# Patient Record
Sex: Male | Born: 1970 | Race: White | Hispanic: No | Marital: Married | Smoking: Former smoker
Health system: Southern US, Community
[De-identification: ages and names within clinical notes are randomized; demographics above are authoritative.]

## PROBLEM LIST (undated history)

## (undated) DIAGNOSIS — I10 Essential (primary) hypertension: Secondary | ICD-10-CM

## (undated) DIAGNOSIS — F419 Anxiety disorder, unspecified: Secondary | ICD-10-CM

## (undated) DIAGNOSIS — M1990 Unspecified osteoarthritis, unspecified site: Secondary | ICD-10-CM

## (undated) DIAGNOSIS — E785 Hyperlipidemia, unspecified: Secondary | ICD-10-CM

## (undated) DIAGNOSIS — G4730 Sleep apnea, unspecified: Secondary | ICD-10-CM

## (undated) DIAGNOSIS — F909 Attention-deficit hyperactivity disorder, unspecified type: Secondary | ICD-10-CM

## (undated) DIAGNOSIS — F32A Depression, unspecified: Secondary | ICD-10-CM

## (undated) HISTORY — DX: Essential (primary) hypertension: I10

## (undated) HISTORY — PX: APPENDECTOMY: SHX54

## (undated) HISTORY — DX: Unspecified osteoarthritis, unspecified site: M19.90

## (undated) HISTORY — DX: Sleep apnea, unspecified: G47.30

## (undated) HISTORY — DX: Hyperlipidemia, unspecified: E78.5

## (undated) HISTORY — DX: Depression, unspecified: F32.A

## (undated) HISTORY — DX: Anxiety disorder, unspecified: F41.9

## (undated) HISTORY — DX: Attention-deficit hyperactivity disorder, unspecified type: F90.9

## (undated) HISTORY — PX: OTHER SURGICAL HISTORY: SHX169

---

## 2008-10-15 HISTORY — PX: MENISCUS REPAIR: SHX5179

## 2012-02-26 ENCOUNTER — Ambulatory Visit (INDEPENDENT_AMBULATORY_CARE_PROVIDER_SITE_OTHER): Payer: BC Managed Care – PPO | Admitting: Family Medicine

## 2012-02-26 ENCOUNTER — Encounter: Payer: Self-pay | Admitting: Family Medicine

## 2012-02-26 VITALS — BP 130/86 | HR 97 | Ht 72.0 in | Wt 274.0 lb

## 2012-02-26 DIAGNOSIS — F411 Generalized anxiety disorder: Secondary | ICD-10-CM

## 2012-02-26 DIAGNOSIS — F419 Anxiety disorder, unspecified: Secondary | ICD-10-CM

## 2012-02-26 DIAGNOSIS — IMO0001 Reserved for inherently not codable concepts without codable children: Secondary | ICD-10-CM

## 2012-02-26 DIAGNOSIS — R030 Elevated blood-pressure reading, without diagnosis of hypertension: Secondary | ICD-10-CM

## 2012-02-26 DIAGNOSIS — I10 Essential (primary) hypertension: Secondary | ICD-10-CM | POA: Insufficient documentation

## 2012-02-26 MED ORDER — SERTRALINE HCL 50 MG PO TABS: 50.0000 mg | ORAL_TABLET | Freq: Every day | ORAL | Status: DC

## 2012-02-26 NOTE — Patient Instructions (Addendum)
Sertraline - Start 1/2 tab daily for one week.  Then increase to whole tab. DASH Diet The DASH diet stands for "Dietary Approaches to Stop Hypertension." It is a healthy eating plan that has been shown to reduce high blood pressure (hypertension) in as little as 14 days, while also possibly providing other significant health benefits. These other health benefits include reducing the risk of breast cancer after menopause and reducing the risk of type 2 diabetes, heart disease, colon cancer, and stroke. Health benefits also include weight loss and slowing kidney failure in patients with chronic kidney disease.   DIET GUIDELINES  Limit salt (sodium). Your diet should contain less than 1500 mg of sodium daily.   Limit refined or processed carbohydrates. Your diet should include mostly whole grains. Desserts and added sugars should be used sparingly.   Include small amounts of heart-healthy fats. These types of fats include nuts, oils, and tub margarine. Limit saturated and trans fats. These fats have been shown to be harmful in the body.  CHOOSING FOODS   The following food groups are based on a 2000 calorie diet. See your Registered Dietitian for individual calorie needs. Grains and Grain Products (6 to 8 servings daily)  Eat More Often: Whole-wheat bread, brown rice, whole-grain or wheat pasta, quinoa, popcorn without added fat or salt (air popped).   Eat Less Often: White bread, white pasta, white rice, cornbread.  Vegetables (4 to 5 servings daily)  Eat More Often: Fresh, frozen, and canned vegetables. Vegetables may be raw, steamed, roasted, or grilled with a minimal amount of fat.   Eat Less Often/Avoid: Creamed or fried vegetables. Vegetables in a cheese sauce.  Fruit (4 to 5 servings daily)  Eat More Often: All fresh, canned (in natural juice), or frozen fruits. Dried fruits without added sugar. One hundred percent fruit juice ( cup [237 mL] daily).   Eat Less Often: Dried fruits with  added sugar. Canned fruit in light or heavy syrup.  [employer], Fish, and Poultry (2 servings or less daily. One serving is 3 to 4 oz [85-114 g]).  Eat More Often: Ninety percent or leaner ground beef, tenderloin, sirloin. Round cuts of beef, chicken breast, [place] breast. All fish. Grill, bake, or broil your meat. Nothing should be fried.   Eat Less Often/Avoid: Fatty cuts of meat, [place], or chicken leg, thigh, or wing. Fried cuts of meat or fish.  Dairy (2 to 3 servings)  Eat More Often: Low-fat or fat-free milk, low-fat plain or light yogurt, reduced-fat or part-skim cheese.   Eat Less Often/Avoid: Milk (whole, 2%, skim, or chocolate). Whole milk yogurt. Full-fat cheeses.  Nuts, Seeds, and Legumes (4 to 5 servings per week)  Eat More Often: All without added salt.   Eat Less Often/Avoid: Salted nuts and seeds, canned beans with added salt.  Fats and Sweets (limited)  Eat More Often: Vegetable oils, tub margarines without trans fats, sugar-free gelatin. Mayonnaise and salad dressings.   Eat Less Often/Avoid: Coconut oils, palm oils, butter, stick margarine, cream, half and half, cookies, candy, pie.  FOR MORE INFORMATION The Dash Diet Eating Plan: www.dashdiet.org Document Released: 09/20/2011 Document Reviewed: 09/10/2011 [facility] Patient Information 2012 [place], [place].

## 2012-02-26 NOTE — Progress Notes (Signed)
Subjective:    Patient ID: Stephen Orozco, male    DOB: Mar 11, 1971, 41 y.o.   MRN: [id number]  HPI Had concerns over BP. Had high BP for a period of time and was on meds and it came down and finally got better.  Had a CPE at work.  BP was 116/82.  Then went to PT and felt fine and rechecked BP 187/110 on the way out.  Waited 5 min and DBP was 100.  Then 30 min later dropped to 98.  Had one episode of CP and went to ED and it was anxiety about in 2009, o/w not recent CP or SOB.  Not working out regularly. occ goes on hikes. He admits his diet is poor. He's not getting any regular exercise. Though, he feels he does eat a low-salt diet.  Anxiety - Uses Buspar prn for this.  Used to be on fluoxetine, but stopped it after he felt he was gaining weight on it.  Off now and has been more irritable.  Used to have panic attacks in college. His family has encouraged him to come see the doctor.  Review of Systems  Constitutional: Negative for fever, diaphoresis and unexpected weight change.       Weakness  HENT: Negative for hearing loss, rhinorrhea, sneezing and tinnitus.   Eyes: Negative for visual disturbance.  Respiratory: Negative for cough and wheezing.   Cardiovascular: Negative for chest pain and palpitations.  Gastrointestinal: Negative for nausea, vomiting, diarrhea and blood in stool.  Genitourinary: Negative for dysuria and discharge.       + nausea, vomiting , diarrhea  Musculoskeletal: Positive for myalgias and arthralgias.  Skin: Negative for rash.  Neurological: Negative for headaches.  Hematological: Negative for adenopathy.  Psychiatric/Behavioral: Positive for dysphoric mood. Negative for sleep disturbance. The patient is not nervous/anxious.    BP 130/86  Pulse 97  Ht 6' (1.829 m)  Wt 274 lb (124.286 kg)  BMI 37.16 kg/m2    Allergies not on file  Past Medical History  Diagnosis Date  . Hypertension   . Hyperlipidemia     Past Surgical History  Procedure Date  .  Appendectomy   . Medial malleolar fixation     History   Social History  . Marital Status: Married    Spouse Name: N/A    Number of Children: N/A  . Years of Education: N/A   Occupational History  . Not on file.   Social History Main Topics  . Smoking status: Current Some Day Smoker -- 0.0 packs/day for 10 years    Types: Cigarettes  . Smokeless tobacco: Not on file  . Alcohol Use: 1.0 - 1.5 oz/week    2-3 drink(s) per week  . Drug Use: Not on file  . Sexually Active: Not on file   Other Topics Concern  . Not on file   Social History Narrative  . No narrative on file    Family History  Problem Relation Age of Onset  . Alcohol abuse Father   . Cancer    . Heart attack    . Depression    . Diabetes Father   . Hyperlipidemia Father   . Hypertension Father     Outpatient Encounter Prescriptions as of 02/26/2012  Medication Sig Dispense Refill  . busPIRone (BUSPAR) 15 MG tablet Take 15 mg by mouth as needed.      . Multiple Vitamin (MULTIVITAMIN) tablet Take 1 tablet by mouth daily.      . sertraline (  ZOLOFT) 50 MG tablet Take 1 tablet (50 mg total) by mouth daily.  30 tablet  1          Objective:   Physical Exam  Constitutional: He is oriented to person, place, and time. He appears well-developed and well-nourished.  HENT:  Head: Normocephalic and atraumatic.  Cardiovascular: Normal rate, regular rhythm and normal heart sounds.        No carotid bruits. Radial pulses 2+ bilat.   Pulmonary/Chest: Effort normal and breath sounds normal.  Musculoskeletal: He exhibits no edema.  Neurological: He is alert and oriented to person, place, and time.  Skin: Skin is warm and dry.  Psychiatric: He has a normal mood and affect. His behavior is normal.          Assessment & Plan:  Elevated BP - Work on diet and exercise. Will schedule for a stress test.  May just be deconditioning vs. underlying heart disease. Discussed could start a BP med or just  Monitor it  and work on diet and exericse.  F/U in 1 months. Patient agrees to care plan. BP is at goal today. Work on low salt diet. H.O on DASH diet given.   Anxiety - Discussed options. Would like to try sertraline. F/U in 4 weeks. GAD-7 score 9. PHQ-9 score of 15. Discussed potential SE. Call if any concerns.

## 2012-02-29 LAB — COMPLETE METABOLIC PANEL WITH GFR
AST: 32 U/L (ref 0–37)
Albumin: 4.8 g/dL (ref 3.5–5.2)
Alkaline Phosphatase: 56 U/L (ref 39–117)
BUN: 15 mg/dL (ref 6–23)
Potassium: 4.7 mEq/L (ref 3.5–5.3)
Sodium: 140 mEq/L (ref 135–145)

## 2012-02-29 LAB — LIPID PANEL
Cholesterol: 273 mg/dL — ABNORMAL HIGH (ref 0–200)
LDL Cholesterol: 186 mg/dL — ABNORMAL HIGH (ref 0–99)
Total CHOL/HDL Ratio: 7.6 Ratio
VLDL: 51 mg/dL — ABNORMAL HIGH (ref 0–40)

## 2012-03-02 ENCOUNTER — Other Ambulatory Visit: Payer: Self-pay | Admitting: Family Medicine

## 2012-03-02 MED ORDER — PRAVASTATIN SODIUM 40 MG PO TABS: 40.0000 mg | ORAL_TABLET | Freq: Every day | ORAL | Status: DC

## 2012-04-22 ENCOUNTER — Encounter: Payer: Self-pay | Admitting: Family Medicine

## 2012-04-22 ENCOUNTER — Ambulatory Visit (INDEPENDENT_AMBULATORY_CARE_PROVIDER_SITE_OTHER): Payer: BC Managed Care – PPO | Admitting: Family Medicine

## 2012-04-22 VITALS — BP 137/94 | HR 79 | Ht 72.0 in | Wt 269.0 lb

## 2012-04-22 DIAGNOSIS — Z23 Encounter for immunization: Secondary | ICD-10-CM

## 2012-04-22 DIAGNOSIS — I10 Essential (primary) hypertension: Secondary | ICD-10-CM

## 2012-04-22 DIAGNOSIS — F419 Anxiety disorder, unspecified: Secondary | ICD-10-CM

## 2012-04-22 DIAGNOSIS — E669 Obesity, unspecified: Secondary | ICD-10-CM

## 2012-04-22 DIAGNOSIS — F411 Generalized anxiety disorder: Secondary | ICD-10-CM

## 2012-04-22 DIAGNOSIS — IMO0001 Reserved for inherently not codable concepts without codable children: Secondary | ICD-10-CM

## 2012-04-22 DIAGNOSIS — J019 Acute sinusitis, unspecified: Secondary | ICD-10-CM

## 2012-04-22 DIAGNOSIS — E785 Hyperlipidemia, unspecified: Secondary | ICD-10-CM

## 2012-04-22 MED ORDER — LISINOPRIL 10 MG PO TABS: 10.0000 mg | ORAL_TABLET | Freq: Every day | ORAL | Status: DC

## 2012-04-22 MED ORDER — AMOXICILLIN 875 MG PO TABS: 875.0000 mg | ORAL_TABLET | Freq: Two times a day (BID) | ORAL | Status: AC

## 2012-04-22 MED ORDER — SERTRALINE HCL 100 MG PO TABS: 100.0000 mg | ORAL_TABLET | Freq: Every day | ORAL | Status: DC

## 2012-04-22 NOTE — Progress Notes (Signed)
  Subjective:    Patient ID: Stephen Orozco, male    DOB: [birth year], 41 y.o.   MRN: [id number]  HPI HTN- Has been working out regularly.  Has lost 5 lbs. Says was really working on the diet.  Says admits didn't  Stick to it over this last holiday weekend.  No CP or SOB.   2-3 days of HA adn sinus pressure on the left side of head. No cough.  No fever.  Sneezing.  Constant HA for 2 days.  Took Tylenol multi-sxs.  Says helped a little.  Not sleeping well. Hx of seasonal allergies. Not on any allergy meds. No worsening sxs.   Anxiety - Says not sure healping much .  No S.E.  he says his family members have told him such as well. Otherwise he is tolerating it well. He still complains of feeling irritable and easily.   Review of Systems     Objective:   Physical Exam  Constitutional: He is oriented to person, place, and time. He appears well-developed and well-nourished.  HENT:  Head: Normocephalic and atraumatic.  Right Ear: External ear normal.  Left Ear: External ear normal.  Nose: Nose normal.  Mouth/Throat: Oropharynx is clear and moist.       TMs and canals are clear. Tender over the left forehead adn left maxillary.   Eyes: Conjunctivae and EOM are normal. Pupils are equal, round, and reactive to light.  Neck: Neck supple. No thyromegaly present.  Cardiovascular: Normal rate and normal heart sounds.   Pulmonary/Chest: Effort normal and breath sounds normal.  Lymphadenopathy:    He has no cervical adenopathy.  Neurological: He is alert and oriented to person, place, and time.  Skin: Skin is warm and dry.  Psychiatric: He has a normal mood and affect.          Assessment & Plan:  HTN - new diagnosis. He says he is already reviewed the DASH diet and says his prematurity following that. That he would like additional information on dietary plan. Start lisinopril 10 mg. 1 potential side effects. Followup in 4-6 weeks to make sure well controlled and doing well. We will check a  BMP at that point in time.  Acute sinusitis - Will tx with amox.  Call if not better in one week. Cont symptomtatic care. Make sure to start anti histamine for allergies as well.   Anxiety -GAD-7 score of 9.  Discussed options.  Will increase sertraline to 100mg  daily. F/U in 6 weeks. If nto at goal consider changing meds.   Obesity - he has lost 4 pounds and is on fantastic. Continue work on regular exercise and diet. 11 him to continue to lose weight by the time I  see him back.  Hyperlipidemia-tolerating.  No well side effects or myalgias. Given lab slip today for the next couple weeks to recheck FLP.   Tdap given today.

## 2012-04-22 NOTE — Patient Instructions (Addendum)
Sinusitis Sinuses are air pockets within the bones of your face. The growth of bacteria within a sinus leads to infection. The infection prevents the sinuses from draining. This infection is called sinusitis. SYMPTOMS   There will be different areas of pain depending on which sinuses have become infected.  The maxillary sinuses often produce pain beneath the eyes.   Frontal sinusitis may cause pain in the middle of the forehead and above the eyes.  Other problems (symptoms) include:  Toothaches.   Colored, pus-like (purulent) drainage from the nose.   Swelling, warmth, and tenderness over the sinus areas may be signs of infection.  TREATMENT   Sinusitis is most often determined by an exam.X-rays may be taken. If x-rays have been taken, make sure you obtain your results or find out how you are to obtain them. Your caregiver may give you medications (antibiotics). These are medications that will help kill the bacteria causing the infection. You may also be given a medication (decongestant) that helps to reduce sinus swelling.   HOME CARE INSTRUCTIONS    Only take over-the-counter or prescription medicines for pain, discomfort, or fever as directed by your caregiver.   Drink extra fluids. Fluids help thin the mucus so your sinuses can drain more easily.   Applying either moist heat or ice packs to the sinus areas may help relieve discomfort.   Use saline nasal sprays to help moisten your sinuses. The sprays can be found at your local drugstore.  SEEK IMMEDIATE MEDICAL CARE IF:  You have a fever.   You have increasing pain, severe headaches, or toothache.   You have nausea, vomiting, or drowsiness.   You develop unusual swelling around the face or trouble seeing.  MAKE SURE YOU:    Understand these instructions.   Will watch your condition.   Will get help right away if you are not doing well or get worse.  Document Released: 10/01/2005 Document Revised: 09/20/2011 Document  Reviewed: 04/30/2007 [employer] Patient Information 2012 [employer].1800 Calorie Diabetic Diet The 1800 calorie diabetic diet is designed for eating up to 1800 calories each day. Following this diet and making healthy meal choices can help improve overall health. It controls blood glucose (sugar) levels, and it can also help lower blood pressure and cholesterol. SERVING SIZES Measuring foods and serving sizes helps to make sure you are getting the right amount of food. The list below tells how big or small some common serving sizes are:  1 oz.........4 stacked dice.   3 oz........Marland KitchenDeck of cards.   1 tsp.......Marland KitchenTip of little finger.   1 tbs......Marland KitchenMarland KitchenThumb.   2 tbs.......Marland KitchenGolf ball.    cup......Marland KitchenHalf of a fist.   1 cup.......Marland KitchenA fist.  GUIDELINES FOR CHOOSING FOODS The goal of this diet is to eat a variety of foods and limit calories to 1800 each day. This can be done by choosing foods that are low in calories and fat. The diet also suggests eating small amounts of food frequently. Doing this helps control your blood glucose levels so they do not get too high or too low. Each meal or snack may include a protein food source to help you feel more satisfied. Try to eat about the same amount of food around the same time each day. This includes weekend days, travel days, and days off work. Space your meals about 4 to 5 hours apart, and add a snack between them, if you wish.   For example, a daily food plan could include breakfast, a morning  snack, lunch, dinner, and an evening snack. Healthy meals and snacks have different types of foods, including whole grains, vegetables, fruits, lean meats, poultry, fish, and dairy products. As you plan your meals, select a variety of foods. Choose from the bread and starch, vegetable, fruit, dairy, and meat/protein groups. Examples of foods from each group are listed below with their suggested serving sizes. Use measuring cups and spoons to become familiar with  what a healthy portion looks like. Bread and Starch Each serving equals 15 grams of carbohydrates.  1 slice bread.    bagel.    cup cold cereal (unsweetened).    cup hot cereal or mashed potatoes.   1 small potato (size of a computer mouse).   ? cup cooked pasta or rice.    English muffin.   1 cup broth-based soup.   3 cups of popcorn.   4 to 6 whole-wheat crackers.    cup cooked beans, peas, or corn.  Vegetables Each serving equals 5 grams of carbohydrates.   cup cooked vegetables.   1 cup raw vegetables.    cup tomato or vegetable juice.  Fruit Each serving equals 15 grams of carbohydrates.  1 small apple or orange.   1  cup watermelon or strawberries.    cup applesauce (no sugar added).   2 tbs raisins.    banana.    cup canned fruit, packed in water or in its own juice.    cup unsweetened fruit juice.  Dairy Each serving equals 12 to 15 grams of carbohydrates.  1 cup fat-free milk.   6 oz artificially sweetened yogurt or plain yogurt.   1 cup low-fat buttermilk.   1 cup soy milk.   1 cup almond milk.  Meat/Protein  1 large egg.   2 to 3 oz meat, poultry, or fish.    cup low-fat cottage cheese.   1 tbs peanut butter.   1 oz low-fat cheese.    cup tuna, packed in water.    cup tofu.  Fat  1 tsp oil.   1 tsp trans-fat-free margarine.   1 tsp butter.   1 tsp mayonnaise.   2 tbs avocado.   1 tbs salad dressing.   1 tbs cream cheese.   2 tbs sour cream.  SAMPLE 1800 CALORIE DIET PLAN Breakfast   cup unsweetened cereal (1 carb serving).   1 cup fat-free milk (1 carb serving).   1 slice whole-wheat toast (1 carb serving).    small banana (1 carb serving).   1 scrambled egg.   1 tsp trans-fat-free margarine.  Lunch  Tuna sandwich.   2 slices whole-wheat bread (2 carb servings).    cup canned tuna in water, drained.   1 tbs reduced fat mayonnaise.   1 stalk celery, chopped.   2 slices  tomato.   1 lettuce leaf.   1 cup carrot sticks.   24 to 30 seedless grapes (2 carb servings).   6 oz light yogurt (1 carb serving).  Afternoon Snack  3 graham cracker squares (1 carb serving).   1 cup fat-free milk (1 carb serving).   1 tbs peanut butter.  Dinner  3 oz salmon, broiled with 1 tsp oil.   1 cup mashed potatoes (2 carb servings) with 1 tsp trans-fat-free margarine.   1 cup fresh or frozen green beans.   1 cup steamed asparagus.   1 cup fat-free milk (1 carb serving).  Evening Snack  3 cups of air-popped popcorn (1  carb serving).   2 tbs Parmesan cheese.  Meal Plan You can use this worksheet to help you make a daily meal plan based on the 1800 calorie diabetic diet suggestions. If you are using this plan to help you control your blood glucose, you may interchange carbohydrate-containing foods (dairy, starches, and fruits). Select a variety of fresh foods of varying colors and flavors. The total amount of carbohydrate in your meals or snacks is more important than making sure you include all of the food groups every time you eat. Choose from the approximate amount of the following foods to build your day's meals:  8 Starches.   4 Vegetables.   3 Fruits.   2 Dairy.   6 to 7 oz Meat/Protein.   Up to 4 Fats.  Your dietician can use this worksheet to help you decide how many servings and which types of foods are right for you. BREAKFAST Food Group and Servings / Food Choice Starches _______________________________________________________ Dairy __________________________________________________________ Fruit ___________________________________________________________ Meat/Protein ____________________________________________________ Fat ____________________________________________________________ LUNCH Food Group and Servings / Food Choice Starch _________________________________________________________ Meat/Protein  ___________________________________________________ Vegetables _____________________________________________________ Fruit __________________________________________________________ Dairy __________________________________________________________ Fat ____________________________________________________________ Aura Fey Food Group and Servings / Food Choice Starch ________________________________________________________ Meat/Protein ___________________________________________________ Fruit __________________________________________________________ Dairy __________________________________________________________ Laural Golden Food Group and Servings / Food Choice Starches _______________________________________________________ Meat/Protein ___________________________________________________ Dairy __________________________________________________________ Vegetable ______________________________________________________ Fruit ___________________________________________________________ Fat ____________________________________________________________ Lollie Sails Food Group and Servings / Food Choice Fruit __________________________________________________________ Meat/Protein ___________________________________________________ Dairy __________________________________________________________ Starch _________________________________________________________ DAILY TOTALS Starches _________________________ Vegetables _______________________ Fruits ____________________________ Dairy ____________________________ Meat/Protein_____________________ Fats _____________________________ Document Released: 04/23/2005 Document Revised: 09/20/2011 Document Reviewed: 08/17/2011 [employer] Patient Information 2012 [place], [place].

## 2012-04-29 ENCOUNTER — Other Ambulatory Visit: Payer: Self-pay | Admitting: Family Medicine

## 2012-06-03 ENCOUNTER — Ambulatory Visit (INDEPENDENT_AMBULATORY_CARE_PROVIDER_SITE_OTHER): Payer: BC Managed Care – PPO | Admitting: Family Medicine

## 2012-06-03 ENCOUNTER — Encounter: Payer: Self-pay | Admitting: Family Medicine

## 2012-06-03 VITALS — BP 122/86 | HR 80 | Ht 72.0 in | Wt 264.0 lb

## 2012-06-03 DIAGNOSIS — Z23 Encounter for immunization: Secondary | ICD-10-CM

## 2012-06-03 DIAGNOSIS — I10 Essential (primary) hypertension: Secondary | ICD-10-CM

## 2012-06-03 DIAGNOSIS — Z720 Tobacco use: Secondary | ICD-10-CM

## 2012-06-03 DIAGNOSIS — F17200 Nicotine dependence, unspecified, uncomplicated: Secondary | ICD-10-CM

## 2012-06-03 DIAGNOSIS — F411 Generalized anxiety disorder: Secondary | ICD-10-CM

## 2012-06-03 NOTE — Progress Notes (Signed)
  Subjective:    Patient ID: Stephen Orozco, male    DOB: [birth year], 41 y.o.   MRN: [id number]  HPI GAD - Doing well. Has noticed some  Dec sexual interested. Says still feels having food cravings. Has had long hours of work this week and it has been stressful.    HTN- no CP or SOB.  Occ smokes but not daily.   Review of Systems     Objective:   Physical Exam  Constitutional: He is oriented to person, place, and time. He appears well-developed and well-nourished.  HENT:  Head: Normocephalic and atraumatic.  Cardiovascular: Normal rate, regular rhythm and normal heart sounds.   Pulmonary/Chest: Effort normal and breath sounds normal.  Neurological: He is alert and oriented to person, place, and time.  Skin: Skin is warm and dry.  Psychiatric: He has a normal mood and affect. His behavior is normal.          Assessment & Plan:  GAD - GAD score of 6 today.  Doing well overall except for sexual S.E and in appetite. He says he would like to continue to work on it for 2-3 months and then we will regroup and decide if going to change the med or not.    HTN - BP well controlled. Continue current regimen.   Obesity - Has lost 5 more lbs. Great work. Keep it up.   Tob abuse - recommend cessation. Given pneumonia vaccine today.

## 2012-06-03 NOTE — Addendum Note (Signed)
Addended by: Wyline Beady on: 06/03/2012 09:46 AM   Modules accepted: Orders

## 2012-06-23 ENCOUNTER — Other Ambulatory Visit: Payer: Self-pay | Admitting: Family Medicine

## 2012-06-30 ENCOUNTER — Other Ambulatory Visit: Payer: Self-pay | Admitting: Family Medicine

## 2012-07-08 ENCOUNTER — Other Ambulatory Visit: Payer: Self-pay | Admitting: Family Medicine

## 2012-07-28 ENCOUNTER — Other Ambulatory Visit: Payer: Self-pay | Admitting: Family Medicine

## 2012-09-08 ENCOUNTER — Encounter: Payer: Self-pay | Admitting: Family Medicine

## 2012-09-08 ENCOUNTER — Ambulatory Visit (INDEPENDENT_AMBULATORY_CARE_PROVIDER_SITE_OTHER): Payer: BC Managed Care – PPO | Admitting: Family Medicine

## 2012-09-08 VITALS — BP 128/89 | HR 80 | Ht 72.0 in | Wt 263.0 lb

## 2012-09-08 DIAGNOSIS — F411 Generalized anxiety disorder: Secondary | ICD-10-CM

## 2012-09-08 MED ORDER — BUPROPION HCL ER (XL) 150 MG PO TB24: 150.0000 mg | ORAL_TABLET | Freq: Every day | ORAL | Status: DC

## 2012-09-08 NOTE — Patient Instructions (Signed)
Cut sertraline in half. Take half tab daily for one week.  The quarter tab daily for one week then stop and start the wellbutrin.

## 2012-09-08 NOTE — Progress Notes (Signed)
  Subjective:    Patient ID: Boots Valera, male    DOB: [birth year], 41 y.o.   MRN: [id number]  HPI Anxiety - did well on wellbutrin in the possible.  Says still using sertraline and cuasing some sexual S.E.   Thinks may have had the flu last week. He had a fever for 3 days myalgias and cough. He's feeling completely better now. Cough is resolved. Fevers resolved. He stay out of work.   Review of Systems     Objective:   Physical Exam  Constitutional: He is oriented to person, place, and time. He appears well-developed and well-nourished.  HENT:  Head: Normocephalic and atraumatic.  Cardiovascular: Normal rate, regular rhythm and normal heart sounds.   Pulmonary/Chest: Effort normal and breath sounds normal.  Neurological: He is alert and oriented to person, place, and time.  Skin: Skin is warm and dry.  Psychiatric: He has a normal mood and affect. His behavior is normal.          Assessment & Plan:  Anxiety - GAD 7 score of 6. Doing well overall except for sexual side effects. At this point time we'll wean down sertraline and start Wellbutrin. He says he took in college and did well on it. This will hopefully resolve his sexual side effect concerns. Followup in 8 weeks.  URI-resolved. Lung exam is normal today.

## 2012-11-03 ENCOUNTER — Ambulatory Visit (INDEPENDENT_AMBULATORY_CARE_PROVIDER_SITE_OTHER): Payer: BC Managed Care – PPO | Admitting: Family Medicine

## 2012-11-03 ENCOUNTER — Encounter: Payer: Self-pay | Admitting: Family Medicine

## 2012-11-03 VITALS — BP 120/83 | HR 82 | Resp 16 | Wt 266.0 lb

## 2012-11-03 DIAGNOSIS — F411 Generalized anxiety disorder: Secondary | ICD-10-CM

## 2012-11-03 DIAGNOSIS — E785 Hyperlipidemia, unspecified: Secondary | ICD-10-CM

## 2012-11-03 DIAGNOSIS — I10 Essential (primary) hypertension: Secondary | ICD-10-CM

## 2012-11-03 LAB — LIPID PANEL
HDL: 36 mg/dL — ABNORMAL LOW (ref >39)
LDL Cholesterol: 129 mg/dL — ABNORMAL HIGH (ref 0–99)

## 2012-11-03 LAB — COMPLETE METABOLIC PANEL WITH GFR
CO2: 27 mEq/L (ref 19–32)
Calcium: 9.7 mg/dL (ref 8.4–10.5)
Chloride: 102 mEq/L (ref 96–112)
GFR, Est African American: >89 mL/min
GFR, Est Non African American: >89 mL/min
Glucose, Bld: 102 mg/dL — ABNORMAL HIGH (ref 70–99)
Sodium: 138 mEq/L (ref 135–145)
Total Bilirubin: 0.4 mg/dL (ref 0.3–1.2)
Total Protein: 7.4 g/dL (ref 6.0–8.3)

## 2012-11-03 MED ORDER — BUPROPION HCL ER (XL) 300 MG PO TB24: 300.0000 mg | ORAL_TABLET | ORAL | Status: DC

## 2012-11-03 MED ORDER — PRAVASTATIN SODIUM 40 MG PO TABS: 40.0000 mg | ORAL_TABLET | Freq: Every day | ORAL | Status: DC

## 2012-11-03 NOTE — Progress Notes (Signed)
  Subjective:    Patient ID: Stephen Orozco, male    DOB: 1971/03/07, 42 y.o.   MRN: [id number]  HPI  Anxiety - Felt more emotional over the first 2 weeks with the Wellbutrin. He noticed he was tearful at movies [employer]. Says does feel better on it. Overall. He was able to get through the anniversary of his father's death fairly well. He admits he still been very anxious and irritable at times. He occasionally uses BuSpar on a when necessary basis but not daily.  HTN  -  Pt denies chest pain, SOB, dizziness, or heart palpitations.  Denies medication side effects.  Stopped his lisinopril a month ago.   Hyperlipidemia-he ran out of his statin as well. He denies any side effects on the medication. Review of Systems     Objective:   Physical Exam  Constitutional: He is oriented to person, place, and time. He appears well-developed and well-nourished.  HENT:  Head: Normocephalic and atraumatic.  Cardiovascular: Normal rate, regular rhythm and normal heart sounds.   Pulmonary/Chest: Effort normal and breath sounds normal.  Neurological: He is alert and oriented to person, place, and time.  Skin: Skin is warm and dry.  Psychiatric: He has a normal mood and affect. His behavior is normal.          Assessment & Plan:  Anxiety - GAD-7 score of 9 . We discussed her options including sitting with the Wellbutrin increasing his dose or possibly changing to a different medication. He had taken Wellbutrin 10+ years ago had gone well on it at one point in time. After much discussion we decided to Increase Wellbutrin to 300mg .  F/u in 4 weeks. If still feels med is not working well, will change.   HTN - well controlled off medications. Continue current regimen. Overdue for CMP and lipids.He didn't go last time. We will follow his pressures over time off the medication. Assessment and sure getting back on exercise and diet routine and working on weight loss. Exercise will help his anxiety as  well.  HyperLipidemia-he's been off his statin for a month or so as well. Encouraged him to restart it. The prescription sent to pharmacy.

## 2012-12-01 ENCOUNTER — Encounter: Payer: Self-pay | Admitting: Family Medicine

## 2012-12-01 ENCOUNTER — Ambulatory Visit (INDEPENDENT_AMBULATORY_CARE_PROVIDER_SITE_OTHER): Payer: BC Managed Care – PPO | Admitting: Family Medicine

## 2012-12-01 VITALS — BP 117/80 | HR 86 | Ht 72.0 in | Wt 269.0 lb

## 2012-12-01 DIAGNOSIS — Z23 Encounter for immunization: Secondary | ICD-10-CM

## 2012-12-01 DIAGNOSIS — F17200 Nicotine dependence, unspecified, uncomplicated: Secondary | ICD-10-CM

## 2012-12-01 DIAGNOSIS — F411 Generalized anxiety disorder: Secondary | ICD-10-CM

## 2012-12-01 DIAGNOSIS — Z720 Tobacco use: Secondary | ICD-10-CM

## 2012-12-01 NOTE — Progress Notes (Signed)
  Subjective:    Patient ID: Stephen Orozco, male    DOB: 1971/03/01, 42 y.o.   MRN: [id number]  HPI GAD- Says felt rough when we first bumped his dose on the Wellbutrin.  He says his depression sxs are better. Still occ irritable.  He has been starting to work out.  Has been gaining some weight too and not sure if from the medication.  Sleep is fair.  Still smoking but has cut back.  Tolerating current medication well.    Review of Systems     Objective:   Physical Exam  Constitutional: He is oriented to person, place, and time. He appears well-developed and well-nourished.  HENT:  Head: Normocephalic and atraumatic.  Cardiovascular: Normal rate, regular rhythm and normal heart sounds.   Pulmonary/Chest: Effort normal and breath sounds normal.  Neurological: He is alert and oriented to person, place, and time.  Skin: Skin is warm and dry.  Psychiatric: He has a normal mood and affect. His behavior is normal.          Assessment & Plan:  GAD - GAD- 7 score of 5. Improved so will continue current regimen.  Continue to work on regular exercise and healthy diet.    Tob abuse - Says has been able to cut back on the Wellbutrin.

## 2013-02-18 ENCOUNTER — Other Ambulatory Visit: Payer: Self-pay | Admitting: Family Medicine

## 2013-03-02 ENCOUNTER — Encounter: Payer: Self-pay | Admitting: Family Medicine

## 2013-03-02 ENCOUNTER — Ambulatory Visit (INDEPENDENT_AMBULATORY_CARE_PROVIDER_SITE_OTHER): Payer: BC Managed Care – PPO | Admitting: Family Medicine

## 2013-03-02 VITALS — BP 125/95 | HR 89 | Wt 266.0 lb

## 2013-03-02 DIAGNOSIS — F411 Generalized anxiety disorder: Secondary | ICD-10-CM

## 2013-03-02 DIAGNOSIS — I10 Essential (primary) hypertension: Secondary | ICD-10-CM

## 2013-03-02 NOTE — Progress Notes (Signed)
       Subjective:    Patient ID: Stephen Orozco, male    DOB: Apr 22, 1971, 42 y.o.   MRN: [id number]  HPI Anxiety - doing well.  Has been tyring to get to the gym.  Has been trying to get home earlier and spending more time with the kids. Tolreating medications well w/out any S.E.    HTN -  Pt denies chest pain, SOB, dizziness, or heart palpitations.  Taking meds as directed w/o problems.  Denies medication side effects.  No change in salt intake. He's been trying to get to the gym some. He really overall has been trying to eat healthy but still having some difficulty with weight loss.    Review of Systems     Objective:   Physical Exam  Constitutional: He is oriented to person, place, and time. He appears well-developed and well-nourished.  HENT:  Head: Normocephalic and atraumatic.  Cardiovascular: Normal rate, regular rhythm and normal heart sounds.   Pulmonary/Chest: Effort normal and breath sounds normal.  Neurological: He is alert and oriented to person, place, and time.  Skin: Skin is warm and dry.  Psychiatric: He has a normal mood and affect. His behavior is normal.          Assessment & Plan:  Anxiety - GAD- 7 score of 3.  Happy with current regimen.  F/U in 6 months.     HTN - Uncontrolled today. He is asymptomatic. His blood pressures if looks fantastic the last couple of office visits until today. He says they have been running a little bit higher the last 2 weeks home. I would like him to continue to monitor them for about 2 more weeks and if they don't come back down he will call the office back and we'll start him on a blood pressure medication. We can always restart what he was on previously.

## 2013-03-02 NOTE — Patient Instructions (Signed)
Call me if your blood pressure is still running high in 2 weeks.

## 2013-05-01 ENCOUNTER — Telehealth: Payer: Self-pay | Admitting: *Deleted

## 2013-05-01 MED ORDER — LISINOPRIL 10 MG PO TABS: ORAL_TABLET | ORAL | Status: DC

## 2013-05-01 NOTE — Telephone Encounter (Signed)
rx sent

## 2013-05-01 NOTE — Telephone Encounter (Signed)
Pt left a message stating that for a while his bp looked great but over the last couple weeks its's been steadily increasing & he would like [name] to send over an rx for whatever he was taking before.

## 2013-05-04 NOTE — Telephone Encounter (Signed)
LMOM notifying pt of rx. 

## 2013-05-28 ENCOUNTER — Other Ambulatory Visit: Payer: Self-pay | Admitting: Family Medicine

## 2013-05-28 ENCOUNTER — Ambulatory Visit (INDEPENDENT_AMBULATORY_CARE_PROVIDER_SITE_OTHER): Payer: BC Managed Care – PPO | Admitting: Family Medicine

## 2013-05-28 ENCOUNTER — Ambulatory Visit (INDEPENDENT_AMBULATORY_CARE_PROVIDER_SITE_OTHER): Payer: BC Managed Care – PPO

## 2013-05-28 ENCOUNTER — Encounter: Payer: Self-pay | Admitting: Family Medicine

## 2013-05-28 VITALS — BP 125/87 | HR 89 | Wt 252.0 lb

## 2013-05-28 DIAGNOSIS — M79609 Pain in unspecified limb: Secondary | ICD-10-CM

## 2013-05-28 DIAGNOSIS — M25571 Pain in right ankle and joints of right foot: Secondary | ICD-10-CM

## 2013-05-28 DIAGNOSIS — M79671 Pain in right foot: Secondary | ICD-10-CM

## 2013-05-28 DIAGNOSIS — M25579 Pain in unspecified ankle and joints of unspecified foot: Secondary | ICD-10-CM

## 2013-05-28 DIAGNOSIS — M7989 Other specified soft tissue disorders: Secondary | ICD-10-CM

## 2013-05-28 NOTE — Progress Notes (Signed)
Subjective:    Patient ID: Stephen Orozco, male    DOB: 09/21/1971, 42 y.o.   MRN: [id number]  HPI About 2-3 weeks ago right ankle started to get sore.  But no swelling at that time. Then traveled and it got worse.  Then started swelling.  Most pain is over the lateral foot, instep, heel, and occ the toes. He has hardward in his left ankle from an ankle fusion. No trauma or twisting it.  Then 2 days ago it has felt better.  Dad with hx of gout. Swelling has gone down now.  More painful to walk on it. Feels better at rest. No prior history of gout or other type of arthritis.   Review of Systems  BP 125/87  Pulse 89  Wt 252 lb (114.306 kg)  BMI 34.17 kg/m2    No Known Allergies  Past Medical History  Diagnosis Date  . Hypertension   . Hyperlipidemia     Past Surgical History  Procedure Laterality Date  . Appendectomy    . Medial malleolar fixation      History   Social History  . Marital Status: Married    Spouse Name: N/A    Number of Children: 2  . Years of Education: N/A   Occupational History  . unemployed.     Social History Main Topics  . Smoking status: Current Every Day Smoker -- 0.30 packs/day for 10 years    Types: Cigarettes  . Smokeless tobacco: Not on file  . Alcohol Use: 1 - 1.5 oz/week    2-3 drink(s) per week  . Drug Use: Not on file  . Sexual Activity: Not on file   Other Topics Concern  . Not on file   Social History Narrative   1 half caff drink daily. No regular exercise. Wife is a Engineer, civil (consulting). He is unemployed right now.     Family History  Problem Relation Age of Onset  . Alcohol abuse Father   . Cancer    . Heart attack    . Depression    . Diabetes Father   . Hyperlipidemia Father   . Hypertension Father     Outpatient Encounter Prescriptions as of 05/28/2013  Medication Sig Dispense Refill  . buPROPion (WELLBUTRIN XL) 300 MG 24 hr tablet TAKE 1 TABLET (300 MG TOTAL) BY MOUTH EVERY MORNING. BRAND ONLY.  30 tablet  2  . busPIRone  (BUSPAR) 15 MG tablet Take 15 mg by mouth as needed.      Marland Kitchen lisinopril (PRINIVIL,ZESTRIL) 10 MG tablet TAKE 1 TABLET (10 MG TOTAL) BY MOUTH DAILY.  90 tablet  1  . pravastatin (PRAVACHOL) 40 MG tablet Take 1 tablet (40 mg total) by mouth daily.  30 tablet  5   No facility-administered encounter medications on file as of 05/28/2013.          Objective:   Physical Exam  Constitutional: He appears well-developed and well-nourished.  Musculoskeletal:  Right ankle with decreased flexion and extension. He is nontender over the lateral or feel malleolus. He is actually tender over the distal fourth metatarsal. Toes with normal flexion and extension. Dorsal pedal pulse 2+. He does have what looks like some petechiae over the middle portion of the foot just anterior to the medial malleolus. Scars look well healed.          Assessment & Plan:  Right foot pain-certainly could have been a mild strain or injury. I would like to get an x-ray to rule out  a possible fracture though there was no trauma. The description of his significant swelling I think a fracture is a possibility and he has pont tenderness along the distal 4th MT. There is also a family history of gout so I do think it's reasonable to check a uric acid level and a CBC.Will call with resutls, Rest, ice, elevated adn can use NSAID prn.  It seems to have gotten much better in the last couple of days.

## 2013-05-29 LAB — URIC ACID: Uric Acid, Serum: 6.6 mg/dL (ref 4.0–7.8)

## 2013-05-29 LAB — CBC
HCT: 43.7 % (ref 39.0–52.0)
Hemoglobin: 15.4 g/dL (ref 13.0–17.0)
MCHC: 35.2 g/dL (ref 30.0–36.0)
RBC: 4.93 MIL/uL (ref 4.22–5.81)
WBC: 7.6 10*3/uL (ref 4.0–10.5)

## 2013-06-01 ENCOUNTER — Other Ambulatory Visit: Payer: Self-pay | Admitting: Family Medicine

## 2013-07-06 ENCOUNTER — Other Ambulatory Visit: Payer: Self-pay | Admitting: Family Medicine

## 2013-10-05 ENCOUNTER — Other Ambulatory Visit: Payer: Self-pay | Admitting: *Deleted

## 2013-10-05 MED ORDER — BUPROPION HCL ER (XL) 300 MG PO TB24: ORAL_TABLET | ORAL | Status: DC

## 2013-10-05 NOTE — Telephone Encounter (Signed)
Pt calls and LM on Tonya's VM needing a refill on the Bupropion XL- said pharmacy would not fill med.  Called pt back and LMOM that I had refilled the med for 1 month supply and that he needed to call office back to schedule OV with MD for followup on this med. Barry Dienes, LPN

## 2013-11-20 ENCOUNTER — Ambulatory Visit (INDEPENDENT_AMBULATORY_CARE_PROVIDER_SITE_OTHER): Payer: BC Managed Care – PPO | Admitting: Physician Assistant

## 2013-11-20 ENCOUNTER — Encounter: Payer: Self-pay | Admitting: Physician Assistant

## 2013-11-20 VITALS — BP 128/83 | HR 96 | Wt 273.0 lb

## 2013-11-20 DIAGNOSIS — E785 Hyperlipidemia, unspecified: Secondary | ICD-10-CM

## 2013-11-20 DIAGNOSIS — F419 Anxiety disorder, unspecified: Secondary | ICD-10-CM

## 2013-11-20 DIAGNOSIS — F17200 Nicotine dependence, unspecified, uncomplicated: Secondary | ICD-10-CM

## 2013-11-20 DIAGNOSIS — Z79899 Other long term (current) drug therapy: Secondary | ICD-10-CM

## 2013-11-20 DIAGNOSIS — E669 Obesity, unspecified: Secondary | ICD-10-CM

## 2013-11-20 DIAGNOSIS — I10 Essential (primary) hypertension: Secondary | ICD-10-CM

## 2013-11-20 DIAGNOSIS — F411 Generalized anxiety disorder: Secondary | ICD-10-CM

## 2013-11-20 DIAGNOSIS — IMO0001 Reserved for inherently not codable concepts without codable children: Secondary | ICD-10-CM

## 2013-11-20 DIAGNOSIS — Z720 Tobacco use: Secondary | ICD-10-CM | POA: Insufficient documentation

## 2013-11-20 MED ORDER — BUPROPION HCL ER (XL) 300 MG PO TB24: ORAL_TABLET | ORAL | Status: DC

## 2013-11-20 MED ORDER — LISINOPRIL 10 MG PO TABS: ORAL_TABLET | ORAL | Status: DC

## 2013-11-20 NOTE — Progress Notes (Signed)
   Subjective:    Patient ID: Stephen Orozco, male    DOB: 24-May-1971, 43 y.o.   MRN: [id number]  HPI Patient is a 43 year old male who presents to the clinic to followup on medications and get refills.  Hypertension-patient denies any chest pains, palpitations, vision changes, headache. Patient has not had any problem taking medication. Patient is very compliant with medication. Patient denies any regular exercise.  Hyperlipidemia-we'll not check cholesterol since starting pravastatin. He takes pravastatin daily without any difficulty. He denies making any diet changes with low fat or exercise.  Anxiety-patient does feel rather controlled on Wellbutrin 300 mg daily. Patient occasionally takes BuSpar at bedtime but not. Often. He feels like his pressure anger are much better controlled.   Review of Systems     Objective:   Physical Exam  Constitutional: He is oriented to person, place, and time. He appears well-developed and well-nourished.  HENT:  Head: Normocephalic and atraumatic.  Cardiovascular: Normal rate, regular rhythm and normal heart sounds.   Pulmonary/Chest: Effort normal and breath sounds normal.  Neurological: He is alert and oriented to person, place, and time.  Skin: Skin is warm and dry.  Psychiatric: He has a normal mood and affect. His behavior is normal.          Assessment & Plan:  Hypertension-refilled lisinopril for 6 months. He is controlled today. Discussed the importance of weight loss and regular exercise and decreasing blood pressure. Encouraged low salt diet.  Hyperlipidemia-did not refill his statin today and he'll recheck lipid level. He has not had a lipid levels since starting medication. Denies any side effects.  Anxiety-refilled Wellbutrin 300 mg today in office. Followup in one year.   Obesity-discussed with patient importance of overall help make it with weight control. Patient reports he is not able to exercise 2 to working 50+ hours a  week and having 3 children. Discussed making better diet choices and patient is aware and will try to implement. Patient declines any nutritionist appointment at this time.  Tobacco abuse-quitting smoking was encouraged today. Patient reports not ready.

## 2013-11-24 ENCOUNTER — Ambulatory Visit (INDEPENDENT_AMBULATORY_CARE_PROVIDER_SITE_OTHER): Payer: BC Managed Care – PPO | Admitting: Family Medicine

## 2013-11-24 ENCOUNTER — Encounter: Payer: Self-pay | Admitting: Family Medicine

## 2013-11-24 VITALS — BP 131/94 | HR 110 | Temp 100.7°F | Wt 273.0 lb

## 2013-11-24 DIAGNOSIS — J101 Influenza due to other identified influenza virus with other respiratory manifestations: Secondary | ICD-10-CM

## 2013-11-24 DIAGNOSIS — J111 Influenza due to unidentified influenza virus with other respiratory manifestations: Secondary | ICD-10-CM

## 2013-11-24 LAB — POCT INFLUENZA A/B
Influenza A, POC: POSITIVE
Influenza B, POC: NEGATIVE

## 2013-11-24 MED ORDER — OSELTAMIVIR PHOSPHATE 75 MG PO CAPS: 75.0000 mg | ORAL_CAPSULE | Freq: Two times a day (BID) | ORAL | Status: DC

## 2013-11-24 NOTE — Progress Notes (Signed)
CC: Stephen Orozco is a 44 y.o. male is here for Fever   Subjective: HPI:  Complains of a fever that began at 2 AM this morning abruptly and has been hovering around 102.3 however did improve with taking Tylenol only for 4 hours. Symptoms were accompanied and have been accompanied by diffuse myalgias, nonproductive cough, facial pressure and hard to localize headache. Overall symptoms are moderate in severity. Said it's been persistent since onset.  He denies confusion, photophobia, neck pain, back pain, chest pain, shortness of breath, nor any GI disturbance   Review Of Systems Outlined In HPI  Past Medical History  Diagnosis Date  . Hypertension   . Hyperlipidemia     Past Surgical History  Procedure Laterality Date  . Appendectomy    . Medial malleolar fixation Right     Ankle   Family History  Problem Relation Age of Onset  . Alcohol abuse Father   . Cancer    . Heart attack    . Depression    . Diabetes Father   . Hyperlipidemia Father   . Hypertension Father   . Gout Father     History   Social History  . Marital Status: Married    Spouse Name: N/A    Number of Children: 2  . Years of Education: N/A   Occupational History  . unemployed.     Social History Main Topics  . Smoking status: Current Every Day Smoker -- 0.30 packs/day for 10 years    Types: Cigarettes  . Smokeless tobacco: Not on file  . Alcohol Use: 1 - 1.5 oz/week    2-3 drink(s) per week  . Drug Use: Not on file  . Sexual Activity: Not on file   Other Topics Concern  . Not on file   Social History Narrative   1 half caff drink daily. No regular exercise. Wife is a Marine scientist. He is unemployed right now.      Objective: BP 131/94  Pulse 110  Temp(Src) 100.7 F (38.2 C) (Oral)  Wt 273 lb (123.832 kg)  General: Alert and Oriented, No Acute Distress, appears mildly fatigued however cracking jokes HEENT: Pupils equal, round, reactive to light. Mild peripheral conjunctival injection  bilaterally.  External ears unremarkable, canals clear with intact TMs with appropriate landmarks.  Middle ear appears open without effusion. Pink inferior turbinates.  Moist mucous membranes, pharynx without inflammation nor lesions.  Neck supple without palpable lymphadenopathy nor abnormal masses. Lungs: Clear to auscultation bilaterally, no wheezing/ronchi/rales.  Comfortable work of breathing. Good air movement. Cardiac: Regular rate and rhythm. Normal S1/S2.  No murmurs, rubs, nor gallops.   Extremities: No peripheral edema.  Strong peripheral pulses.  Mental Status: No depression, anxiety, nor agitation. Skin: Warm and dry.  Assessment & Plan: Stephen Orozco was seen today for fever.  Diagnoses and associated orders for this visit:  Influenza A - POCT Influenza A/B  Other Orders - oseltamivir (TAMIFLU) 75 MG capsule; Take 1 capsule (75 mg total) by mouth 2 (two) times daily.    Rapid flu is positive for a, start Tamiflu, encouraged use Alka-Seltzer cold and sinus as needed for symptoms focus more on fluids rather than foods. Wife is pregnant and I strongly encouraged him to contact their OB/GYN immediately to discuss prophylactic therapy   Return if symptoms worsen or fail to improve.

## 2013-11-27 LAB — COMPLETE METABOLIC PANEL WITH GFR
ALBUMIN: 4.2 g/dL (ref 3.5–5.2)
ALT: 37 U/L (ref 0–53)
AST: 20 U/L (ref 0–37)
Alkaline Phosphatase: 47 U/L (ref 39–117)
BUN: 12 mg/dL (ref 6–23)
CALCIUM: 9.4 mg/dL (ref 8.4–10.5)
CHLORIDE: 102 meq/L (ref 96–112)
CO2: 28 meq/L (ref 19–32)
Creat: 0.91 mg/dL (ref 0.50–1.35)
GFR, Est Non African American: >89 mL/min
Glucose, Bld: 93 mg/dL (ref 70–99)
POTASSIUM: 4.3 meq/L (ref 3.5–5.3)
Sodium: 141 mEq/L (ref 135–145)
Total Bilirubin: 0.4 mg/dL (ref 0.2–1.2)
Total Protein: 7.1 g/dL (ref 6.0–8.3)

## 2013-11-27 LAB — LIPID PANEL
CHOLESTEROL: 202 mg/dL — AB (ref 0–200)
HDL: 33 mg/dL — ABNORMAL LOW (ref >39)
LDL CALC: 130 mg/dL — AB (ref 0–99)
TRIGLYCERIDES: 193 mg/dL — AB (ref <150)
Total CHOL/HDL Ratio: 6.1 Ratio
VLDL: 39 mg/dL (ref 0–40)

## 2013-11-30 ENCOUNTER — Other Ambulatory Visit: Payer: Self-pay | Admitting: *Deleted

## 2013-11-30 MED ORDER — PRAVASTATIN SODIUM 40 MG PO TABS: ORAL_TABLET | ORAL | Status: DC

## 2014-01-10 ENCOUNTER — Other Ambulatory Visit: Payer: Self-pay | Admitting: Family Medicine

## 2014-03-03 ENCOUNTER — Other Ambulatory Visit: Payer: Self-pay | Admitting: Family Medicine

## 2014-04-01 ENCOUNTER — Telehealth: Payer: Self-pay | Admitting: *Deleted

## 2014-04-01 DIAGNOSIS — Z3009 Encounter for other general counseling and advice on contraception: Secondary | ICD-10-CM

## 2014-04-01 NOTE — Telephone Encounter (Signed)
I really like Dr. Risa Grill at [facility]

## 2014-04-01 NOTE — Telephone Encounter (Signed)
Wife called stating pt wants a referral to a urologist for a vasectomy. Please advise if we can send a referral. Oscar La, LPN

## 2014-04-01 NOTE — Telephone Encounter (Signed)
[facility] for referral. See if he has a preference.

## 2014-04-01 NOTE — Telephone Encounter (Signed)
They don't have a preference. States any one you think is good.  Oscar La, LPN

## 2014-04-02 NOTE — Telephone Encounter (Signed)
Wife informed.  Oscar La, LPN

## 2014-04-25 ENCOUNTER — Other Ambulatory Visit: Payer: Self-pay | Admitting: Family Medicine

## 2014-05-21 ENCOUNTER — Ambulatory Visit: Payer: BC Managed Care – PPO | Admitting: Family Medicine

## 2014-05-25 ENCOUNTER — Other Ambulatory Visit: Payer: Self-pay | Admitting: Family Medicine

## 2014-06-04 ENCOUNTER — Encounter: Payer: Self-pay | Admitting: Family Medicine

## 2014-06-04 ENCOUNTER — Ambulatory Visit (INDEPENDENT_AMBULATORY_CARE_PROVIDER_SITE_OTHER): Payer: BC Managed Care – PPO | Admitting: Family Medicine

## 2014-06-04 VITALS — BP 116/79 | HR 85 | Ht 72.0 in | Wt 263.0 lb

## 2014-06-04 DIAGNOSIS — I10 Essential (primary) hypertension: Secondary | ICD-10-CM

## 2014-06-04 DIAGNOSIS — Z23 Encounter for immunization: Secondary | ICD-10-CM | POA: Diagnosis not present

## 2014-06-04 DIAGNOSIS — F419 Anxiety disorder, unspecified: Secondary | ICD-10-CM

## 2014-06-04 DIAGNOSIS — F411 Generalized anxiety disorder: Secondary | ICD-10-CM

## 2014-06-04 LAB — BASIC METABOLIC PANEL WITH GFR
BUN: 14 mg/dL (ref 6–23)
CALCIUM: 10.1 mg/dL (ref 8.4–10.5)
CHLORIDE: 102 meq/L (ref 96–112)
CO2: 29 meq/L (ref 19–32)
CREATININE: 0.94 mg/dL (ref 0.50–1.35)
GFR, Est African American: >89 mL/min
GFR, Est Non African American: >89 mL/min
Glucose, Bld: 106 mg/dL — ABNORMAL HIGH (ref 70–99)
Potassium: 4.7 mEq/L (ref 3.5–5.3)
Sodium: 139 mEq/L (ref 135–145)

## 2014-06-04 MED ORDER — BUPROPION HCL ER (XL) 300 MG PO TB24: ORAL_TABLET | ORAL | Status: DC

## 2014-06-04 NOTE — Assessment & Plan Note (Signed)
Well-controlled on current regimen. Happy with it. They have a new baby and things have been going well. Followup in 6 months.

## 2014-06-04 NOTE — Assessment & Plan Note (Signed)
Well-controlled on current regimen.  Follow-up in 6 months. 

## 2014-06-04 NOTE — Progress Notes (Signed)
   Subjective:    Patient ID: Stephen Orozco, male    DOB: 07-31-1971, 43 y.o.   MRN: [id number]  Hypertension   Anxiety - overall he is doing very well with his regimen. He would like to get the generic Wellbutrin because the brand only was quite costly. He feels like he is less irritable.  Hypertension- Pt denies chest pain, SOB, dizziness, or heart palpitations.  Taking meds as directed w/o problems.  Denies medication side effects.     Review of Systems     Objective:   Physical Exam  Constitutional: He is oriented to person, place, and time. He appears well-developed and well-nourished.  HENT:  Head: Normocephalic and atraumatic.  Cardiovascular: Normal rate, regular rhythm and normal heart sounds.   Pulmonary/Chest: Effort normal and breath sounds normal.  Neurological: He is alert and oriented to person, place, and time.  Skin: Skin is warm and dry.  Psychiatric: He has a normal mood and affect. His behavior is normal.          Assessment & Plan:  Vaccine given today.

## 2014-06-06 NOTE — Progress Notes (Signed)
Quick Note:  All labs are normal. ______ 

## 2014-12-03 ENCOUNTER — Encounter: Payer: Self-pay | Admitting: Family Medicine

## 2014-12-03 ENCOUNTER — Ambulatory Visit (INDEPENDENT_AMBULATORY_CARE_PROVIDER_SITE_OTHER): Payer: BC Managed Care – PPO | Admitting: Family Medicine

## 2014-12-03 VITALS — BP 121/85 | HR 86 | Ht 72.0 in | Wt 253.0 lb

## 2014-12-03 DIAGNOSIS — L723 Sebaceous cyst: Secondary | ICD-10-CM

## 2014-12-03 DIAGNOSIS — L821 Other seborrheic keratosis: Secondary | ICD-10-CM | POA: Diagnosis not present

## 2014-12-03 DIAGNOSIS — D239 Other benign neoplasm of skin, unspecified: Secondary | ICD-10-CM

## 2014-12-03 NOTE — Progress Notes (Signed)
   Subjective:    Patient ID: Stephen Orozco, male    DOB: 31-Mar-1971, 44 y.o.   MRN: [id number]  HPI Has 3 moles he would like me check out.  Has one on his scalp that has been there for 2 months.  One on the right nipple for about 2 weeks. One on the right lower leg for a couple of months. He has a family history skin cancer and just wanted to come in and make sure that these were benign lesions. Note, he does have a prior history of having a sebaceous cyst on the scalp which he had removed years ago. Review of Systems     Objective:   Physical Exam  Constitutional: He appears well-developed and well-nourished.  HENT:  Head: Normocephalic and atraumatic.  Skin: Skin is warm and dry.  He has an approximately 1/2-27 m sebaceous cyst on the scalp. It's not inflamed irritated or red. On the right nipple along the edge of the area Lahey has a seborrheic keratosis. And on the right lower extremity near the shin he has a dermatofibroma that is brown in color.  Psychiatric: He has a normal mood and affect. His behavior is normal.          Assessment & Plan:  Sebaceous cyst on scalp-discussed the diagnosis and potential treatment. Certainly this can be removed if they become bothersome they are technically benign lesions. He says if it becomes larger her come in and have it removed.  Seborrheic keratosis on chest - explained the benign nature of this lesion. Can be frozen if desired.  Dermatofibroma on leg- discussed that this is a type of scar tissue and is a benign lesion and gave reassurance.

## 2014-12-03 NOTE — Patient Instructions (Signed)
Sebaceous cyst on scalpt  Seborrheic keratosis on chest   Dermatofibroma on leg

## 2014-12-10 ENCOUNTER — Telehealth: Payer: Self-pay | Admitting: Family Medicine

## 2014-12-10 ENCOUNTER — Encounter: Payer: Self-pay | Admitting: Family Medicine

## 2014-12-10 ENCOUNTER — Ambulatory Visit (INDEPENDENT_AMBULATORY_CARE_PROVIDER_SITE_OTHER): Payer: BC Managed Care – PPO | Admitting: Family Medicine

## 2014-12-10 VITALS — BP 114/79 | HR 82 | Ht 72.0 in | Wt 256.0 lb

## 2014-12-10 DIAGNOSIS — R0683 Snoring: Secondary | ICD-10-CM | POA: Diagnosis not present

## 2014-12-10 DIAGNOSIS — I10 Essential (primary) hypertension: Secondary | ICD-10-CM | POA: Diagnosis not present

## 2014-12-10 DIAGNOSIS — E785 Hyperlipidemia, unspecified: Secondary | ICD-10-CM

## 2014-12-10 DIAGNOSIS — F419 Anxiety disorder, unspecified: Secondary | ICD-10-CM

## 2014-12-10 DIAGNOSIS — Z114 Encounter for screening for human immunodeficiency virus [HIV]: Secondary | ICD-10-CM | POA: Diagnosis not present

## 2014-12-10 LAB — COMPLETE METABOLIC PANEL WITH GFR
ALK PHOS: 58 U/L (ref 39–117)
ALT: 25 U/L (ref 0–53)
AST: 18 U/L (ref 0–37)
Albumin: 4.6 g/dL (ref 3.5–5.2)
BUN: 11 mg/dL (ref 6–23)
CHLORIDE: 105 meq/L (ref 96–112)
CO2: 26 mEq/L (ref 19–32)
Calcium: 9.3 mg/dL (ref 8.4–10.5)
Creat: 1 mg/dL (ref 0.50–1.35)
GFR, Est African American: >89 mL/min
GFR, Est Non African American: >89 mL/min
Glucose, Bld: 94 mg/dL (ref 70–99)
POTASSIUM: 4.5 meq/L (ref 3.5–5.3)
Sodium: 140 mEq/L (ref 135–145)
TOTAL PROTEIN: 7.2 g/dL (ref 6.0–8.3)
Total Bilirubin: 0.4 mg/dL (ref 0.2–1.2)

## 2014-12-10 LAB — LIPID PANEL
Cholesterol: 191 mg/dL (ref 0–200)
HDL: 38 mg/dL — AB (ref >=40)
LDL Cholesterol: 126 mg/dL — ABNORMAL HIGH (ref 0–99)
TRIGLYCERIDES: 136 mg/dL (ref <150)
Total CHOL/HDL Ratio: 5 Ratio
VLDL: 27 mg/dL (ref 0–40)

## 2014-12-10 MED ORDER — PRAVASTATIN SODIUM 40 MG PO TABS: ORAL_TABLET | ORAL | Status: DC

## 2014-12-10 MED ORDER — LISINOPRIL 10 MG PO TABS: ORAL_TABLET | ORAL | Status: DC

## 2014-12-10 NOTE — Telephone Encounter (Signed)
Please call patient and let him know that he did screen positive for sleep apnea. I would like to set him up for a home sleep study. We will send in an order and he should be contacted to pick up the device at our office. They will show him how to put it on overnight and when he is done he will drop it back off at our office and the results will be read.

## 2014-12-10 NOTE — Progress Notes (Signed)
   Subjective:    Patient ID: Stephen Orozco, male    DOB: November 01, 1970, 44 y.o.   MRN: [id number]  HPI Hypertension- Pt denies chest pain, SOB, dizziness, or heart palpitations.  Taking meds as directed w/o problems.  Denies medication side effects.    Would like to be evaluated for sleep apnea.  Does snore and wife has witnessed apnea.  He recently had a physical for the government and they have suggested that he might want to be evaluated for sleep apnea.   Anxiety - well controlled on wellbutrin. Happy with regimen. Does complain about irritability.  Otherwise no side effects with the medication. Sleeping well overall.   Review of Systems     Objective:   Physical Exam  Constitutional: He is oriented to person, place, and time. He appears well-developed and well-nourished.  HENT:  Head: Normocephalic and atraumatic.  Cardiovascular: Normal rate, regular rhythm and normal heart sounds.   Pulmonary/Chest: Effort normal and breath sounds normal.  Neurological: He is alert and oriented to person, place, and time.  Skin: Skin is warm and dry.  Psychiatric: He has a normal mood and affect. His behavior is normal.          Assessment & Plan:  HTN - well controlled. Due for CMP and lipids . F/U in 6 mo.   Anxiety - well controlled. GAD- 7 score of 5 today. He is happy with regimen and doesn't want to change it today.   Snoring - Will perform STOP BANG.  Screening questionnaire was positive for 6 out of 8 questions. This puts him at high risk for sleep apnea. Will consider home study testing since he has no prior history of pulmonary or cardiac disease.

## 2014-12-11 LAB — HIV ANTIBODY (ROUTINE TESTING W REFLEX): HIV 1&2 Ab, 4th Generation: NONREACTIVE

## 2014-12-11 NOTE — Telephone Encounter (Signed)
Called pt and lvm informing him of results and recommendations. Advised to rtn call w/any questions.Stephen Orozco [place]

## 2014-12-28 ENCOUNTER — Ambulatory Visit (INDEPENDENT_AMBULATORY_CARE_PROVIDER_SITE_OTHER): Payer: BC Managed Care – PPO | Admitting: Family Medicine

## 2014-12-28 ENCOUNTER — Encounter: Payer: Self-pay | Admitting: Family Medicine

## 2014-12-28 VITALS — BP 127/90 | HR 74 | Wt 256.0 lb

## 2014-12-28 DIAGNOSIS — F411 Generalized anxiety disorder: Secondary | ICD-10-CM | POA: Diagnosis not present

## 2014-12-28 NOTE — Progress Notes (Signed)
   Subjective:    Patient ID: Stephen Orozco, male    DOB: 02-Nov-1970, 44 y.o.   MRN: [id number]  HPI Patient came in today because he is applied for a job for [employer]. Because of his history of using Wellbutrin for anxiety and depression he needs a form completed before they will continue to process his application. He is applying for position as a Scientist, product/process development. He was initially started on Zoloft in July 2013. We then switched him to Wellbutrin in November 2013. He has been on the medication since then. We have followed him carefully for depression and anxiety especially after the death of his father was also a patient here in our office. He has been happy with his regimen and is well adjusted. His anxiety and depressive symptoms have been well controlled.   Review of Systems     Objective:   Physical Exam  Constitutional: He is oriented to person, place, and time. He appears well-developed and well-nourished.  HENT:  Head: Normocephalic and atraumatic.  Neurological: He is alert and oriented to person, place, and time.  Skin: Skin is warm and dry.  Psychiatric: He has a normal mood and affect. His behavior is normal.          Assessment & Plan:  GAD/Depression- Please see copy of completed form which included questions about his general well-being, how he performs at work, etc. We went through each of these questions in detail in the form was completed.

## 2014-12-30 ENCOUNTER — Telehealth: Payer: Self-pay | Admitting: *Deleted

## 2014-12-30 NOTE — Telephone Encounter (Signed)
Called and informed pt that forms have been completed and faxed original placed up front for p/u.Audelia Hives [place]

## 2014-12-30 NOTE — Telephone Encounter (Signed)
Pt's medical forms faxed, copy made and placed in scan folder, confirmation received.Stephen Orozco

## 2014-12-31 ENCOUNTER — Other Ambulatory Visit: Payer: Self-pay | Admitting: Family Medicine

## 2015-02-04 ENCOUNTER — Encounter: Payer: Self-pay | Admitting: Family Medicine

## 2015-02-04 ENCOUNTER — Ambulatory Visit (INDEPENDENT_AMBULATORY_CARE_PROVIDER_SITE_OTHER): Payer: BC Managed Care – PPO | Admitting: Family Medicine

## 2015-02-04 VITALS — BP 140/90 | HR 81 | Ht 72.0 in | Wt 262.0 lb

## 2015-02-04 DIAGNOSIS — R0683 Snoring: Secondary | ICD-10-CM | POA: Diagnosis not present

## 2015-02-04 NOTE — Progress Notes (Signed)
   Subjective:    Patient ID: Stephen Orozco, male    DOB: [birth year], 44 y.o.   MRN: [id number]  HPI patient came in today to have form completed for job application. They have noted some concerns that he might have sleep apnea. We place an order through the sleep study center to get him tested with a home study. They said they had not heard back from the insurance company. We called [facility] today and they confirmed that he has to have an in lab study. New order placed. We will try to get him scheduled at his convenience. Paperwork was completed. He does not have excessive daytime sleepiness. His BMI is under 35.  Review of Systems     Objective:   Physical Exam  Constitutional: He appears well-developed and well-nourished.  Psychiatric: He has a normal mood and affect. His behavior is normal.          Assessment & Plan:  Snoring-will reorder for sleep study to be done in the lab. Completed paperwork today. Follow-up as needed.

## 2015-06-10 ENCOUNTER — Ambulatory Visit (INDEPENDENT_AMBULATORY_CARE_PROVIDER_SITE_OTHER): Payer: BC Managed Care – PPO | Admitting: Family Medicine

## 2015-06-10 ENCOUNTER — Encounter: Payer: Self-pay | Admitting: Family Medicine

## 2015-06-10 VITALS — BP 118/84 | HR 80 | Ht 72.0 in | Wt 243.0 lb

## 2015-06-10 DIAGNOSIS — Z23 Encounter for immunization: Secondary | ICD-10-CM | POA: Diagnosis not present

## 2015-06-10 DIAGNOSIS — E669 Obesity, unspecified: Secondary | ICD-10-CM

## 2015-06-10 DIAGNOSIS — F411 Generalized anxiety disorder: Secondary | ICD-10-CM

## 2015-06-10 DIAGNOSIS — I10 Essential (primary) hypertension: Secondary | ICD-10-CM

## 2015-06-10 LAB — BASIC METABOLIC PANEL
BUN: 14 mg/dL (ref 7–25)
CALCIUM: 9.6 mg/dL (ref 8.6–10.3)
CO2: 26 mmol/L (ref 20–31)
Chloride: 101 mmol/L (ref 98–110)
Creat: 1.01 mg/dL (ref 0.60–1.35)
Glucose, Bld: 95 mg/dL (ref 65–99)
POTASSIUM: 4.6 mmol/L (ref 3.5–5.3)
SODIUM: 138 mmol/L (ref 135–146)

## 2015-06-10 MED ORDER — BUPROPION HCL ER (XL) 300 MG PO TB24: ORAL_TABLET | ORAL | Status: DC

## 2015-06-10 MED ORDER — PRAVASTATIN SODIUM 40 MG PO TABS: ORAL_TABLET | ORAL | Status: DC

## 2015-06-10 MED ORDER — LISINOPRIL 10 MG PO TABS: ORAL_TABLET | ORAL | Status: DC

## 2015-06-10 NOTE — Progress Notes (Signed)
   Subjective:    Patient ID: Stephen Orozco, male    DOB: Feb 10, 1971, 44 y.o.   MRN: [id number]  HPI  Hypertension- Pt denies chest pain, SOB, dizziness, or heart palpitations.  Taking meds as directed w/o problems.  Denies medication side effects.    GAD- doing well on Wellbutrin without any side effects or problems. Happy with current regimen. Feels like it's being effective and safe. He does complain of feeling nervous and on edge more than half the days and difficulty relaxing and controlling his worry. He does admit to some irritability several days a week. Symptoms are consistent with mild anxiety. He has bought a new house and doing a lot of work on it.   He has been going to the gym 3 days a week and really trying to work on diet and exercise. Per our scale he is down 19 pounds since April which is absolutely fantastic. He is brought his BMI down from 35-32. I would love to see him get his BMI under 30. He has been using at the bit to assist him. He does get some pain in his arms and feet at times.  Review of Systems     Objective:   Physical Exam  Constitutional: He is oriented to person, place, and time. He appears well-developed and well-nourished.  HENT:  Head: Normocephalic and atraumatic.  Cardiovascular: Normal rate, regular rhythm and normal heart sounds.   Pulmonary/Chest: Effort normal and breath sounds normal.  Neurological: He is alert and oriented to person, place, and time.  Skin: Skin is warm and dry.  Psychiatric: He has a normal mood and affect. His behavior is normal.          Assessment & Plan:  HTN - well controlled. Continue current regimen. Due for BMP today. Follow-up in 6 months.  Anxiety-gad 7 score of 6 today. He rates his symptoms as somewhat difficult. He has several things going on in his life. The kids are going back to school next week and he and his wife are buying a new house. And he himself is in school.  Obesity/BMI 32-he has made some  great strides over the last 4 months. Continue with diet and exercise.  Flu vaccine given today.

## 2015-06-11 NOTE — Progress Notes (Signed)
Quick Note:  All labs are normal. ______ 

## 2015-06-26 ENCOUNTER — Other Ambulatory Visit: Payer: Self-pay | Admitting: Family Medicine

## 2015-07-01 ENCOUNTER — Other Ambulatory Visit: Payer: Self-pay | Admitting: Family Medicine

## 2015-12-09 ENCOUNTER — Ambulatory Visit: Payer: BC Managed Care – PPO | Admitting: Family Medicine

## 2015-12-16 ENCOUNTER — Encounter: Payer: Self-pay | Admitting: Family Medicine

## 2015-12-16 ENCOUNTER — Ambulatory Visit (INDEPENDENT_AMBULATORY_CARE_PROVIDER_SITE_OTHER): Payer: BC Managed Care – PPO | Admitting: Family Medicine

## 2015-12-16 VITALS — BP 112/67 | HR 72 | Ht 72.0 in | Wt 261.0 lb

## 2015-12-16 DIAGNOSIS — E785 Hyperlipidemia, unspecified: Secondary | ICD-10-CM | POA: Diagnosis not present

## 2015-12-16 DIAGNOSIS — F411 Generalized anxiety disorder: Secondary | ICD-10-CM | POA: Diagnosis not present

## 2015-12-16 DIAGNOSIS — I10 Essential (primary) hypertension: Secondary | ICD-10-CM

## 2015-12-16 LAB — COMPLETE METABOLIC PANEL WITH GFR
ALT: 28 U/L (ref 9–46)
AST: 22 U/L (ref 10–40)
Albumin: 4.8 g/dL (ref 3.6–5.1)
Alkaline Phosphatase: 52 U/L (ref 40–115)
BILIRUBIN TOTAL: 0.7 mg/dL (ref 0.2–1.2)
BUN: 15 mg/dL (ref 7–25)
CHLORIDE: 99 mmol/L (ref 98–110)
CO2: 29 mmol/L (ref 20–31)
Calcium: 10 mg/dL (ref 8.6–10.3)
Creat: 1.09 mg/dL (ref 0.60–1.35)
GFR, EST NON AFRICAN AMERICAN: 82 mL/min (ref >=60)
GFR, Est African American: >89 mL/min (ref >=60)
GLUCOSE: 97 mg/dL (ref 65–99)
Potassium: 4.5 mmol/L (ref 3.5–5.3)
SODIUM: 136 mmol/L (ref 135–146)
TOTAL PROTEIN: 7.2 g/dL (ref 6.1–8.1)

## 2015-12-16 LAB — LIPID PANEL
CHOL/HDL RATIO: 5.1 ratio — AB (ref <=5.0)
Cholesterol: 189 mg/dL (ref 125–200)
HDL: 37 mg/dL — AB (ref >=40)
LDL CALC: 126 mg/dL (ref <130)
TRIGLYCERIDES: 128 mg/dL (ref <150)
VLDL: 26 mg/dL (ref <30)

## 2015-12-16 MED ORDER — BUPROPION HCL ER (XL) 150 MG PO TB24: 150.0000 mg | ORAL_TABLET | Freq: Every day | ORAL | Status: DC

## 2015-12-16 MED ORDER — LISINOPRIL 10 MG PO TABS
ORAL_TABLET | ORAL | Status: DC
Start: 2015-12-16 — End: 2017-02-04

## 2015-12-16 MED ORDER — BUPROPION HCL ER (XL) 300 MG PO TB24: ORAL_TABLET | ORAL | Status: DC

## 2015-12-16 MED ORDER — PRAVASTATIN SODIUM 40 MG PO TABS: ORAL_TABLET | ORAL | Status: DC

## 2015-12-16 NOTE — Progress Notes (Signed)
   Subjective:    Patient ID: Stephen Orozco, male    DOB: 25-Dec-1970, 45 y.o.   MRN: [id number]  HPI Hypertension- Pt denies chest pain, SOB, dizziness, or heart palpitations.  Taking meds as directed w/o problems.  Denies medication side effects.    GAD- He is doing well overall with his mood. He is currently on Wellbutrin 300 mg etc. release once a day. He is still struggling with some irritability. He has been under a little bit more stress recently with moving into a new house that needs a lot of repairs. He still reports feeling nervous and on edge several days of the week and difficulty with worry.  Hyperlpidemia - doing well on statin. No myalgias or side effects.   Review of Systems     Objective:   Physical Exam  Constitutional: He is oriented to person, place, and time. He appears well-developed and well-nourished.  HENT:  Head: Normocephalic and atraumatic.  Cardiovascular: Normal rate, regular rhythm and normal heart sounds.   Pulmonary/Chest: Effort normal and breath sounds normal.  Neurological: He is alert and oriented to person, place, and time.  Skin: Skin is warm and dry.  Psychiatric: He has a normal mood and affect. His behavior is normal.          Assessment & Plan:  HTN  - Well controlled. Continue current regimen. Follow up in 6 mo  GAD - GAD- 7 score score of of 9, previous of 6. Rates his symptoms is somewhat difficult. Has been not been working out since moved into his new house in September.  We discussed the option of increasing Wellbutrin to 450 mg total. Will send of her Cipro prescription for the 150 which she can add to the 3 mg tab for a month and see if he feels like it's effective. If not then he can drop back down. If he does feel like it's helping then we can send a new prescription to the pharmacy.  Hyperlipidemia-tolerating statin well. Due for lipid check.

## 2016-04-14 ENCOUNTER — Emergency Department (INDEPENDENT_AMBULATORY_CARE_PROVIDER_SITE_OTHER): Payer: BC Managed Care – PPO

## 2016-04-14 ENCOUNTER — Encounter: Payer: Self-pay | Admitting: Emergency Medicine

## 2016-04-14 ENCOUNTER — Emergency Department
Admission: EM | Admit: 2016-04-14 | Discharge: 2016-04-14 | Disposition: A | Discharge status: Home / Self Care | Payer: BC Managed Care – PPO | Source: Home / Self Care | Attending: Family Medicine | Admitting: Family Medicine

## 2016-04-14 DIAGNOSIS — M79671 Pain in right foot: Secondary | ICD-10-CM | POA: Diagnosis not present

## 2016-04-14 DIAGNOSIS — S90121A Contusion of right lesser toe(s) without damage to nail, initial encounter: Secondary | ICD-10-CM

## 2016-04-14 DIAGNOSIS — M25571 Pain in right ankle and joints of right foot: Secondary | ICD-10-CM

## 2016-04-14 DIAGNOSIS — S99921A Unspecified injury of right foot, initial encounter: Secondary | ICD-10-CM | POA: Diagnosis not present

## 2016-04-14 MED ORDER — INDOMETHACIN 25 MG PO CAPS: 25.0000 mg | ORAL_CAPSULE | Freq: Three times a day (TID) | ORAL | Status: DC | PRN

## 2016-04-14 NOTE — ED Notes (Signed)
Incurred foot injury right foot 3 days ago. Took ibuprofen 600mg  po at 1530 today. Able to walk on foot, but uncomfortable.

## 2016-04-14 NOTE — ED Provider Notes (Signed)
CSN: [id number]     Arrival date & time 04/14/16  1717 History   First MD Initiated Contact with Patient 04/14/16 1742     Chief Complaint  Patient presents with  . Foot Pain   (Consider location/radiation/quality/duration/timing/severity/associated sxs/prior Treatment) HPI Stephen Orozco is a 45 y.o. male presenting to UC with c/o Right foot and toe pain for about 3 days after accidentally hitting his foot against a hard object.  Pt reports hx of multiple fractures that required extensive surgery in same foot several years ago. He reports mild intermittent swelling and soreness but nothing like current pain and discomfort.  Pain is minimal at rest but severe with light touch of his 2nd toe, which is bruised, and worse with ambulation.  He has been taking ibuprofen for pain, last dose 600mg  at 15:30 with minimal relief.  Denies hx of gout but notes his father has gout. His PCP has tested him in the past due to intermittent foot swelling but denies any definitive dx of gout.    Past Medical History  Diagnosis Date  . Hypertension   . Hyperlipidemia    Past Surgical History  Procedure Laterality Date  . Appendectomy    . Medial malleolar fixation Right     Ankle   Family History  Problem Relation Age of Onset  . Alcohol abuse Father   . Cancer    . Heart attack    . Depression    . Diabetes Father   . Hyperlipidemia Father   . Hypertension Father   . Gout Father    Social History  Substance Use Topics  . Smoking status: Former Smoker -- 0.30 packs/day for 10 years    Types: Cigarettes    Quit date: 09/14/2012  . Smokeless tobacco: None  . Alcohol Use: 1.0 - 1.5 oz/week    2-3 drink(s) per week    Review of Systems  Musculoskeletal: Positive for myalgias, joint swelling and arthralgias.  Skin: Positive for color change. Negative for wound.  Neurological: Negative for weakness and numbness.    Allergies  Review of patient's allergies indicates no known  allergies.  Home Medications   Prior to Admission medications   Medication Sig Start Date End Date Taking? Authorizing Provider  buPROPion (WELLBUTRIN XL) 150 MG 24 hr tablet Take 1 tablet (150 mg total) by mouth daily. 12/16/15   Hali Marry, MD  buPROPion (WELLBUTRIN XL) 300 MG 24 hr tablet TAKE 1 TABLET (300 MG TOTAL) BY MOUTH EVERY MORNING. 12/16/15   Hali Marry, MD  indomethacin (INDOCIN) 25 MG capsule Take 1 capsule (25 mg total) by mouth 3 (three) times daily as needed. 04/14/16   Noland Fordyce, PA-C  lisinopril (PRINIVIL,ZESTRIL) 10 MG tablet TAKE 1 TABLET (10 MG TOTAL) BY MOUTH DAILY. 12/16/15   Hali Marry, MD  pravastatin (PRAVACHOL) 40 MG tablet TAKE 1 TABLET (40 MG TOTAL) BY MOUTH DAILY. 12/16/15   Hali Marry, MD   Meds Ordered and Administered this Visit  Medications - No data to display  BP 122/79 mmHg  Pulse 89  Temp(Src) 98.4 F (36.9 C) (Oral)  Resp 16  Ht 6' (1.829 m)  Wt 250 lb (113.399 kg)  BMI 33.90 kg/m2  SpO2 97% No data found.   Physical Exam  Constitutional: He is oriented to person, place, and time. He appears well-developed and well-nourished.  HENT:  Head: Normocephalic and atraumatic.  Eyes: EOM are normal.  Neck: Normal range of motion.  Cardiovascular: Normal rate.  Pulses:      Dorsalis pedis pulses are 2+ on the right side.  Pulmonary/Chest: Effort normal.  Musculoskeletal: Normal range of motion. He exhibits edema and tenderness.  Mild to moderate edema to Right foot, mild edema to Left foot. Tenderness to 2nd toe and distal 2nd metatarsal of Right foot. Full ROM all toes and ankle. Right lower leg: calf is soft, non-tender.  Neurological: He is alert and oriented to person, place, and time.  Skin: Skin is warm and dry.  Right foot, 2nd toe- skin in tact, moderate ecchymosis. No erythema or warmth of toe or foot.   Psychiatric: He has a normal mood and affect. His behavior is normal.  Nursing note and vitals  reviewed.   ED Course  Procedures (including critical care time)  Labs Review Labs Reviewed - No data to display  Imaging Review Dg Foot Complete Right  04/14/2016  CLINICAL DATA:  Slipped getting into ache new. EXAM: RIGHT FOOT COMPLETE - 3+ VIEW COMPARISON:  05/28/2013 FINDINGS: No acute fracture or dislocation. Subtalar, calcaneocuboid and talonavicular arthrodesis with solid osseous bridging. 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 No soft tissue abnormality. IMPRESSION: 1. No acute osseous injury of the right foot. Electronically Signed   By: Kathreen Devoid   On: 04/14/2016 18:00      MDM   1. Superficial bruising of toe, right, initial encounter   2. Right foot injury, initial encounter   3. Right foot pain    Injury to Right foot, hx of prior Right foot surgeries. No personal hx of gout but father does have gout. Pt c/o severe pain with light touch on occasion of 2nd toe. Ecchymosis present but no erythema or warmth.  Plain films: negative for acute bony injury.  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  Arthritis also noted in foot.  Reassured pt no acute fractures or dislocations. Due to swelling, pain and question of hx of gout, will try trial of indomethacin. Advised to stop taking other NSAIDs. Recommended buddy tape, ace wrap, post-op shoe for protection. Rest, ice, and elevated. F/u with Sports Medicine in 1-2 weeks if not improving. Patient verbalized understanding and agreement with treatment plan.     Noland Fordyce, PA-C 04/15/16 1124

## 2016-04-14 NOTE — Discharge Instructions (Signed)
You are being prescribed indomethacin for swelling and pain in your foot. This is an antiinflamatory medication, please to not take other antiinflamatory medications such as ibuprofen or naprxen (Advil, Motrin, Aleve).

## 2016-06-22 ENCOUNTER — Ambulatory Visit: Payer: BC Managed Care – PPO | Admitting: Family Medicine

## 2016-06-29 ENCOUNTER — Ambulatory Visit: Payer: BC Managed Care – PPO | Admitting: Family Medicine

## 2016-07-06 ENCOUNTER — Encounter: Payer: Self-pay | Admitting: Family Medicine

## 2016-07-06 ENCOUNTER — Ambulatory Visit (INDEPENDENT_AMBULATORY_CARE_PROVIDER_SITE_OTHER): Payer: BC Managed Care – PPO | Admitting: Family Medicine

## 2016-07-06 VITALS — BP 117/81 | HR 80 | Wt 261.0 lb

## 2016-07-06 DIAGNOSIS — I10 Essential (primary) hypertension: Secondary | ICD-10-CM | POA: Diagnosis not present

## 2016-07-06 DIAGNOSIS — Z23 Encounter for immunization: Secondary | ICD-10-CM

## 2016-07-06 DIAGNOSIS — F411 Generalized anxiety disorder: Secondary | ICD-10-CM | POA: Diagnosis not present

## 2016-07-06 DIAGNOSIS — F419 Anxiety disorder, unspecified: Secondary | ICD-10-CM | POA: Diagnosis not present

## 2016-07-06 DIAGNOSIS — R0683 Snoring: Secondary | ICD-10-CM

## 2016-07-06 MED ORDER — BUPROPION HCL ER (XL) 150 MG PO TB24: ORAL_TABLET | ORAL | 1 refills | Status: DC

## 2016-07-06 MED ORDER — SERTRALINE HCL 50 MG PO TABS: ORAL_TABLET | ORAL | 3 refills | Status: DC

## 2016-07-06 NOTE — Progress Notes (Signed)
   Subjective:    Patient ID: Stephen Orozco, male    DOB: [birth year], 45 y.o.   MRN: [id number]  HPI Hypertension- Pt denies chest pain, SOB, dizziness, or heart palpitations.  Taking meds as directed w/o problems.  Denies medication side effects.    GAD- He is doing Ok overall.  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  He says he still just feels a little stressed and overwhelmed at times. He feels nervous and on edge more than half the days and feels like he is easily annoyed and irritable. He says he is not sure how well the Wellbutrin has really been working for him.  He would like to be tested for sleep apnea. We have tried to get him set up for sleep study last year. His stop bang questionnaire score was positive for 6 out of 8 questions. Unfortunately his insurance would not cover it at that time. He would like to try again this year and says he would be able to pay out of pocket partially for it. He is okay with that being scheduled at [facility].  Wanted to update me - one of son's has been dx with high functioning autism and ADHD.    Review of Systems     Objective:   Physical Exam  Constitutional: He is oriented to person, place, and time. He appears well-developed and well-nourished.  HENT:  Head: Normocephalic and atraumatic.  Cardiovascular: Normal rate, regular rhythm and normal heart sounds.   Pulmonary/Chest: Effort normal and breath sounds normal.  Neurological: He is alert and oriented to person, place, and time.  Skin: Skin is warm and dry.  Psychiatric: He has a normal mood and affect. His behavior is normal.        Assessment & Plan:  HTN - Well controlled. Continue current regimen. Follow up in  6 mo.   GAD- GAD-7 score of 9.  Discussed options.  Will change to sertraline and start to wean the Wellbutrin. Follow-up in one month. Will decrease Wellbutrin down to 150 mg.  Snoring - will place new order for sleep study at [facility].

## 2016-08-03 ENCOUNTER — Ambulatory Visit (INDEPENDENT_AMBULATORY_CARE_PROVIDER_SITE_OTHER): Payer: BC Managed Care – PPO | Admitting: Family Medicine

## 2016-08-03 ENCOUNTER — Encounter: Payer: Self-pay | Admitting: Family Medicine

## 2016-08-03 VITALS — BP 128/79 | HR 93 | Wt 259.0 lb

## 2016-08-03 DIAGNOSIS — R0683 Snoring: Secondary | ICD-10-CM | POA: Diagnosis not present

## 2016-08-03 DIAGNOSIS — F411 Generalized anxiety disorder: Secondary | ICD-10-CM | POA: Diagnosis not present

## 2016-08-03 NOTE — Progress Notes (Signed)
Subjective:    CC: Anxiety  HPI:  Follow-up anxiety-I last saw him we decided to start weaning the Wellbutrin since it was not effective and switch him to sertraline.Overall he's been doing well on the medication. He says he has felt a little bit more sedated a little bit more tired. Is been harder to exercise and go to the gym. But he does feel like it has really significantly decrease his irritability and has noticed he's been a little bit more joyful around his children. He did notice some jaw clenching the first week that he started it but says that went away. He wants another something he could take for energy besides a multivitamin.  Snoring with pos screen for OSA- sleep lab tried to contact him twice earlier this months. He says he will schedule it later this year.     Past medical history, Surgical history, Family history not pertinant except as noted below, Social history, Allergies, and medications have been entered into the medical record, reviewed, and corrections made.   Review of Systems: No fevers, chills, night sweats, weight loss, chest pain, or shortness of breath.   Objective:    General: Well Developed, well nourished, and in no acute distress.  Neuro: Alert and oriented x3, extra-ocular muscles intact, sensation grossly intact.  HEENT: Normocephalic, atraumatic  Skin: Warm and dry, no rashes. Cardiac: Regular rate and rhythm, no murmurs rubs or gallops, no lower extremity edema.  Respiratory: Clear to auscultation bilaterally. Not using accessory muscles, speaking in full sentences.   Impression and Recommendations:    GAD- Improving. GAD 7 score of 4 today, previous of 9.Much improved. We discussed continue medication versus changing it. For right now he would like to continue it. We'll just continue to keep an eye on the decreased energy levels. He would prefer to go back to 6 month checkup so I will see him in 6 months unless he has problems he can come in sooner.  He could certainly try an over-the-counter B12 for a month or 2 to see if it helps with energy level. The most important encouraged him to get back on track with exercise healthy diet and really working on quality sleep which he does struggle with.  Snoring - Will schedule he will schedule sleep study later this year.

## 2016-08-03 NOTE — Patient Instructions (Signed)
Can try B 12  1074m  Daily for energy.

## 2016-08-08 ENCOUNTER — Other Ambulatory Visit: Payer: Self-pay | Admitting: *Deleted

## 2016-08-08 DIAGNOSIS — F411 Generalized anxiety disorder: Secondary | ICD-10-CM

## 2016-08-08 MED ORDER — BUPROPION HCL ER (XL) 150 MG PO TB24: ORAL_TABLET | ORAL | 1 refills | Status: DC

## 2016-08-08 NOTE — Progress Notes (Signed)
Request for 90 day  supply

## 2016-10-31 ENCOUNTER — Other Ambulatory Visit: Payer: Self-pay | Admitting: Family Medicine

## 2017-01-08 ENCOUNTER — Other Ambulatory Visit: Payer: Self-pay | Admitting: *Deleted

## 2017-01-08 MED ORDER — SERTRALINE HCL 50 MG PO TABS: 50.0000 mg | ORAL_TABLET | Freq: Every day | ORAL | 1 refills | Status: DC

## 2017-01-11 ENCOUNTER — Encounter: Payer: BC Managed Care – PPO | Admitting: Family Medicine

## 2017-02-04 ENCOUNTER — Other Ambulatory Visit: Payer: Self-pay | Admitting: Family Medicine

## 2017-02-04 DIAGNOSIS — E785 Hyperlipidemia, unspecified: Secondary | ICD-10-CM

## 2017-02-04 DIAGNOSIS — F411 Generalized anxiety disorder: Secondary | ICD-10-CM

## 2017-02-04 DIAGNOSIS — I10 Essential (primary) hypertension: Secondary | ICD-10-CM

## 2017-02-15 ENCOUNTER — Ambulatory Visit (INDEPENDENT_AMBULATORY_CARE_PROVIDER_SITE_OTHER): Payer: BC Managed Care – PPO | Admitting: Family Medicine

## 2017-02-15 ENCOUNTER — Encounter: Payer: Self-pay | Admitting: Family Medicine

## 2017-02-15 VITALS — BP 109/73 | HR 76 | Ht 72.0 in | Wt 263.0 lb

## 2017-02-15 DIAGNOSIS — R5383 Other fatigue: Secondary | ICD-10-CM | POA: Diagnosis not present

## 2017-02-15 DIAGNOSIS — Z0000 Encounter for general adult medical examination without abnormal findings: Secondary | ICD-10-CM | POA: Diagnosis not present

## 2017-02-15 NOTE — Progress Notes (Signed)
Subjective:    Patient ID: Stephen Orozco, male    DOB: October 21, 1970, 46 y.o.   MRN: [id number]  HPI 6 her male comes in today for complete physical exam. He has a diagnosis of hypertension and anxiety. He feels  Like he is just been extremely fatigued and exhausted. Particularly over the last 6 months. These been so tired he has been able to make it to the gym. Is feeling excessively sleepy during the daytime he's been having intermittent but rare episodes of explosive diarrhea. He denies any chest pain or shortness of breath. He occasionally will feel dizzy if he stands up too quickly. He thinks that's more because of his blood pressure pill. He took be vitamin B12 for a while and even iron for a while to see if that would help with his energy and it did not.   Review of Systems  Comprehensive review systems is negative except for history of present illness.  BP 109/73   Pulse 76   Ht 6' (1.829 m)   Wt 263 lb (119.3 kg)   SpO2 98%   BMI 35.67 kg/m     No Known Allergies  Past Medical History:  Diagnosis Date  . Hyperlipidemia   . Hypertension     Past Surgical History:  Procedure Laterality Date  . APPENDECTOMY    . medial malleolar fixation Right    Ankle    Social History   Social History  . Marital status: Married    Spouse name: N/A  . Number of children: 2  . Years of education: N/A   Occupational History  . unemployed.     Social History Main Topics  . Smoking status: Former Smoker    Packs/day: 0.30    Years: 10.00    Types: Cigarettes    Quit date: 09/14/2012  . Smokeless tobacco: Never Used  . Alcohol use 1.0 - 1.5 oz/week    2 - 3 Standard drinks or equivalent per week  . Drug use: Unknown  . Sexual activity: Not on file   Other Topics Concern  . Not on file   Social History Narrative   1 half caff drink daily. No regular exercise. Wife is a Marine scientist. He is unemployed right now.     Family History  Problem Relation Age of Onset  . Alcohol  abuse Father   . Diabetes Father   . Hyperlipidemia Father   . Hypertension Father   . Gout Father   . Cancer    . Heart attack    . Depression    . Autism spectrum disorder Son   . ADD / ADHD Son     Outpatient Encounter Prescriptions as of 02/15/2017  Medication Sig  . buPROPion (WELLBUTRIN XL) 150 MG 24 hr tablet Take 1 tablet (150 mg total) by mouth every morning. APPOINTMENT NEEDED FOR FURTHER REFILLS  . lisinopril (PRINIVIL,ZESTRIL) 10 MG tablet Take 1 tablet (10 mg total) by mouth daily. TAKE 1 TABLET (10 MG TOTAL) BY MOUTH DAILY. APPOINTMENT NEEDED FOR FURTHER REFILLS  . pravastatin (PRAVACHOL) 40 MG tablet Take 1 tablet (40 mg total) by mouth daily. TAKE 1 TABLET (40 MG TOTAL) BY MOUTH DAILY. APPOINTMENT NEEDED FOR FURTHER REFILLS  . sertraline (ZOLOFT) 50 MG tablet Take 1 tablet (50 mg total) by mouth daily.   No facility-administered encounter medications on file as of 02/15/2017.           Objective:   Physical Exam  Constitutional: He is oriented to  person, place, and time. He appears well-developed and well-nourished.  HENT:  Head: Normocephalic and atraumatic.  Right Ear: External ear normal.  Left Ear: External ear normal.  Nose: Nose normal.  Mouth/Throat: Oropharynx is clear and moist.  Eyes: Conjunctivae and EOM are normal. Pupils are equal, round, and reactive to light.  Neck: Normal range of motion. Neck supple. No thyromegaly present.  Cardiovascular: Normal rate, regular rhythm, normal heart sounds and intact distal pulses.   Pulmonary/Chest: Effort normal and breath sounds normal.  Abdominal: Soft. Bowel sounds are normal. He exhibits no distension and no mass. There is no tenderness. There is no rebound and no guarding.  Musculoskeletal: Normal range of motion.  Lymphadenopathy:    He has no cervical adenopathy.  Neurological: He is alert and oriented to person, place, and time. He has normal reflexes.  Skin: Skin is warm and dry.  Psychiatric: He has  a normal mood and affect. His behavior is normal. Judgment and thought content normal.          Assessment & Plan:  cpe Keep up a regular exercise program and make sure you are eating a healthy diet Try to eat 4 servings of dairy a day, or if you are lactose intolerant take a calcium with vitamin D daily.  Your vaccines are up to date.   Extreme fatigued for about 6 months. Unclear etiology. We'll do some additional blood work today just to rule out deficiencies. I still really think we need to test him for sleep apnea. He had a positive screen on his stop bang previously but because his insurance wouldn't cover it we never did formal testing. So would strongly urge him to consider having this done. Mouth and have him cut his Zoloft in half just to see if that makes a difference as well. He did move his Zoloft bedtime thinking it could be causing some sleepiness but it really hasn't made a big difference.

## 2017-02-16 LAB — LIPID PANEL W/REFLEX DIRECT LDL
Cholesterol: 208 mg/dL — ABNORMAL HIGH (ref <200)
HDL: 39 mg/dL — ABNORMAL LOW (ref >40)
LDL-CHOLESTEROL: 138 mg/dL — AB
NON-HDL CHOLESTEROL (CALC): 169 mg/dL — AB (ref <130)
Total CHOL/HDL Ratio: 5.3 Ratio — ABNORMAL HIGH (ref <5.0)
Triglycerides: 172 mg/dL — ABNORMAL HIGH (ref <150)

## 2017-02-16 LAB — COMPLETE METABOLIC PANEL WITH GFR
ALT: 30 U/L (ref 9–46)
AST: 22 U/L (ref 10–40)
Albumin: 4.5 g/dL (ref 3.6–5.1)
Alkaline Phosphatase: 57 U/L (ref 40–115)
BILIRUBIN TOTAL: 0.6 mg/dL (ref 0.2–1.2)
BUN: 16 mg/dL (ref 7–25)
CHLORIDE: 99 mmol/L (ref 98–110)
CO2: 28 mmol/L (ref 20–31)
CREATININE: 1.07 mg/dL (ref 0.60–1.35)
Calcium: 9.5 mg/dL (ref 8.6–10.3)
GFR, Est African American: >89 mL/min (ref >=60)
GFR, Est Non African American: 83 mL/min (ref >=60)
GLUCOSE: 91 mg/dL (ref 65–99)
Potassium: 4.6 mmol/L (ref 3.5–5.3)
SODIUM: 135 mmol/L (ref 135–146)
TOTAL PROTEIN: 7.1 g/dL (ref 6.1–8.1)

## 2017-02-16 LAB — VITAMIN B12: VITAMIN B 12: 989 pg/mL (ref 200–1100)

## 2017-02-16 LAB — MAGNESIUM: Magnesium: 2.1 mg/dL (ref 1.5–2.5)

## 2017-02-16 LAB — TSH: TSH: 1.57 mIU/L (ref 0.40–4.50)

## 2017-02-16 LAB — HEMOGLOBIN A1C
Hgb A1c MFr Bld: 5.6 % (ref <5.7)
Mean Plasma Glucose: 114 mg/dL

## 2017-02-16 LAB — VITAMIN D 25 HYDROXY (VIT D DEFICIENCY, FRACTURES): VIT D 25 HYDROXY: 31 ng/mL (ref 30–100)

## 2017-02-16 LAB — FERRITIN: FERRITIN: 254 ng/mL (ref 20–380)

## 2017-03-19 ENCOUNTER — Other Ambulatory Visit: Payer: Self-pay | Admitting: Family Medicine

## 2017-03-19 DIAGNOSIS — F411 Generalized anxiety disorder: Secondary | ICD-10-CM

## 2017-03-19 DIAGNOSIS — E785 Hyperlipidemia, unspecified: Secondary | ICD-10-CM

## 2017-05-02 ENCOUNTER — Other Ambulatory Visit: Payer: Self-pay | Admitting: Family Medicine

## 2017-05-02 DIAGNOSIS — E785 Hyperlipidemia, unspecified: Secondary | ICD-10-CM

## 2017-05-20 ENCOUNTER — Ambulatory Visit (INDEPENDENT_AMBULATORY_CARE_PROVIDER_SITE_OTHER): Payer: BC Managed Care – PPO | Admitting: Family Medicine

## 2017-05-20 VITALS — BP 136/84 | HR 95 | Ht 72.0 in | Wt 264.0 lb

## 2017-05-20 DIAGNOSIS — Z111 Encounter for screening for respiratory tuberculosis: Secondary | ICD-10-CM | POA: Diagnosis not present

## 2017-05-20 NOTE — Progress Notes (Signed)
Agree with above 

## 2017-05-20 NOTE — Progress Notes (Signed)
   Subjective:    Patient ID: Stephen Orozco, male    DOB: Feb 02, 1971, 46 y.o.   MRN: [id number]  HPI  Pt is here for PPD placement.   Review of Systems     Objective:   Physical Exam        Assessment & Plan:  Pt tolerated injection in the left arm without complications. Advised to return in 48 hrs for reading.

## 2017-05-22 ENCOUNTER — Ambulatory Visit: Payer: BC Managed Care – PPO

## 2017-05-22 LAB — TB SKIN TEST
INDURATION: 0 mm
TB Skin Test: NEGATIVE

## 2017-05-23 ENCOUNTER — Ambulatory Visit: Payer: BC Managed Care – PPO

## 2017-07-12 ENCOUNTER — Telehealth: Payer: Self-pay

## 2017-07-12 ENCOUNTER — Telehealth: Payer: Self-pay | Admitting: Family Medicine

## 2017-07-12 NOTE — Telephone Encounter (Signed)
Call patient's wife as Audrey is still in the hospital and was asleep. She filled me in on what was going on. He will be seen by a psychiatrist this morning. He's never really been opened a therapist or counselor in the past but says he might be up into it now. Offered some local resources. Be happy to set him up if he would like. We have a therapist downstairs and then we also have Barnetta Chapel carrier [employer] he is also really good. Wife also requesting some support services for her as well and trying to help to with her husband who has depression and son who has autism.

## 2017-07-12 NOTE — Telephone Encounter (Signed)
Pt advised of recommendations.Stephen Orozco

## 2017-07-12 NOTE — Telephone Encounter (Signed)
Can decrease to every othr day for about 6 days and then stop.

## 2017-07-12 NOTE — Telephone Encounter (Signed)
Pt's wife called and stated that the pt was in the hospital last night. Pt's wife stated that the hospital told him to quit taking Wellbutrin. Pt's wife stated that they started him on buspar and sertraline. His wife wants to know if he needs to [place] off of Wellbutrin or just stop taking it? Please advise?

## 2017-07-15 ENCOUNTER — Telehealth: Payer: Self-pay

## 2017-07-15 NOTE — Telephone Encounter (Signed)
PT's spouse called concerned that patient is still having a hard time. Pt just started on Buspar, spouse wants to know if he can have something fast acting.  I gave her the crisis hotline so he can call and speak to someone now. [phone] She stated he is not suicidal, just having a really hard time. Instructed her to take him to the hospital if she feels he is in harm of hurting himself in any way. She would like a referral sent to [facility] for counseling. I spoke to [facility] downstairs and they are booking out till November.

## 2017-07-15 NOTE — Telephone Encounter (Signed)
Please let patient know that there but doubt until November downstairs. We could see if we can try to get him in with Belenda Cruise carrier if he is willing to try to [facility].

## 2017-07-15 NOTE — Telephone Encounter (Addendum)
He did see Belenda Cruise Carrier and she recommends Intensive outpatient therapy for Anxiety and Depression. His wife agreed to the [facility] outpatient program in [place].  I'm waiting for a call back from [place].

## 2017-07-16 ENCOUNTER — Encounter: Payer: Self-pay | Admitting: Family Medicine

## 2017-07-16 NOTE — Telephone Encounter (Signed)
I let a message with the patient this morning asking how he is feeling. I called [facility] again this morning and left a message for a return call.

## 2017-07-16 NOTE — Telephone Encounter (Signed)
[facility] called back and they can see him tomorrow October 3 rd at 3:30, [address] [place]. I called and advised patient of the appointment. I also advised him if his symptoms worsen to go to the ED.

## 2017-07-17 ENCOUNTER — Other Ambulatory Visit (HOSPITAL_COMMUNITY): Payer: BC Managed Care – PPO | Attending: Psychiatry | Admitting: Licensed Clinical Social Worker

## 2017-07-17 DIAGNOSIS — F411 Generalized anxiety disorder: Secondary | ICD-10-CM | POA: Diagnosis not present

## 2017-07-17 DIAGNOSIS — F332 Major depressive disorder, recurrent severe without psychotic features: Secondary | ICD-10-CM | POA: Insufficient documentation

## 2017-07-17 DIAGNOSIS — Z79899 Other long term (current) drug therapy: Secondary | ICD-10-CM | POA: Insufficient documentation

## 2017-07-18 ENCOUNTER — Other Ambulatory Visit (HOSPITAL_COMMUNITY): Payer: BC Managed Care – PPO
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
## 2017-07-19 ENCOUNTER — Other Ambulatory Visit (HOSPITAL_COMMUNITY): Payer: BC Managed Care – PPO | Admitting: Licensed Clinical Social Worker

## 2017-07-19 DIAGNOSIS — F411 Generalized anxiety disorder: Secondary | ICD-10-CM

## 2017-07-19 DIAGNOSIS — F332 Major depressive disorder, recurrent severe without psychotic features: Secondary | ICD-10-CM | POA: Diagnosis not present

## 2017-07-22 ENCOUNTER — Other Ambulatory Visit (HOSPITAL_COMMUNITY): Payer: BC Managed Care – PPO | Admitting: Licensed Clinical Social Worker

## 2017-07-22 ENCOUNTER — Encounter (HOSPITAL_COMMUNITY): Payer: Self-pay | Admitting: Psychiatry

## 2017-07-22 DIAGNOSIS — F332 Major depressive disorder, recurrent severe without psychotic features: Secondary | ICD-10-CM | POA: Diagnosis not present

## 2017-07-22 DIAGNOSIS — F411 Generalized anxiety disorder: Secondary | ICD-10-CM

## 2017-07-22 NOTE — Psych (Signed)
Behavioral Health Partial Program Assessment Note  Date: 07/22/2017 Name: Stephen Orozco MRN: [id number]  Chief Complaint: depression and anxiety  Subjective:still anxious with panic attacks and depressed but not suicidal  HPI:Stephen Orozco has been depressed and anxious off and on all his life.  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  He is teaching with very little guidance and held accountable for things he has had no preparation for.  Anxiety has increased considerably to the point of panic attacks which is new for him and depression reached a point of having suicidal thoughts.  He has been sad, no energy, no focus, no interest, no joy or pleasure, avoiding others, irritability, feelings of guilt.  Wife is supportive but has a hard time understanding depression and anxiety..  He has 3 boys aged 34 to 88 with one diagnosed with high functioning autism.  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  Finances are tight. Patient is a 46 y.o. Caucasian male presents with depression and anxiety.  Patient was enrolled in partial psychiatric program on 07/22/17.  Primary complaints include: anxiety, anxiety attacks, avoidance of crowds, depression worse, feeling depressed and feeling suicidal.  Onset of symptoms was gradual with gradually worsening course since that time. Psychosocial Stressors include the following: occupational.   I have reviewed the history as presented by the patient  Complaints of Pain: nonear Past Psychiatric History:  No inpatient , periodic outpatient   Substance Abuse History: none Use of Alcohol: not an issue Use of Caffeine: other not an issue Use of over the counter: not an issue  Past Surgical History:  Procedure Laterality Date  . APPENDECTOMY    . medial malleolar fixation Right    Ankle    Past Medical History:  Diagnosis Date  .  Hyperlipidemia   . Hypertension    Outpatient Encounter Prescriptions as of 07/22/2017  Medication Sig  . buPROPion (WELLBUTRIN XL) 150 MG 24 hr tablet TAKE 1 TABLET BY MOUTH EVERY MORNING.  Marland Kitchen lisinopril (PRINIVIL,ZESTRIL) 10 MG tablet Take 1 tablet (10 mg total) by mouth daily. TAKE 1 TABLET (10 MG TOTAL) BY MOUTH DAILY. APPOINTMENT NEEDED FOR FURTHER REFILLS  . pravastatin (PRAVACHOL) 40 MG tablet TAKE 1 TABLET (40 MG TOTAL) BY MOUTH DAILY.  Marland Kitchen sertraline (ZOLOFT) 50 MG tablet Take 1 tablet (50 mg total) by mouth daily.   No facility-administered encounter medications on file as of 07/22/2017.    No Known Allergies  Social History  Substance Use Topics  . Smoking status: Former Smoker    Packs/day: 0.30    Years: 10.00    Types: Cigarettes    Quit date: 09/14/2012  . Smokeless tobacco: Never Used  . Alcohol use 1.0 - 1.5 oz/week    2 - 3 Standard drinks or equivalent per week   Functioning Relationships: good support system, good relationship with spouse or significant other and good relationship with children Education: College       Please specify degree: graduated college Other Pertinent History: None Family History  Problem Relation Age of Onset  . Alcohol abuse Father   . Diabetes Father   . Hyperlipidemia Father   . Hypertension Father   . Gout Father   . Cancer Unknown   . Heart attack Unknown   . Depression Unknown   . Autism spectrum disorder Son   . ADD / ADHD Son  Review of Systems Constitutional: negative Eyes: negative Ears, nose, mouth, throat, and face: negative Respiratory: negative Cardiovascular: negative Gastrointestinal: negative Genitourinary: negative Integument/breast: negative Hematologic/lymphatic: negative Musculoskeletal: negative Neurological: negative Behavioral/Psych: depression and anxiety Endocrine: negative Allergic/Immunologic: negative  Objective:  There were no vitals filed for this visit.  Physical Exam: No exam  performed today, no exam necessary.  Mental Status Exam: Appearance:  Well groomed Psychomotor::  Within Normal Limits Attention span and concentration: Normal Behavior: calm, cooperative and adequate rapport can be established Speech:  normal pitch and normal volume Mood:  depressed and anxious Affect:  normal and mood-congruent Thought Process:  Coherent and Goal Directed Thought Content:  Logical Orientation:  person, place and time/date Cognition:  grossly intact Insight:  Intact Judgment:  Intact Estimate of Intelligence: Above average Fund of knowledge: Intact Memory: Recent and remote intact Abnormal movements: None Gait and station: Normal  Assessment:  Diagnosis: Severe major depression recurrent without psychosis.  Generalized anxiety disorder Indications for admission: inpatient care required if not in partial hospital program  Plan: patient enrolled in Partial Hospitalization Program  Treatment options and alternatives reviewed with patient and patient understands the above plan.   Comments: consider stimulant to help increase energy until the sertraline takes effect. Consider lorazepam until the sertraline takes effect.  Continue sertraline 100 mg daily for depression and anxiety.  Consider increasing buspirone to 10 mg tid .    Donnelly Angelica, MD

## 2017-07-22 NOTE — Psych (Signed)
   [facility] BH PHP THERAPIST PROGRESS NOTE  Stephen Orozco [id number]  Session Time: 9 am - 2 pm  Participation Level: Active  Behavioral Response: CasualAlertAnxious and Depressed  Type of Therapy: Group Therapy;Psychotherapy, Psychoeducation, Medication Group, Activity Therapy  Treatment Goals addressed: Coping  Interventions: CBT, DBT, Supportive and Reframing  Summary:  9:00 - 11:00: Pharmacist discussed medication with group and answered questions.  11:00 -12:15: Clinician led check-in regarding current stressors and situation, and review of patient completed daily inventory. Clinician utilized active listening and empathetic response and validated patient emotions. Clinician facilitated processing group on pertinent issues.  12:15 - 1:00 Reflection group: Patients encouraged to practice skills and interpersonal techniques or work on mindfulness and relaxation techniques. The importance of self-care and making skills part of a routine to increase usage were stressed.  1:00 - 1:50 Clinician introduced topic of "Distress Tolerance". Clinician discussed Self-Soothe and how to utilize. Pt's identified ways to incorporate each of the five senses in a relevant way to them.  1:50 - 2:00 Clinician led check-out. Clinician assessed for immediate needs, medication compliance and efficacy, and safety concerns.   Suicidal/Homicidal: Nowithout intent/plan  Therapist Response: [name] is a 46 y.o. male who presents with anxiety and depression symptoms. Patient arrived within time allowed and reports he is feeling "pretty high on anxiety." Patient rates his mood at an 5 on a 1-10 scale with 10 being great. Patient reports he had mixed weekend. Pt states he had a panic attack at his son's day care and rates it as the worst one he has experienced stating "I thought I was having a heart attack." Pt reports he was able to manage, however is not sure how. Pt reports his wife was a support to him and  he was able to do some hiking. Patient engaged in activity and discussion. Patient demonstrates some progress as evidenced by slight increased comfort. Pt is still visibly anxious and withdrawn/ Patient denies SI/HI/self-harm thoughts at the end of group.    Plan: Pt will continue in PHP while working on decreasing anxiety and depression symptoms, increase level of daily functioning, and increase ability to manage symptoms.   Diagnosis: Severe recurrent major depression without psychotic features ([place]) [F33.2]    1. Severe recurrent major depression without psychotic features ([place])   2. Generalized anxiety disorder      Lorin Glass, LCSW 07/22/2017

## 2017-07-22 NOTE — Psych (Signed)
   [facility] PHP THERAPIST PROGRESS NOTE  Stephen Orozco [id number]  Session Time: 9 am - 1 pm  Participation Level: Active  Behavioral Response: CasualAlertAnxious and Depressed  Type of Therapy: Group Therapy  Treatment Goals addressed: Coping  Interventions: CBT, DBT, Supportive and Reframing  Summary:  9:00 - 10:30 Clinician led check-in regarding current stressors and situation, and review of patient completed daily inventory. Clinician utilized active listening and empathetic response and validated patient emotions. Clinician facilitated processing group on pertinent issues.  10:30 -12:00: Clinician led discussion on "hard emotions" and specifically vulnerability. Group engaged in discussion on how vulnerability can present, what makes it difficult to be vulnerable, and what the benefits of vulnerability are. Group discussed what makes them feel vulnerable and how they can practice opening themselves up. 12:00 - 12:50: Clinician led discussion on self care and how to reframe the necessity of taking care of ourselves. Group viewed TED talk, "How to practice emotional first aid" and discussed how to increase self care.  12:50 - 1:00 Clinician led check-out. Clinician assessed for immediate needs, medication compliance and efficacy, and safety concerns.   Suicidal/Homicidal: Nowithout intent/plan  Therapist Response: [name] is a 46 y.o. male who presents with anxiety and depression symptoms. Patient arrived within time allowed and reports he is feeling "pretty high on anxiety." Patient rates his mood at an 3 on a 1-10 scale with 10 being great. Patient reports he had a difficult time getting here this morning and that his anxiety is "getting out of control." Pt is able to identify two coping skills, one of sitting on the back porch, and another of using hot/cold temperatures to self soothe. Pt reports moving to a new job with new experiences and very little support has spiked his  anxiety and he is feeling very overwhelmed. Patient engaged in activity and discussion. Patient demonstrates some progress as evidenced by engaging meaningfully in his first session. Patient denies SI/HI/self-harm thoughts at the end of group.    Plan: Pt will continue in PHP while working on decreasing anxiety and depression symptoms, increase level of daily functioning, and increase ability to manage symptoms.   Diagnosis: Severe episode of recurrent major depressive disorder, without psychotic features ([place]) [F33.2]    1. Severe episode of recurrent major depressive disorder, without psychotic features ([place])   2. Generalized anxiety disorder      Lorin Glass, LCSW 07/22/2017

## 2017-07-23 ENCOUNTER — Other Ambulatory Visit (HOSPITAL_COMMUNITY): Payer: BC Managed Care – PPO | Admitting: Licensed Clinical Social Worker

## 2017-07-23 ENCOUNTER — Other Ambulatory Visit (HOSPITAL_COMMUNITY): Payer: BC Managed Care – PPO | Admitting: Occupational Therapy

## 2017-07-23 DIAGNOSIS — F411 Generalized anxiety disorder: Secondary | ICD-10-CM

## 2017-07-23 DIAGNOSIS — F332 Major depressive disorder, recurrent severe without psychotic features: Secondary | ICD-10-CM | POA: Diagnosis not present

## 2017-07-23 MED ORDER — LORAZEPAM 0.5 MG PO TABS: 0.5000 mg | ORAL_TABLET | Freq: Three times a day (TID) | ORAL | 0 refills | Status: DC | PRN

## 2017-07-23 NOTE — Therapy (Signed)
[place], [place], [address] Phone: [phone]   Fax:  [phone]  Occupational Therapy Evaluation & Treatment  Patient Details  Name: Stephen Orozco MRN: [id number] Date of Birth: 12/24/1970 Referring Provider: Dr. Donnelly Angelica  Encounter Date: 07/23/2017      OT End of Session - 07/23/17 1634    Visit Number 1   Number of Visits 6   Date for OT Re-Evaluation 08/16/17   Authorization Type BCBS   OT Start Time 1030   OT Stop Time 1130   OT Time Calculation (min) 60 min   Activity Tolerance Patient tolerated treatment well   Behavior During Therapy [facility] for tasks assessed/performed      Past Medical History:  Diagnosis Date  . Hyperlipidemia   . Hypertension     Past Surgical History:  Procedure Laterality Date  . APPENDECTOMY    . medial malleolar fixation Right    Ankle    There were no vitals filed for this visit.      Subjective Assessment - 07/23/17 1634    Currently in Pain? No/denies           [facility] OT Assessment - 07/23/17 1634      Assessment   Diagnosis Major Depressive Disorder   Referring Provider Dr. Donnelly Angelica   Onset Date --  chronic     Precautions   Precautions None     Restrictions   Weight Bearing Restrictions No     Balance Screen   Has the patient fallen in the past 6 months No   Has the patient had a decrease in activity level because of a fear of falling?  No   Is the patient reluctant to leave their home because of a fear of falling?  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  There is clear connection between behavior and interest/personal goals.  Motivation is intact.  Assessment:  Patient demonstrates autonomy-oriented motivation type.  Pt has clear connection between behavior and interest/personal goals. Patient will benefit from occupational therapy intervention in order to improve time management, financial management, stress management, job readiness skills, social skills, sleep hygiene, exercise and healthy eating habits, and health management skills and other psychosocial skills needed for preparation to return to full time community living and to be a productive community member.     Plan:  Patient will participate in skilled occupational therapy sessions individually or in a group setting to improve coping skills, psychosocial skills, and emotional skills required to return to prior level of function as a productive community member. Treatment will be 1-2 times per week for 2-6 weeks.       OT Group: Communication Skills-Active Listening and Open Discussion   S:  "I can be passive, assertive or aggressive when I need  to be, depending on the situation."   O:  Patient actively participated in the following skilled occupational therapy treatment session this date:             Social and communication skills: Pt participated in group focusing on active listening and open communication via dialogue. Also discussed assertiveness and appropriate communication skills/behavior. Pt participated in small group/pair activity-participants given a subject to discuss. One person talks about the subject for 3 minutes while the other person practices active listening; then the listener recapped what the speaker said but did not  include opinions or personal thoughts. Pairs/groups then switch roles for the next subject. At end of small group activity all participants reconvene for large group discussion on a potentially controversial topic. Today's topic was social media and whether it is good or bad. Pt was actively engaged during both small and large group activities and contributed to the discussion.    A:  Patient participated in skilled occupational therapy group for social and communication skills this date.  Patient was engaged during activities and provided thoughts and feelings on session.      P:  Continue participation in skilled occupational therapy groups  1-2 times per week for 2 weeks in order to gain the necessary skills needed to return to full time community living and learn effective coping strategies to be a productive community resident.                   OT Short Term Goals - 07/23/17 1636      OT SHORT TERM GOAL #1   Title Patient will be educated on strategies to improve psychosocial skills needed to participate fully in all daily, work, and leisure activities.   Time 3   Period Weeks   Status New   Target Date 08/16/17     OT SHORT TERM GOAL #2   Title Patient will be educated on a HEP and independent with implementation of HEP.   Time 3   Period Weeks   Status New     OT SHORT TERM GOAL #3   Title Patient will independently apply psychosocial skills and coping mechanisms to her daily activities in order to function independently.   Time 3   Period Weeks   Status New                  Plan - 07/23/17 1635    Occupational performance deficits (Please refer to evaluation for details): ADL's;IADL's;Rest and Sleep;Work;Leisure;Social Participation   Rehab Potential Good   OT Frequency 2x / week   OT Duration --  3 weeks   OT Treatment/Interventions Self-care/ADL training;Patient/family education;Other (comment)  coping skills training, psychosocial skills  training, community reintegration   Clinical Decision Making Limited treatment options, no task modification necessary   Consulted and Agree with Plan of Care Patient      Patient will benefit from skilled therapeutic intervention in order to improve the following deficits and impairments:  Other (comment) (decreased coping skills, decreased psychosocial skills)  Visit Diagnosis: Severe recurrent major depression without psychotic features ([place])  Generalized anxiety disorder    Problem List Patient Active Problem List   Diagnosis Date Noted  . Tobacco abuse 11/20/2013  . Hyperlipidemia 11/20/2013  . Obesity, Class II, BMI 35-39.9, with comorbidity 04/22/2012  . Anxiety 02/26/2012  . Essential hypertension, benign 02/26/2012   Guadelupe Sabin, OTR/L  [phone] 07/23/2017, 4:37 PM  [facility]IAL [facility]PITAL[facility] PRO[facility] [place]  [place], [place], [address] Phone: [phone]   Fax:  [phone]  Name: Stephen Orozco MRN: [id number] Date of Birth: [birth year]

## 2017-07-23 NOTE — Progress Notes (Signed)
Progress note  Will try lorazepam 0.5 mg tid as needed for panic or anxiety attacks.  He would feel better having something but does not plan to take it if he does not have to.  The anxiety attack last Friday scared him so that he is having some anticipatory anxiety.  Feeling more relaxed in the program.

## 2017-07-24 ENCOUNTER — Encounter (HOSPITAL_COMMUNITY): Payer: Self-pay

## 2017-07-24 ENCOUNTER — Inpatient Hospital Stay: Payer: BC Managed Care – PPO | Admitting: Family Medicine

## 2017-07-24 ENCOUNTER — Other Ambulatory Visit (HOSPITAL_COMMUNITY): Payer: BC Managed Care – PPO | Admitting: Licensed Clinical Social Worker

## 2017-07-24 VITALS — BP 116/74 | HR 81 | Ht 70.75 in | Wt 256.0 lb

## 2017-07-24 DIAGNOSIS — F411 Generalized anxiety disorder: Secondary | ICD-10-CM

## 2017-07-24 DIAGNOSIS — F332 Major depressive disorder, recurrent severe without psychotic features: Secondary | ICD-10-CM

## 2017-07-24 MED ORDER — AMPHETAMINE-DEXTROAMPHETAMINE 10 MG PO TABS: 10.0000 mg | ORAL_TABLET | Freq: Two times a day (BID) | ORAL | 0 refills | Status: DC

## 2017-07-24 NOTE — Progress Notes (Signed)
Progress note      Came in with a note from his wife who is a Pharmacist, hospital who outlined her concerns about possible ADHD and she did report the usual procrastination, starting and not finishing tasks, trouble concentrating for any length of time, trouble organizing when there is a lot of material.  His anxiety makes it worse and feeds into the poor organization.  He tends to play down the symptoms and that is why she wrote the note.  He had a lot of trouble getting through college even though he is very bright as well.   Plan is to try Adderall 10 mg bid as an augmentation for the anti depressant as well as a treatment for likely ADHD
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
## 2017-07-25 ENCOUNTER — Other Ambulatory Visit (HOSPITAL_COMMUNITY): Payer: BC Managed Care – PPO | Admitting: Occupational Therapy

## 2017-07-25 ENCOUNTER — Encounter (HOSPITAL_COMMUNITY): Payer: Self-pay | Admitting: Occupational Therapy

## 2017-07-25 ENCOUNTER — Other Ambulatory Visit (HOSPITAL_COMMUNITY): Payer: BC Managed Care – PPO | Admitting: Licensed Clinical Social Worker

## 2017-07-25 DIAGNOSIS — F332 Major depressive disorder, recurrent severe without psychotic features: Secondary | ICD-10-CM

## 2017-07-25 DIAGNOSIS — F411 Generalized anxiety disorder: Secondary | ICD-10-CM

## 2017-07-25 NOTE — Psych (Signed)
   [facility] BH PHP THERAPIST PROGRESS NOTE  Bernarr Longsworth [id number]  Session Time: 9 am - 2 pm  Participation Level: Active  Behavioral Response: CasualAlertAnxious and Depressed  Type of Therapy: Group Therapy;Psychotherapy, Psychoeducation, Activity Therapy, Spiritual Care  Treatment Goals addressed: Coping  Interventions: CBT, DBT, Supportive and Reframing  Summary:  9:00 - 10:30 Clinician led check-in regarding current stressors and situation, and review of patient completed daily inventory. Clinician utilized active listening and empathetic response and validated patient emotions. Clinician facilitated processing group on pertinent issues.  10:30 -12:00 Spiritual care group  12:00 - 12:45 Reflection group: Patients encouraged to practice skills and interpersonal techniques or work on mindfulness and relaxation techniques. The importance of self-care and making skills part of a routine to increase usage were stressed.  12:45 - 1:50 Clinician introduced topic of "Feelings and Emotions". Patients discussed how hard emotions and feelings can be to identify, accept, and handle. Clinician led feelings Ines Bloomer game to aid in identification of feelings.  1:50 - 2:00 Clinician led check-out. Clinician assessed for immediate needs, medication compliance and efficacy, and safety concerns.   Suicidal/Homicidal: Nowithout intent/plan  Therapist Response: Daimion Adamcik is a 46 y.o. male who presents with anxiety and depression symptoms. Patient arrived within time allowed and reports he is feeling "not so hot today." Patient rates his mood at a 4 on a 1-10 scale with 10 being great. Patient reports that one of his children is sick and the other is having mood outburst, taking a tole on his mental health. Patient shared that his wife has been very supportive during this time. Patient engaged in activity and discussion. Patient demonstrates some progress as evidenced by increased comfort in group.  Patient denies SI/HI/self-harm at the end of group.   Plan: Pt will continue in PHP while working on decreasing anxiety and depression symptoms, increase level of daily functioning, and increase ability to manage symptoms.   Diagnosis: Severe recurrent major depression without psychotic features ([place]) [F33.2]    1. Severe recurrent major depression without psychotic features ([place])   2. Generalized anxiety disorder     Royetta Crochet, LPCA 07/25/2017

## 2017-07-25 NOTE — Progress Notes (Signed)
07/24/2017 10:30-12:00.  Pt attended spirituality group facilitated by chaplain Jerene Pitch, MDiv    Spirituality group focused on topic of self care.  Patients responded to topic in facilitated discussion.  They then engaged in activity where they selected quotes around self care that they identified with and one that they did not.  Engaged in discussion and reflection around why they chose their particular quotes and how these connect to their practices of care.   Group facilitation drew on elements of client-centered, humanist / existential and psycho-dynamic group process.   Stephen Orozco was present throughout group.  He engaged in group discussion and activity.  Reported that used to engage in self care by going hiking.  However, he does not often take time to care for himself, though his wife encourages him to.  He feels some guilt around leaving her with the children, as they are often more responsive to [name] - especially their son with autism.  Identified with an Althia Forts quote of "stepping out of your own shadow."  Related that he had been casting a shadow over himself by not reaching out for help and described coming to partial group as taking a step out from this shadow.   He engaged with other group members, identifying with another group member who is parenting a child on autism spectrum.     WL / [place], MDiv

## 2017-07-25 NOTE — Psych (Signed)
   [facility] BH PHP THERAPIST PROGRESS NOTE  [name] [id number]  Session Time: 9 am - 2 pm  Participation Level: Active  Behavioral Response: CasualAlertAnxious and Depressed  Type of Therapy: Group Therapy;Psychotherapy, Psychoeducation, Activity Therapy, OT  Treatment Goals addressed: Coping  Interventions: CBT, DBT, Supportive and Reframing  Summary:  9:00 - 10:30 Clinician led check-in regarding current stressors and situation, and review of patient completed daily inventory. Clinician utilized active listening and empathetic response and validated patient emotions. Clinician facilitated processing group on pertinent issues.  10:30 -11:30: OT Group  11:30-12:00: Clinician led group discussion on decision making and how to make informed decisions.  12:00 - 12:45 Reflection group: Patients encouraged to practice skills and interpersonal techniques or work on mindfulness and relaxation techniques. The importance of self-care and making skills part of a routine to increase usage were stressed.  12:45 - 1:50 Clinician led discussion on friendship and support systems. Cln provided psychoeducation on how to build and foster strong relationships.  1:50 - 2:00 Clinician led check-out. Clinician assessed for immediate needs, medication compliance and efficacy, and safety concerns.    Suicidal/Homicidal: Nowithout intent/plan  Therapist Response: Stephen Orozco is a 46 y.o. male who presents with anxiety and depression symptoms. Patient arrived within time allowed and reports he is feeling "really depressed." Patient rates his mood at an 5 on a 1-10 scale with 10 being great. Patient reports he is very stressed regarding work and deciding whether or not to go back. Pt is ruminating in group about the past and decisions he could have made about careers. Patient engaged in activity and discussion. Patient demonstrates some progress as evidenced by continuing to participate in group. Patient  denies SI/HI/self-harm thoughts at the end of group.    Plan: Pt will continue in PHP while working on decreasing anxiety and depression symptoms, increase level of daily functioning, and increase ability to manage symptoms.   Diagnosis: Severe recurrent major depression without psychotic features ([place]) [F33.2]    1. Severe recurrent major depression without psychotic features ([place])      Lorin Glass, LCSW 07/25/2017

## 2017-07-25 NOTE — Therapy (Signed)
[place], [place], [address] Phone: [phone]   Fax:  [phone]  Occupational Therapy Treatment  Patient Details  Name: Stephen Orozco MRN: [id number] Date of Birth: 12/26/1970 Referring Provider: Dr. Donnelly Angelica  Encounter Date: 07/25/2017      OT End of Session - 07/25/17 1305    Visit Number 2   Number of Visits 6   Date for OT Re-Evaluation 08/16/17   Authorization Type BCBS   OT Start Time 1030   OT Stop Time 1130   OT Time Calculation (min) 60 min   Activity Tolerance Patient tolerated treatment well   Behavior During Therapy [facility] for tasks assessed/performed      Past Medical History:  Diagnosis Date  . Hyperlipidemia   . Hypertension     Past Surgical History:  Procedure Laterality Date  . APPENDECTOMY    . medial malleolar fixation Right    Ankle  . MENISCUS REPAIR Left 2010    There were no vitals filed for this visit.      Subjective Assessment - 07/25/17 1305    Currently in Pain? No/denies            [facility] OT Assessment - 07/25/17 1305      Assessment   Diagnosis Major Depressive Disorder     Precautions   Precautions None        OT Treatment Session: Social and Communication Skills  S:  "I think my self-esteem is pretty low."   O:  Patient actively participated in the following skilled occupational therapy treatment session this date:             Social and communication skills: Pt participated in discussion of social and communication skills including the importance of social skills in various settings. Pt completed self-esteem alphabet worksheet identifying good or positive qualities/characteristics about himself.  Also discussed variables that affect self-esteem. Pt then participated in activity-"Elephant in the Room" focusing on sharing a difficult or taboo subject or situation and working through a problem-solving process with a small group.  Pt was asked to identify if the situation was one he/she could C, I, or A-control, influence, or accept. Pt then actively participated in discussion with group working through the 4 W's-why is this happening? What is being done about it? Who can resolve it? and When can it be resolved?   A:  Patient participated in skilled occupational therapy group for social and communication skills this date.  Patient was engaged and open to ideas and strategies introduced.     P:  Continue participation in skilled occupational therapy groups  1-2 times per week for 2 weeks in order to gain the necessary skills needed to return to full time community living and learn effective coping strategies to be a productive community resident. Next session: physical and mental health and wellness.                OT Short Term Goals - 07/25/17 1306      OT SHORT TERM GOAL #1   Title Patient will be educated on strategies to improve psychosocial skills needed to participate fully in all daily, work, and leisure activities.   Time 3   Period Weeks   Status On-going     OT SHORT TERM GOAL #2   Title Patient will be educated on a HEP and independent with implementation of HEP.   Time 3   Period Weeks   Status  On-going     OT SHORT TERM GOAL #3   Title Patient will independently apply psychosocial skills and coping mechanisms to her daily activities in order to function independently.   Time 3   Period Weeks   Status On-going                Patient will benefit from skilled therapeutic intervention in order to improve the following deficits and impairments:  Other (comment) (decreased coping skills, decreased psychosocial skills)  Visit Diagnosis: Severe recurrent major depression without psychotic features ([place])  Generalized anxiety disorder    Problem List Patient Active Problem List   Diagnosis Date Noted  . Tobacco abuse 11/20/2013  . Hyperlipidemia 11/20/2013  . Obesity, Class  II, BMI 35-39.9, with comorbidity 04/22/2012  . Anxiety 02/26/2012  . Essential hypertension, benign 02/26/2012   Guadelupe Sabin, OTR/L  [phone] 07/25/2017, 1:06 PM  [facility] PARTIAL HOSPITALIZATION PROGRAM [place], [place], [address] Phone: [phone]   Fax:  [phone]  Name: Donnelle Olmeda MRN: [id number] Date of Birth: 02-02-1971

## 2017-07-26 ENCOUNTER — Other Ambulatory Visit (HOSPITAL_COMMUNITY): Payer: BC Managed Care – PPO | Admitting: Licensed Clinical Social Worker

## 2017-07-26 DIAGNOSIS — F332 Major depressive disorder, recurrent severe without psychotic features: Secondary | ICD-10-CM | POA: Diagnosis not present

## 2017-07-26 DIAGNOSIS — F411 Generalized anxiety disorder: Secondary | ICD-10-CM

## 2017-07-26 NOTE — Psych (Signed)
   [facility] BH PHP THERAPIST PROGRESS NOTE  Stephen Orozco [id number]  Session Time: 9 am - 2 pm  Participation Level: Active  Behavioral Response: CasualAlertAnxious and Depressed  Type of Therapy: Group Therapy;Psychotherapy, Psychoeducation, Activity Therapy, OT  Treatment Goals addressed: Coping  Interventions: CBT, DBT, Supportive and Reframing  Summary:  9:00 - 10:30 Clinician led check-in regarding current stressors and situation, and review of patient completed daily inventory. Clinician utilized active listening and empathetic response and validated patient emotions. Clinician facilitated processing group on pertinent issues.  10:30 -11:30 Clinician continued discussion on relationships and group discussed how to make and maintain friendships.  12:00 - 12:45 Reflection group: Patients encouraged to practice skills and interpersonal techniques or work on mindfulness and relaxation techniques. The importance of self-care and making skills part of a routine to increase usage when stressed.  12:45 - 1:50 Clinician introduced "Mindfulness". Clinician showed TedTalk on mindfulness and the group discussed. Patients identified what types of activities would help them practice mindfulness.  1:50 - 2:00 Clinician led check-out. Clinician assessed for immediate needs, medication compliance and efficacy, and safety concerns.    Suicidal/Homicidal: Nowithout intent/plan  Therapist Response: [name] is a 46 y.o. male who presents with anxiety and depression symptoms. Patient arrived within time allowed and reports he is feeling "awake." Patient rates his mood at a 5 on a 1-10 scale with 10 being great. Patient reports he slept better last night and was able to talk to his wife about some issues they are having. Patient engaged in activity and discussion. Patient demonstrates some progress as evidenced by stating that he wants to learn new skills to help him deal with his anxiety related  to job stress and issues with his wife. Patient denies SI/HI/self-harm thoughts at the end of group.    Plan: Pt will continue in PHP while working on decreasing anxiety and depression symptoms, increase level of daily functioning, and increase ability to manage symptoms.   Diagnosis: Severe recurrent major depression without psychotic features ([place]) [F33.2]    1. Severe recurrent major depression without psychotic features ([place])   2. Generalized anxiety disorder     Lorin Glass, LCSW 07/26/2017

## 2017-07-29 ENCOUNTER — Other Ambulatory Visit (HOSPITAL_COMMUNITY): Payer: BC Managed Care – PPO | Admitting: Licensed Clinical Social Worker

## 2017-07-29 DIAGNOSIS — F332 Major depressive disorder, recurrent severe without psychotic features: Secondary | ICD-10-CM | POA: Diagnosis not present

## 2017-07-29 DIAGNOSIS — F411 Generalized anxiety disorder: Secondary | ICD-10-CM
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
## 2017-07-29 NOTE — Progress Notes (Signed)
Progress note      Mr Sawaya is doing better with anxiety and focus.  He notices the stimulant gives him energy, focus,follow through and motivation.  He uses the lorazepam sparingly and it also helps.  Still likely to quit his job as it creates too much stress.  Things with his wife seems better.  Plan:  Continue the Adderall 10 mg daily bid, lorazepam 0.5 mg tid prn, sertraline 50 mg daily and consider increase to 100 mg for depression though he is feeling less depressed.

## 2017-07-29 NOTE — Psych (Signed)
   [facility] BH PHP THERAPIST PROGRESS NOTE  Stephen Orozco [id number]  Session Time: 9 am - 2 pm  Participation Level: Active  Behavioral Response: CasualAlertAnxious and Depressed  Type of Therapy: Group Therapy;Psychotherapy, Psychoeducation, Activity Therapy, Medication Group  Treatment Goals addressed: Coping  Interventions: CBT, DBT, Supportive and Reframing  Summary:  9:00 - 11:00 Pharmacist discussed medication with group and answered questions.  11:00 -12:00: Clinician led check-in regarding current stressors and situation, and review of patient completed daily inventory. Clinician utilized active listening and empathetic response and validated patient emotions. Clinician facilitated processing group on pertinent issues.  12:00 - 12:45 Reflection group: Patients encouraged to practice skills and interpersonal techniques or work on mindfulness and relaxation techniques. The importance of self-care and making skills part of a routine to increase usage were stressed.  12:45- 1:50 Clinician introduced topic of "Cognitive Distortions". Patients identified cognitive distortions they often have and ways to combat these distortions.  1:50 - 2:00 Clinician led check-out. Clinician assessed for immediate needs, medication compliance and efficacy, and safety concerns.     Suicidal/Homicidal: Nowithout intent/plan  Therapist Response: Stephen Orozco is a 46 y.o. male who presents with anxiety and depression symptoms. Patient arrived within time allowed and reports he is feeling "still anxious." Patient rates his mood at a 5 on a 1-10 scale with 10 being great. Patient reports he's noticed he has higher anxiety outside of group, possibly due to lack of structure and demands on him. Pt shares spending time with children and having increased energy to be able to get things done due to new medication. Pt identifies he is spiraling about work. Patient engaged in activity and discussion. Patient  demonstrates some progress as evidenced by recognizing some areas in which he could use radical acceptance. Patient denies SI/HI/self-harm at the end of group.    Plan: Pt will continue in PHP while working on decreasing anxiety and depression symptoms, increase level of daily functioning, and increase ability to manage symptoms.   Diagnosis: Severe recurrent major depression without psychotic features ([place]) [F33.2]    1. Severe recurrent major depression without psychotic features ([place])   2. Generalized anxiety disorder     Lorin Glass, LCSW 07/29/2017

## 2017-07-30 ENCOUNTER — Other Ambulatory Visit (HOSPITAL_COMMUNITY): Payer: BC Managed Care – PPO | Admitting: Occupational Therapy

## 2017-07-30 ENCOUNTER — Other Ambulatory Visit: Payer: Self-pay | Admitting: Family Medicine

## 2017-07-30 ENCOUNTER — Other Ambulatory Visit (HOSPITAL_COMMUNITY): Payer: BC Managed Care – PPO | Admitting: Licensed Clinical Social Worker

## 2017-07-30 DIAGNOSIS — F332 Major depressive disorder, recurrent severe without psychotic features: Secondary | ICD-10-CM | POA: Diagnosis not present

## 2017-07-30 DIAGNOSIS — I10 Essential (primary) hypertension: Secondary | ICD-10-CM

## 2017-07-30 DIAGNOSIS — F411 Generalized anxiety disorder: Secondary | ICD-10-CM

## 2017-07-30 NOTE — Therapy (Signed)
[place], [place], [address] Phone: [phone]   Fax:  [phone]  Occupational Therapy Treatment  Patient Details  Name: Stephen Orozco MRN: [id number] Date of Birth: Feb 28, 1971 Referring Provider: Dr. Donnelly Angelica  Encounter Date: 07/30/2017      OT End of Session - 07/30/17 1557    Visit Number 3   Number of Visits 6   Date for OT Re-Evaluation 08/16/17   Authorization Type BCBS   OT Start Time 1030   OT Stop Time 1130   OT Time Calculation (min) 60 min   Activity Tolerance Patient tolerated treatment well   Behavior During Therapy [facility] for tasks assessed/performed      Past Medical History:  Diagnosis Date  . Hyperlipidemia   . Hypertension     Past Surgical History:  Procedure Laterality Date  . APPENDECTOMY    . medial malleolar fixation Right    Ankle  . MENISCUS REPAIR Left 2010    There were no vitals filed for this visit.      Subjective Assessment - 07/30/17 1557    Currently in Pain? No/denies            [facility] OT Assessment - 07/30/17 1557      Assessment   Diagnosis Major Depressive Disorder     Precautions   Precautions None        OT Group Session: Physical and Mental Health and Wellness:    S: "How you treat your body can affect both physical and mental health."   O: Physical and mental health and wellness session completed with emphasis on connecting the two areas, effects of self-esteem and confidence building, how to improve physical and mental health, and goal setting. Patient provided with education on building positive physical and mental health practices to improve physical, emotional, and social well-being including exercise habits, nutrition, sleep, hand hygiene, and stress reduction.     A: Pt participated in physical and mental health and wellness occupational therapy treatment session this date within the [facility] program.  Pt participated  in group activity of "Telestrations" game focusing on illustrating various words or concepts connected to physical and mental health and wellness. Pt participated in discussion and was receptive to education on the links between physical health and mental health, mind and body connections, and identifying ways to improve upon current health habits affecting mental and physical health and wellness. Pt identified various strategies to combat depression and ways to improve current lifestyle habits. Strategies provided to assist in improving physical and mental health balance including exercise, nutrition, stress management, sleep habits, and recognizing when changes are necessary.    P: Continue participation in skilled occupational therapy groups 1-2 times per week for 2 weeks in order to gain the necessary skills needed to return to full time community living and learn effective coping strategies to be a productive community resident.              OT Short Term Goals - 07/25/17 1306      OT SHORT TERM GOAL #1   Title Patient will be educated on strategies to improve psychosocial skills needed to participate fully in all daily, work, and leisure activities.   Time 3   Period Weeks   Status On-going     OT SHORT TERM GOAL #2   Title Patient will be educated on a HEP and independent with implementation of HEP.   Time 3   Period  Weeks   Status On-going     OT SHORT TERM GOAL #3   Title Patient will independently apply psychosocial skills and coping mechanisms to her daily activities in order to function independently.   Time 3   Period Weeks   Status On-going                Patient will benefit from skilled therapeutic intervention in order to improve the following deficits and impairments:  Other (comment) (decreased coping skills, decreased psychosocial skills)  Visit Diagnosis: Severe recurrent major depression without psychotic features ([place])  Generalized anxiety  disorder    Problem List Patient Active Problem List   Diagnosis Date Noted  . Tobacco abuse 11/20/2013  . Hyperlipidemia 11/20/2013  . Obesity, Class II, BMI 35-39.9, with comorbidity 04/22/2012  . Anxiety 02/26/2012  . Essential hypertension, benign 02/26/2012   Guadelupe Sabin, OTR/L  [phone] 07/30/2017, 3:58 PM  [place], [place], [address] Phone: [phone]   Fax:  [phone]  Name: Stephen Orozco MRN: [id number] Date of Birth: June 22, 1971

## 2017-07-31 ENCOUNTER — Other Ambulatory Visit (HOSPITAL_COMMUNITY): Payer: BC Managed Care – PPO | Admitting: Licensed Clinical Social Worker

## 2017-07-31 DIAGNOSIS — F411 Generalized anxiety disorder: Secondary | ICD-10-CM

## 2017-07-31 DIAGNOSIS — F332 Major depressive disorder, recurrent severe without psychotic features: Secondary | ICD-10-CM | POA: Diagnosis not present

## 2017-07-31 NOTE — Psych (Signed)
   [facility] BH PHP THERAPIST PROGRESS NOTE  Stephen Orozco [id number]  Session Time: 9 am - 2 pm  Participation Level: Active  Behavioral Response: CasualAlertAnxious and Depressed  Type of Therapy: Group Therapy; Psychotherapy, Psychoeducation, Activity Therapy, Spiritual Care  Treatment Goals addressed: Coping  Interventions: CBT, DBT, Supportive and Reframing  Summary:  9:00 - 10:30 Clinician led check-in regarding current stressors and situation, and review of patient completed daily inventory. Clinician utilized active listening and empathetic response and validated patient emotions. Clinician facilitated processing group on pertinent issues.  10:30 -12:00 Spiritual care group  12:00 - 12:45 Reflection group: Patients encouraged to practice skills and interpersonal techniques or work on mindfulness and relaxation techniques. The importance of self-care and making skills part of a routine to increase usage were stressed.  12:45 - 1:50 Relaxation group: Cln Jan Fireman led yoga group focused on retraining the body's response to stress.  1:50 - 2:00 Clinician led check-out. Clinician assessed for immediate needs, medication compliance and efficacy, and safety concerns.  concerns.                                                                                                                                                           Suicidal/Homicidal: Nowithout intent/plan  Therapist Response: Stephen Orozco is a 46 y.o. male who presents with anxiety and depression symptoms. Patient arrived within time allowed and report he is feeling "my normal blah." Patient rates his mood at a 6 on a scale of 1-10 being great. Patient reports he had a relaxing afternoon and spent time with his family. Patient shared that although he was unable to complete his afternoon plans, he was thankful for the time he spent with his family. Patient engaged in activity and discussion. Patient demonstrates some  progress as evidenced by sharing how he was able to relieve some anxiety he experienced during group. Patient shared that he thought of a funny video and mentally went to a place where he knew his emotions would not be present. Patient denies SI/HI/self-harm thoughts at the end of group.    Plan: Pt will continue in PHP while working on decreasing anxiety and depression symptoms, increase level of daily functioning, and increase ability to manage symptoms.   Diagnosis: Severe recurrent major depression without psychotic features ([place]) [F33.2]    1. Severe recurrent major depression without psychotic features ([place])   2. Generalized anxiety disorder     Lorin Glass, LCSW 07/31/2017

## 2017-07-31 NOTE — Psych (Signed)
   [facility] BH PHP THERAPIST PROGRESS NOTE  Dahir Ayer [id number]  Session Time: 9 am - 2 pm  Participation Level: Active  Behavioral Response: CasualAlertAnxious and Depressed  Type of Therapy: Group Therapy; Psychotherapy, Psychoeducation, Activity Therapy, OT  Treatment Goals addressed: Coping  Interventions: CBT, DBT, Supportive and Reframing  Summary:  9:00 - 10:30 Clinician led check-in regarding current stressors and situation, and review of patient completed daily inventory. Clinician utilized active listening and empathetic response and validated patient emotions. Clinician facilitated processing group on pertinent issues.  10:30 -11:30: OT Group  11:30-12:00: Clinician continued topic of "Cognitive Distortions" from the previous day. Pts continued to identify common cognitive distortions they often experience and ways to combat those distortions.  12:00 - 12:45 Reflection group: Patients encouraged to practice skills and interpersonal techniques or work on mindfulness and relaxation techniques. The importance of self-care and making skills part of a routine to increase usage were stressed.  12:45 - 1:50 Clinician continued with topic of "cognitive distortions." Group watched TedTalk "Choice, Happiness, and Spaghetti Sauce" and discussed how there are multiple ways to think about a situation/problem, no only one "right" way. Patients identified areas of life where they can start to see things differently to help change their thinking.  1:50 - 2:00 Clinician led check-out. Clinician assessed for immediate needs, medication compliance and efficacy, and safety concerns.                                                                                                                                                           Suicidal/Homicidal: Nowithout intent/plan  Therapist Response: Stephen Orozco is a 46 y.o. male who presents with anxiety and depression symptoms. Patient  arrived within time allowed and reports he is feeling "a little better than yesterday." Patient rates his mood at a 5.5 on a 1-10 scale with 10 being great. Pt reports struggles with his mom this morning as she consistently oversteps her boundaries. Pt declines to work on strategies to manage stating "she is who she is." Pt reports continued anxiety about work and is trying to pen a letter ot his boss with little progress.  Patient engaged in activity and discussion. Patient demonstrates limited progress as evidenced by poor insight into his thought processes when it comes to difficult topics. Patient denies SI/HI/self-harm at the end of group.    Plan: Pt will continue in PHP while working on decreasing anxiety and depression symptoms, increase level of daily functioning, and increase ability to manage symptoms.   Diagnosis: Severe recurrent major depression without psychotic features ([place]) [F33.2]    1. Severe recurrent major depression without psychotic features ([place])   2. Generalized anxiety disorder     Lorin Glass, LCSW 07/31/2017

## 2017-07-31 NOTE — Progress Notes (Signed)
GROUP NOTE - spiritual care group 07/31/2017 10:30 - 12:00 ?Facilitated by Simone Curia, MDiv   Group focused on topic of strength. ?Group members reflected on what thoughts and feelings emerge when they hear this topic  Engaged in facilitated dialog around topic.    Activity drew on Adlerian, brief CBT and narrative framework  Stephen Orozco was present throughout group.  He engaged in group discussion voluntarily.  At beginning of group, described strength as "centeredness" and spoke of being grounded in self.  Stated that he was becoming anxious during group.  Related this to processing discussion earlier and "realizing there is more than I thought there was."  Stephen Orozco said he had PRN medication for his anxiety, but did not want to take this.  Rather, he worked through anxiety in group time, noting that he shifted his thoughts to positive things.   He was not specific about what felt overwhelming for him from earlier processing. Stephen Orozco spoke about feeling "useless" with family and wishing he could care for them better.  He received affirmation from group around his ability to speak about what he is feeling and utilize coping.   [facility] Chaplain Jerene Pitch, MDiv  24 hour oncall page - [phone]  Office [phone]

## 2017-08-01 ENCOUNTER — Other Ambulatory Visit (HOSPITAL_COMMUNITY): Payer: BC Managed Care – PPO | Admitting: Occupational Therapy

## 2017-08-01 ENCOUNTER — Encounter (HOSPITAL_COMMUNITY): Payer: Self-pay

## 2017-08-01 ENCOUNTER — Other Ambulatory Visit (HOSPITAL_COMMUNITY): Payer: BC Managed Care – PPO | Admitting: Licensed Clinical Social Worker

## 2017-08-01 VITALS — BP 122/86 | HR 73 | Ht 72.0 in | Wt 257.0 lb

## 2017-08-01 DIAGNOSIS — F411 Generalized anxiety disorder: Secondary | ICD-10-CM

## 2017-08-01 DIAGNOSIS — F332 Major depressive disorder, recurrent severe without psychotic features: Secondary | ICD-10-CM

## 2017-08-01 NOTE — Therapy (Signed)
[place], [place], [address] Phone: [phone]   Fax:  [phone]  Occupational Therapy Treatment  Patient Details  Name: Stephen Orozco MRN: [id number] Date of Birth: 07/05/1971 Referring Provider: Dr. Donnelly Angelica  Encounter Date: 08/01/2017      OT End of Session - 08/01/17 1230    Visit Number 4   Number of Visits 6   Date for OT Re-Evaluation 08/16/17   Authorization Type BCBS   OT Start Time 1035   OT Stop Time 1140   OT Time Calculation (min) 65 min   Activity Tolerance Patient tolerated treatment well   Behavior During Therapy [facility] for tasks assessed/performed      Past Medical History:  Diagnosis Date  . Hyperlipidemia   . Hypertension     Past Surgical History:  Procedure Laterality Date  . APPENDECTOMY    . medial malleolar fixation Right    Ankle  . MENISCUS REPAIR Left 2010    There were no vitals filed for this visit.      Subjective Assessment - 08/01/17 1230    Currently in Pain? No/denies            [facility] OT Assessment - 08/01/17 1230      Assessment   Diagnosis Major Depressive Disorder     Precautions   Precautions None       OT Group: Stress Management S:  "I try to do a lot of things but I don't know that one in particular really works." O:  Patient actively participated in the following skilled occupational therapy treatment session this date.  Stress management-discussed what stress is and how it affects one's life. Pt completed the How Stressed am I? worksheet with a high stress level score.  Discussed pts personal stress symptoms and current coping mechanisms or ways of dealing with stress. Pt actively participated in [place] activity-identifying types of music as relaxing, fun, or as having no effect on mood. Pt engaged in discussion of the effects of stress on health, both physical and mental health, including acute and chronic  stress. Pt completed ladder stress hierarchy worksheet and participated in brainstorming stress management techniques to employ including relaxation techniques, emotional regulation, exercise, music, and nutrition.  A:  Patient participated in skilled occupational therapy group for stress management skills this date.  Patient was engaged and open to strategies introduced.  P:  Continue participation in skilled occupational therapy groups  1-2 times per week for 2 weeks in order to gain the necessary skills needed to return to full time community living and learn effective coping strategies to be a productive community resident. Follow up on HEP for finding a stress management technique to begin incorporating into a daily routine, targeting at least one stressor listed on hierarchy worksheet.            OT Short Term Goals - 07/25/17 1306      OT SHORT TERM GOAL #1   Title Patient will be educated on strategies to improve psychosocial skills needed to participate fully in all daily, work, and leisure activities.   Time 3   Period Weeks   Status On-going     OT SHORT TERM GOAL #2   Title Patient will be educated on a HEP and independent with implementation of HEP.   Time 3   Period Weeks   Status On-going     OT SHORT TERM GOAL #3   Title Patient  will independently apply psychosocial skills and coping mechanisms to her daily activities in order to function independently.   Time 3   Period Weeks   Status On-going                Patient will benefit from skilled therapeutic intervention in order to improve the following deficits and impairments:  Other (comment) (decreased coping skills, decreased psychosocial skills)  Visit Diagnosis: Severe recurrent major depression without psychotic features ([place])  Generalized anxiety disorder    Problem List Patient Active Problem List   Diagnosis Date Noted  . Tobacco abuse 11/20/2013  . Hyperlipidemia 11/20/2013  . Obesity,  Class II, BMI 35-39.9, with comorbidity 04/22/2012  . Anxiety 02/26/2012  . Essential hypertension, benign 02/26/2012   Guadelupe Sabin, OTR/L  [phone] 08/01/2017, 12:31 PM  [place], [place], [address] Phone: [phone]   Fax:  [phone]  Name: Stephen Orozco MRN: [id number] Date of Birth: 06-Jul-1971

## 2017-08-01 NOTE — Progress Notes (Signed)
Met with patient who presented with flat affect, depressed mood and stated he was doing better today after what he described as a "bad day yesterday".  Patient stated he had really bad anxiety on yesterday but was able to manage with use of deep breathing and controlling his breathing to avoid a "panic attack".  Patient discussed a lot of anxiety related to al the things he is currently facing, with being out of work, helping to care for children but also feeling like a burden at times. Patient discussed he is often the one doing a lot, not all of the home care and caring for his children and described a picture that often leaves him not able or willing to share with his wife when he is frustrated with her or overwhelmed, using the example she goes for a walk to help with managing stress and he does not have that option.  Patient shared when he gets to this point it typically improves after they are able to discuss but this is not always the case and admits he just does not say anything when he gets frustrated and just keeps it to himself.  Patient denied any suicidal or homicidal ideations today, no auditory or visual hallucinations and reports his focus has been improved with use of Adderall now.  Patient discussed that he had waited" too late" to take the second Adderall on yesterday and felt this may have attributed to his increased depression at that time.  Discussed how patient is currently taking medications and discussed his need to take earlier and on a consistent basis to help.  Patient scored his current level of depression a 6, anxiety a 5, and hopelessness a 9 on a scale of 0-10 with 0 being none and 10 the worst he could experience.  Patient stated his hopelessness was up due to what occurred the previous day and worrying about long-term being able to manage his depression.  Discussed patient's acknowledged use of coping skills when anxiety increases of if has a return to thoughts of wanting to harm  self or others, which he denies at this time or since prior to hospitalization.  Patient will continue to monitor medications and take all as prescribed with adjustments to schedules.  Patient to keep Dr. Lovena Le and [facility] staff informed if symptoms worsen or begins to have any thoughts of wanting to harm self or others and how medication changes/adjustments are working.  Patient stable at this time and returned to [facility] group.  Denies any current suicidal or homicidal ideations.

## 2017-08-02 ENCOUNTER — Other Ambulatory Visit (HOSPITAL_COMMUNITY): Payer: BC Managed Care – PPO | Admitting: Licensed Clinical Social Worker

## 2017-08-02 DIAGNOSIS — F411 Generalized anxiety disorder: Secondary | ICD-10-CM

## 2017-08-02 DIAGNOSIS — F332 Major depressive disorder, recurrent severe without psychotic features: Secondary | ICD-10-CM

## 2017-08-02 NOTE — Psych (Signed)
   [facility] BH PHP THERAPIST PROGRESS NOTE  Stephen Orozco [id number]  Session Time: 9 am - 2 pm  Participation Level: Active  Behavioral Response: CasualAlertAnxious and Depressed  Type of Therapy: Group Therapy; Psychotherapy, Psychoeducation, Activity Therapy, OT  Treatment Goals addressed: Coping  Interventions: CBT, DBT, Supportive and Reframing  Summary:  9:00 - 10:15 Clinician led check-in regarding current stressors and situation, and review of patient completed daily inventory. Clinician utilized active listening and empathetic response and validated patient emotions. Clinician facilitated processing group on pertinent issues.  10:30 -11:30: OT Group  11:30 -12:15: Nursing  12:15-1:00: Reflection group: Patients encouraged to practice skills and interpersonal techniques or work on mindfulness and relaxation techniques. The importance of self-care and making skills part of a routine to increase usage were stressed.  1:00 - 1:50 Clinician introduced topic of "Grounding". Patients discussed how to use techniques for grounding during emotionally charged times or when feeling dissociated. Patients practiced some ground techniques.  1:50 - 2:00 Clinician led check-out. Clinician assessed for immediate needs, medication compliance and efficacy, and safety concerns.                                                                                                                                                           Suicidal/Homicidal: Nowithout intent/plan  Therapist Response: Stephen Orozco is a 46 y.o. male who presents with anxiety and depression symptoms. Patient arrived within time allowed and reports he is feeling "hopeless." Patient rates his mood at 5 on a 1-10 scale with 10 being great. Patient reports he had a relaxing evening. Patient shared that he took a nap and cooked dinner with wife, but felt unproductive. Patient stated that he is worried about his son because he was  been sick for the past 3 weeks. Patient engaged in activity and discussion. Patient demonstrates some progress as evidenced by stating grounding techniques learned in group today that he is going to try next time his anxiety increases. Patient denies SI/HI/self-harm thoughts at the end of group.    Plan: Pt will continue in PHP while working on decreasing anxiety and depression symptoms, increase level of daily functioning, and increase ability to manage symptoms.   Diagnosis: Severe recurrent major depression without psychotic features ([place]) [F33.2]    1. Severe recurrent major depression without psychotic features ([place])   2. Generalized anxiety disorder     Lorin Glass, LCSW 08/02/2017

## 2017-08-05 ENCOUNTER — Other Ambulatory Visit (HOSPITAL_COMMUNITY): Payer: Self-pay

## 2017-08-05 ENCOUNTER — Other Ambulatory Visit (HOSPITAL_COMMUNITY): Payer: Self-pay | Admitting: Psychiatry

## 2017-08-05 MED ORDER — SERTRALINE HCL 100 MG PO TABS: 100.0000 mg | ORAL_TABLET | Freq: Every day | ORAL | 2 refills | Status: DC

## 2017-08-05 MED ORDER — BUSPIRONE HCL 10 MG PO TABS: 10.0000 mg | ORAL_TABLET | Freq: Two times a day (BID) | ORAL | 2 refills | Status: DC

## 2017-08-05 MED ORDER — AMPHETAMINE-DEXTROAMPHETAMINE 10 MG PO TABS: 10.0000 mg | ORAL_TABLET | Freq: Two times a day (BID) | ORAL | 0 refills | Status: DC

## 2017-08-05 NOTE — Psych (Signed)
   [facility] BH PHP THERAPIST PROGRESS NOTE  Stephen Orozco [id number]  Session Time: 9 am - 1 pm  Participation Level: Active  Behavioral Response: CasualAlertAnxious and Depressed  Type of Therapy: Group Therapy; Psychotherapy, Psychoeducation  Treatment Goals addressed: Coping  Interventions: CBT, DBT, Supportive and Reframing  Summary:  9:00 -10:00: Clinician led check-in regarding current stressors and situation, and review of patient completed daily inventory. Clinician utilized active listening and empathetic response and validated patient emotions. Clinician facilitated processing group on pertinent issues.  10:00 - 11:00: Clinician reviewed dialectics and discussed "Opposites to Balance" handout. Group shared ways in which these opposites present and what makes them difficult to balance. Cln reminded group of "And" as a useful tool to remind [name] about joining "opposites."  11:00 - 12:00: Clinician led psychoeducation group on how the using or not using assertiveness techniques can affect our sense of self and what prevents [name] from increasing assertiveness.  12:00 - 12:50 Clinician led psychotherapy group on "A Bill of Assertive Rights" and why it is important to reaffirm our right to choice. Group members identified which "right" they struggle most with.  12:50 - 1:00 Clinician led check-out. Clinician assessed for immediate needs, medication compliance and efficacy, and safety concerns.                                                                                                                                                          Suicidal/Homicidal: Nowithout intent/plan  Therapist Response: [name] is a 46 y.o. male who presents with anxiety and depression symptoms. Patient arrived within time allowed and reports he is feeling "angry." Patient rates his mood at a 6 on a scale of 1-10 with 10 being great. Patient reports that he had a productive afternoon. Patient  stated that he helped do yard work at his mother's house and was able to go to the store and buy warmer clothes for his sons. Patient shared that this morning his wife made a comment towards him which caused him to feel angry. Pt shares that often he pushes down his feelings, however this morning did not.  Patient engaged in activity and discussion. Patient demonstrates some progress as evidenced by sharing ways he was able to get out of his angry mood and by getting in touch with his feelings. Patient denies SI/HI/self-harm thoughts at the end of group.    Plan: Pt will continue in PHP while working on decreasing anxiety and depression symptoms, increase level of daily functioning, and increase ability to manage symptoms.   Diagnosis: Severe recurrent major depression without psychotic features ([place]) [F33.2]    1. Severe recurrent major depression without psychotic features ([place])   2. Generalized anxiety disorder     Lorin Glass, LCSW 08/05/2017

## 2017-08-06 ENCOUNTER — Other Ambulatory Visit (HOSPITAL_COMMUNITY): Payer: BC Managed Care – PPO | Admitting: Licensed Clinical Social Worker

## 2017-08-06 ENCOUNTER — Other Ambulatory Visit (HOSPITAL_COMMUNITY): Payer: BC Managed Care – PPO | Admitting: Occupational Therapy

## 2017-08-06 DIAGNOSIS — F332 Major depressive disorder, recurrent severe without psychotic features: Secondary | ICD-10-CM

## 2017-08-06 DIAGNOSIS — F411 Generalized anxiety disorder: Secondary | ICD-10-CM

## 2017-08-06 NOTE — Therapy (Addendum)
[place], [place], [address] Phone: [phone]   Fax:  [phone]  Occupational Therapy Treatment  Patient Details  Name: Stephen Orozco MRN: [id number] Date of Birth: [birth year] Referring Provider: Dr. Donnelly Angelica  Encounter Date: 08/06/2017      OT End of Session - 08/06/17 1242    Visit Number 5   Number of Visits 6   Date for OT Re-Evaluation 08/16/17   Authorization Type BCBS   OT Start Time 1030   OT Stop Time 1135   OT Time Calculation (min) 65 min   Activity Tolerance Patient tolerated treatment well   Behavior During Therapy [facility] for tasks assessed/performed      Past Medical History:  Diagnosis Date  . Hyperlipidemia   . Hypertension     Past Surgical History:  Procedure Laterality Date  . APPENDECTOMY    . medial malleolar fixation Right    Ankle  . MENISCUS REPAIR Left 2010    There were no vitals filed for this visit.      Subjective Assessment - 08/06/17 1242    Currently in Pain? No/denies            [facility] OT Assessment - 08/06/17 1242      Assessment   Diagnosis Major Depressive Disorder     Precautions   Precautions None         OT Treatment Group: Sleep Hygiene   S:  "I used to do this relaxation routine every night and it really helped."  O:  Patient actively participated in the following skilled occupational therapy treatment session this date: Sleep hygiene - Began session with sleep hygiene questionnaire. Pt completed questionnaire and participated in discussion. Group then played Sleep Hygiene Taboo game, pt actively participated and engaged in game. Discussed importance of a routine leading up to bedtime, and educated on factors influencing sleep. Patient marginally engaged during session. Pt participated in group discussion of factors that interfere with sleep and lifestyle factors promoting sleep. Discussed importance of exercise for  the body's metabolism and promoting healthy sleep, as well as foods that can help or hinder sleep.  A:  Patient participated in skilled occupational therapy group for sleep hygiene skills this date.  Patient was actively engaged during session and appears open to strategies introduced.  P:  Continue participation in skilled occupational therapy groups  1-2 times per week in order to gain the necessary skills needed to return to full time community living and learn effective coping strategies to be a productive community resident. Next session: time management            OT Short Term Goals - 07/25/17 1306      OT SHORT TERM GOAL #1   Title Patient will be educated on strategies to improve psychosocial skills needed to participate fully in all daily, work, and leisure activities.   Time 3   Period Weeks   Status On-going     OT SHORT TERM GOAL #2   Title Patient will be educated on a HEP and independent with implementation of HEP.   Time 3   Period Weeks   Status On-going     OT SHORT TERM GOAL #3   Title Patient will independently apply psychosocial skills and coping mechanisms to her daily activities in order to function independently.   Time 3   Period Weeks   Status On-going  Patient will benefit from skilled therapeutic intervention in order to improve the following deficits and impairments:  Other (comment) (decreased coping skills, decreased psychosocial skills)  Visit Diagnosis: Severe recurrent major depression without psychotic features ([place])  Generalized anxiety disorder    Problem List Patient Active Problem List   Diagnosis Date Noted  . Tobacco abuse 11/20/2013  . Hyperlipidemia 11/20/2013  . Obesity, Class II, BMI 35-39.9, with comorbidity 04/22/2012  . Anxiety 02/26/2012  . Essential hypertension, benign 02/26/2012   Guadelupe Sabin, OTR/L  [phone] 08/06/2017, 12:43 PM  [facility] PARTIAL  HOSPITALIZATION PROGRAM [place], [place], [address] Phone: [phone]   Fax:  [phone]  Name: Stephen Orozco MRN: [id number] Date of Birth: November 11, 1970

## 2017-08-07 ENCOUNTER — Other Ambulatory Visit (HOSPITAL_COMMUNITY): Payer: BC Managed Care – PPO | Admitting: Licensed Clinical Social Worker

## 2017-08-07 DIAGNOSIS — F332 Major depressive disorder, recurrent severe without psychotic features: Secondary | ICD-10-CM

## 2017-08-07 DIAGNOSIS — F411 Generalized anxiety disorder: Secondary | ICD-10-CM

## 2017-08-07 NOTE — Psych (Signed)
[facility] BH PHP THERAPIST PROGRESS NOTE  Stephen Orozco [id number]  Session Time: 9 am - 2 pm  Participation Level: Active  Behavioral Response: CasualAlertAnxious and Depressed  Type of Therapy: Group Therapy; Psychotherapy, Psychoeducation, Activity Therapy, OT  Treatment Goals addressed: Coping  Interventions: CBT, DBT, Supportive and Reframing  Summary:  9:00 - 10:30 Clinician led check-in regarding current stressors and situation, and review of patient completed daily inventory. Clinician utilized active listening and empathetic response and validated patient emotions. Clinician facilitated processing group on pertinent issues.  10:30 -11:30: OT Group  11:30-12:15: Clinician led psychoeducation group on resentment and how that emotion affects [name]. Group members discussed resentments and ways they have found to deal with them.  12:15 - 1:00: Reflection group: Patients encouraged to practice skills and interpersonal techniques or work on mindfulness and relaxation techniques. The importance of self-care and making skills part of a routine to increase usage were stressed.  1:00 - 1:50 Clinician continued topic of "Distress Tolerance". Group discussed "STOP" and "TIP" and how/when patients can employ these methods to help. Patients identified when these techniques may be helpful in their personal lives.  1:50 - 2:00 Clinician led check-out. Clinician assessed for immediate needs, medication compliance and efficacy, and safety concerns.                                                                                                                                                          Suicidal/Homicidal: Nowithout intent/plan  Therapist Response: Donie Lemelin is a 46 y.o. male who presents with anxiety and depression symptoms. Patient arrived within time allowed and reports he is feeling "pretty anxious." Patient rates his mood at 6 on a 1-10 scale with 10 being great. Pt shares he  received an email from his boss which did not address some of his concerns, however did state he had plenty of time and there was no rush to make a decision about returning to work. 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 Pt spent yesterday home with his son who was ill. Pt reports feeling pressure to decide about work, even though he recognizes this pressure is from himself. Patient engaged in activity and discussion. Patient demonstrates some progress as evidenced by sharing he was able to go out and about this weekend and when overwhelmed he managed by stepping outside of the store and taking his PRN medication.  Patient denies SI/HI/self-harm thoughts at the end of group.    Plan: Pt will continue in PHP while working on decreasing anxiety and depression symptoms, increase level of daily functioning, and increase ability to manage symptoms.   Diagnosis: Severe recurrent major depression without psychotic features ([name]) [  F33.2]    1. Severe recurrent major depression without psychotic features ([name])   2. Generalized anxiety disorder     Lorin Glass, LCSW 08/07/2017

## 2017-08-07 NOTE — Psych (Signed)
   [facility] BH PHP THERAPIST PROGRESS NOTE  Stephen Orozco [id number]  Session Time: 9 am - 2 pm  Participation Level: Active  Behavioral Response: CasualAlertAnxious and Depressed  Type of Therapy: Group Therapy; Psychotherapy, Psychoeducation, Activity Therapy, Spiritual Care  Treatment Goals addressed: Coping  Interventions: CBT, DBT, Supportive and Reframing  Summary:  9:00 - 10:30 Clinician led check-in regarding current stressors and situation, and review of patient completed daily inventory. Clinician utilized active listening and empathetic response and validated patient emotions. Clinician facilitated processing group on pertinent issues.  10:30 -12:00 Spiritual care group  12:00 - 12:45 Reflection group: Patients encouraged to practice skills and interpersonal techniques or work on mindfulness and relaxation techniques. The importance of self-care and making skills part of a routine to increase usage were stressed.  12:45 - 1:50 Relaxation group: Cln Jan Fireman led yoga group focused on retraining the body's response to stress.  1:50 - 2:00 Clinician led check-out. Clinician assessed for immediate needs, medication compliance and efficacy, and safety concerns.                                                                                                                                                          Suicidal/Homicidal: Nowithout intent/plan  Therapist Response: [name] is a 46 y.o. male who presents with anxiety and depression symptoms. Patient arrived within time allowed and reports he is feeling "a lot better." Patient rates his mood at a 6 on a scale of 1-10 with 10 being great. Patient reports he had a panic attack last night but was able to take medication to help with the attack. Patient shared that he made dinner, hung out with his kids, and talked with his wife about moving forward in his career. 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  Pt reports turning off notifications for email alerts on his phone in order to focus on self. Patient engaged in activity and discussion. Patient demonstrates some progress as evidenced by starting to let go of things that are not in his control. Patient denies SI/HI/self-harm at the end of group.     Plan: Pt will continue in PHP while working on decreasing anxiety and depression symptoms, increase level of daily functioning, and increase ability to manage symptoms.   Diagnosis: Severe recurrent major depression without psychotic features ([place]) [F33.2]    1. Severe recurrent major depression without psychotic features ([place])   2. Generalized anxiety disorder     Royetta Crochet, LPCA 08/07/2017

## 2017-08-08 ENCOUNTER — Other Ambulatory Visit (HOSPITAL_COMMUNITY): Payer: BC Managed Care – PPO | Admitting: Licensed Clinical Social Worker

## 2017-08-08 ENCOUNTER — Other Ambulatory Visit (HOSPITAL_COMMUNITY): Payer: BC Managed Care – PPO | Admitting: Occupational Therapy

## 2017-08-08 DIAGNOSIS — F332 Major depressive disorder, recurrent severe without psychotic features: Secondary | ICD-10-CM | POA: Diagnosis not present

## 2017-08-08 DIAGNOSIS — F411 Generalized anxiety disorder: Secondary | ICD-10-CM

## 2017-08-08 MED ORDER — SERTRALINE HCL 100 MG PO TABS: 200.0000 mg | ORAL_TABLET | Freq: Every day | ORAL | 1 refills | Status: DC

## 2017-08-08 MED ORDER — BUSPIRONE HCL 10 MG PO TABS: 10.0000 mg | ORAL_TABLET | Freq: Two times a day (BID) | ORAL | 2 refills | Status: DC

## 2017-08-08 NOTE — Progress Notes (Addendum)
Spiritual care group 08/07/2017 10:30-12:00 ?Facilitated by by Simone Curia.   ?  Spiritual care group focused on topic of "community," exploring group member's current experience of community, what they value and what they hope for in community. Group engaged in facilitated discussion and then chose pieces of art or pictures that represented their current experience of community and what they long for. Group facilitation drew on Narrative and Adlerian frameworks as well as brief CBT.  Stephen Orozco was present throughout group.  Alert and oriented, he engaged in group discussion freely.  Stephen Orozco was talkative during group but easily redirectable as facilitator made space for others to speak.  Described communities he had been a part of earlier in life.   Stated he valued diversity and that this was easier for him when he was younger as he found himself around diverse communities in his work or at school. Identified "acceptance" as a value in these communities. Described difficulty creating community now outside of work.     WL Cutler, MDiv

## 2017-08-08 NOTE — Therapy (Signed)
[place], [place], [address] Phone: [phone]   Fax:  [phone]  Occupational Therapy Treatment  Patient Details  Name: Stephen Orozco MRN: [id number] Date of Birth: [birth year] Referring Provider: Dr. Donnelly Angelica  Encounter Date: 08/08/2017      OT End of Session - 08/08/17 1320    Visit Number 6   Number of Visits 6   Date for OT Re-Evaluation 08/16/17   Authorization Type BCBS   OT Start Time 1030   OT Stop Time 1135   OT Time Calculation (min) 65 min   Activity Tolerance Patient tolerated treatment well   Behavior During Therapy [facility] for tasks assessed/performed      Past Medical History:  Diagnosis Date  . Hyperlipidemia   . Hypertension     Past Surgical History:  Procedure Laterality Date  . APPENDECTOMY    . medial malleolar fixation Right    Ankle  . MENISCUS REPAIR Left 2010    There were no vitals filed for this visit.      Subjective Assessment - 08/08/17 1320    Currently in Pain? No/denies            [facility] OT Assessment - 08/08/17 1320      Assessment   Diagnosis Major Depressive Disorder     Precautions   Precautions None     OT Treatment Session: Time Management  S: "I like to have a plan but I struggle with my needs and others needs."   O: Time management session completed with emphasis on skills required, effective versus ineffective time management, importance of structure, along with tips and strategies for success. Patient provided with education on the effects of time management in regards to improving physical, emotional, and social well-being including improved ability for focus, decision-making skills, success in work, and social commitments, as well as benefits of reduced stress and the experience of greater success in all aspects of daily life.  A: Pt participated in time management occupational therapy treatment session this date. Pt  actively engaged in Minute-to-Win-it activities focusing on team building, communication, and time management strategies. Pt also participated in timed puzzle activity with focus on time management and looking at the "big picture" in correlation to a daily/weekly/monthly schedule being the "big picture" of planning and managing one's time. Pt actively engaged in worksheets and discussion identifying time wasters in their daily schedule and activities that could be incorporated when time wasters are eliminated. Pt participated in problem solving on planning and working towards small goals. Pt provided with handout outlining 10 strategies for improved time management.   P: Pt provided with time management strategies to implement during her daily schedule to assist in improving her balance between self-care, work, leisure, and PHP program tasks. OT will follow up with pt on success or struggles with implementation of learned strategies in 1 week.                      OT Short Term Goals - 07/25/17 1306      OT SHORT TERM GOAL #1   Title Patient will be educated on strategies to improve psychosocial skills needed to participate fully in all daily, work, and leisure activities.   Time 3   Period Weeks   Status On-going     OT SHORT TERM GOAL #2   Title Patient will be educated on a HEP and independent with implementation of HEP.  Time 3   Period Weeks   Status On-going     OT SHORT TERM GOAL #3   Title Patient will independently apply psychosocial skills and coping mechanisms to her daily activities in order to function independently.   Time 3   Period Weeks   Status On-going                Patient will benefit from skilled therapeutic intervention in order to improve the following deficits and impairments:  Other (comment) (decreased coping skills, decreased psychosocial skills)  Visit Diagnosis: Severe recurrent major depression without psychotic features  ([place])  Generalized anxiety disorder    Problem List Patient Active Problem List   Diagnosis Date Noted  . Tobacco abuse 11/20/2013  . Hyperlipidemia 11/20/2013  . Obesity, Class II, BMI 35-39.9, with comorbidity 04/22/2012  . Anxiety 02/26/2012  . Essential hypertension, benign 02/26/2012    Guadelupe Sabin, OTR/L  [phone] 08/08/2017, 1:21 PM  [facility] HOSPITALIZATION PROGRAM [place], [place], [address] Phone: [phone]   Fax:  [phone]  Name: Stephen Orozco MRN: [id number] Date of Birth: 1971/09/30

## 2017-08-08 NOTE — Progress Notes (Signed)
Spent time with the patient reviewing medications and mood symptoms.  I spent time with him learning about his social life and building some rapport.  Spent time with him aligning on his goals of reducing anxiety and preventing recurrent depressive episodes.  I educated him on SSRI, Lorazepam, BuSpar, and Adderall.  He was appreciative of the medication education.  He reports that he uses lorazepam very sparingly as he understands this is the emergency anxiety medicine.  Throughout our conversation, I noticed that he continues to ruminate about past stressors and past behaviors, and attempts to make sense of his depressive symptoms, to try to further understand how and why the symptoms returned.  He reports that he has noticed his mood and anxiety have been better on Zoloft as compared to Wellbutrin.  He notes that he is able to get out in public more and it is easier for him to drive.  He reports that he seems to have more motivation, and the Adderall has helped him avoid procrastination.  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  He continues to have some mild depressive symptoms, and some passive thoughts about suicide which he feels like are much easier to control and they are more fleeting.  He feels like he is better able to participate in learning in the PHP.  We discussed increasing Zoloft to 200 mg.  We agreed to start by increasing to 150 for the next week, then increase to 200 mg after that.  I sent the medications electronically for him.  I reviewed the common side effects of Zoloft, namely GI upset and diarrhea, and he noticed some loose stools when he started Zoloft recently, but this has started to resolve.    -Increase Zoloft to 150 mg x 1 week, then 200 mg -Continue BuSpar 10 mg twice daily -Continue Adderall 10 mg twice  daily -Ativan for acute panic -Continue participation in [facility]

## 2017-08-09 ENCOUNTER — Encounter (HOSPITAL_COMMUNITY): Payer: Self-pay

## 2017-08-09 ENCOUNTER — Other Ambulatory Visit (HOSPITAL_COMMUNITY): Payer: BC Managed Care – PPO | Admitting: Licensed Clinical Social Worker

## 2017-08-09 VITALS — BP 116/80 | HR 76 | Ht 72.0 in | Wt 257.0 lb

## 2017-08-09 DIAGNOSIS — F332 Major depressive disorder, recurrent severe without psychotic features: Secondary | ICD-10-CM | POA: Diagnosis not present

## 2017-08-09 DIAGNOSIS — F411 Generalized anxiety disorder: Secondary | ICD-10-CM

## 2017-08-09 NOTE — Progress Notes (Signed)
Met with patient today as he presented with flat affect, more level mood but still admitted to depression at a 7, anxiety at a 5, and hopelessness at a 0 on a scale of 0-10 with 0 being none and 10 the worst he could experience.  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  States he has taken his prescribed Ativan a few times over the past week to deal with periods of increased anxiety but admits he is trying to use this as little as possible and using breathing techniques, exercise and other coping skills to help manage anxiety attacks.  Patient reported he had already noted feeling some improvement with increased Zoloft to 100 mg, taking 1 and 1/2 for a week and then going up to 200 mg a day per Dr. Eksir's change on 08/08/17.  Assisted patient with [facility] to make sure the medication was being filled per new order as he had gotten a message it was too early to fill.  Pharmacist reported just need to back out previous refill that had not been picked up yet and would fill per new instructions received 08/08/17.  Patient to pick up medication today and denies any current suicidal or homicidal ideations and no other symptoms. Patient scored a 16 on his PHQ9 depression screening today, down from 20 he scored on 07/19/17.  Patient discussed plans to use coping skills and plans to workout on Sunday 08/11/17 to help keep anxiety about upcoming meeting for Monday with resignation plans down.  Patient to contact our office if any problems prior or if symptoms worsen.  Patient stable at this time with no admission he has only has a suicidal thought once over the past 2 weeks and is feeling some better and more in control.  Patient to contact PHP staff with any issues or need to follow up and will  return to the program on 08/13/17.  

## 2017-08-09 NOTE — Psych (Signed)
   [facility] PHP THERAPIST PROGRESS NOTE  Stephen Orozco [id number]  Session Time: 9 am - 2 pm  Participation Level: Active  Behavioral Response: CasualAlertAnxious and Depressed  Type of Therapy: Group Therapy; Psychotherapy, Psychoeducation, Activity Therapy, Occupational Therapy  Treatment Goals addressed: Coping  Interventions: CBT, DBT, Supportive and Reframing  Summary:  9:00 - 10:30 Clinician led check-in regarding current stressors and situation, and review of patient completed daily inventory. Clinician utilized active listening and empathetic response and validated patient emotions. Clinician facilitated processing group on pertinent issues.  10:30 -11:30: OT Group  11:30-12:15: Clinician led psychoeducation group on distress tolerance. The ACCEPTS skill was reviewed and finished, and patients discussed how to utilize it. Patients identified when this technique may be helpful in their personal lives.  12:15 - 1:00: Reflection group: Patients encouraged to practice skills and interpersonal techniques or work on mindfulness and relaxation techniques. The importance of self-care and making skills part of a routine to increase usage were stressed.  1:00 - 1:50 Clinician continued topic of "Distress Tolerance". Group discussed self-soothe and how/when patients can employ this method to help. Patients identified when this technique may be helpful in their personal lives.  1:50 - 2:00 Clinician led check-out. Clinician assessed for immediate needs, medication compliance and efficacy, and safety concerns.                                                                                                                                                     Suicidal/Homicidal: Nowithout intent/plan  Therapist Response: Stephen Orozco is a 46 y.o. male who presents with anxiety and depression symptoms. Patient arrived within time allowed and reports he "feels better than yesterday but  stressed." Patient rates his mood at a 5 in a 1-10 scale with 10 being great. Patient reports that he talked to his wife about setting a plan on what he is going to do next as far as his job is concerned. Patient plans on meeting with his principle on Monday to discuss next steps. Patient engaged in activity and discussion. Patient demonstrates some progress as evidenced by coming to the realization that his current job may not be the best fit for him.  This realization has helped reduce patient's anxiety about having to return to his current position.  Patient denies SI/HI/self-harm at the end of group.    Plan: Pt will continue in PHP while working on decreasing anxiety and depression symptoms, increase level of daily functioning, and increase ability to manage symptoms.   Diagnosis: Severe recurrent major depression without psychotic features ([place]) [F33.2]    1. Severe recurrent major depression without psychotic features ([name])   2. Generalized anxiety disorder     Royetta Crochet, LPCA 08/09/2017

## 2017-08-12 ENCOUNTER — Other Ambulatory Visit (HOSPITAL_COMMUNITY): Payer: Self-pay

## 2017-08-12 NOTE — Psych (Signed)
   [facility] PHP THERAPIST PROGRESS NOTE  [name] [id number]  Session Time: 9 am - 1 pm  Participation Level: Active  Behavioral Response: CasualAlertAnxious and Depressed  Type of Therapy: Group Therapy; Psychotherapy, Psychoeducation, Activity Therapy, Art Therapy  Treatment Goals addressed: Coping  Interventions: CBT, DBT, Supportive and Reframing  Summary:  9:00 - 10:30 Clinician led check-in regarding current stressors and situation, and review of patient completed daily inventory. Clinician utilized active listening and empathetic response and validated patient emotions. Clinician facilitated processing group on pertinent issues.  10:30 -12:00: Clinician led group on coping skills. Group played "Coping Skills Bingo" and identified coping skills that work for them and coping skills they may like to try. Clinician provided patients with a list of coping skills. Patients created "coping skills flashcards" to use in times of need.  12:00- 12:50 Clinician introduced topic of "Positive Psychology". Group watched "Positive Psychology" Ted-Talk. Patients discussed how their "lense" of life effects the way they feel. Patients identified two strategies they would be willing to try to change their "lense".  12:50 - 1:00 Clinician led check-out. Clinician assessed for immediate needs, medication compliance and efficacy, and safety concerns.                                                                                                                                                     Suicidal/Homicidal: Nowithout intent/plan  Therapist Response: Stephen Orozco is a 46 y.o. male who presents with anxiety and depression symptoms. Patient arrived within time allowed and reports he is feeling "better today." Patient rates his mood at a 6 on a scale of 1-10 with 10 being great. Patient reports that he had a rough night with his kids, as they kept acting out. 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 Patient engaged in activity and discussion. Patient demonstrates some progress as evidenced by sharing that he is going to utilize his coping skills flashcards this weekend whenever he starts to feel overwhelmed. Patient denies SI/HI/self-harm at the end of group.   Plan: Pt will continue in PHP while working on decreasing anxiety and depression symptoms, increase level of daily functioning, and increase ability to manage symptoms.   Diagnosis: Severe recurrent major depression without psychotic features ([place]) [F33.2]    1. Severe recurrent major depression without psychotic features ([place])   2. Generalized anxiety disorder     Royetta Crochet, LPCA 08/12/2017

## 2017-08-13 ENCOUNTER — Other Ambulatory Visit (HOSPITAL_COMMUNITY): Payer: BC Managed Care – PPO | Admitting: Occupational Therapy

## 2017-08-13 ENCOUNTER — Other Ambulatory Visit (HOSPITAL_COMMUNITY): Payer: BC Managed Care – PPO | Admitting: Licensed Clinical Social Worker

## 2017-08-13 DIAGNOSIS — F411 Generalized anxiety disorder: Secondary | ICD-10-CM

## 2017-08-13 DIAGNOSIS — F332 Major depressive disorder, recurrent severe without psychotic features: Secondary | ICD-10-CM

## 2017-08-13 NOTE — Therapy (Signed)
[place], [place], [address] Phone: [phone]   Fax:  [phone]  Occupational Therapy Treatment and Discharge  Patient Details  Name: Stephen Orozco MRN: [id number] Date of Birth: [birth year] Referring Provider: Dr. Donnelly Angelica  Encounter Date: 08/13/2017      OT End of Session - 08/13/17 1401    Visit Number 7   Number of Visits 7   Date for OT Re-Evaluation 08/16/17   Authorization Type [employer]   OT Start Time 1030   OT Stop Time 1130   OT Time Calculation (min) 60 min   Activity Tolerance Patient tolerated treatment well   Behavior During Therapy [facility] for tasks assessed/performed      Past Medical History:  Diagnosis Date  . Hyperlipidemia   . Hypertension     Past Surgical History:  Procedure Laterality Date  . APPENDECTOMY    . medial malleolar fixation Right    Ankle  . MENISCUS REPAIR Left 2010    There were no vitals filed for this visit.      Subjective Assessment - 08/13/17 1400    Currently in Pain? No/denies            [facility] OT Assessment - 08/13/17 1400      Assessment   Diagnosis Major Depressive Disorder     Precautions   Precautions None        OT Treatment Session: Job Readiness  S: "I'm going to be looking for something new."   O: Patient actively participated in the following skilled occupational therapy treatment session this date.  -Job Readiness: Pt educated on soft skills vs hard skills and importance of each to the job environment. Pt actively participated in discussion of causes of stress at work and strategies to promote a healthy working environment, as well as stress management strategies for the workplace. Also discussed interpersonal skills required for the job and how to effectively communicate with coworker and supervisors. Pt actively participated in assertiveness activity, writing out a scenario and an assertive response then  passing to another group member and replying with an assertive response. Discussed scenarios and responses at end of activity and brainstormed alternative ways to handle situations.   A: Patient participated in skilled occupational therapy group for job readiness skills this date.  Patient was engaged in group discussion and open to strategies introduced.  P: Continue participation in skilled occupational therapy groups  1-2 times per week for 2 weeks in order to gain the necessary skills needed to return to full time community living and learn effective coping strategies to be a productive community resident.                OT Short Term Goals - 08/13/17 1401      OT SHORT TERM GOAL #1   Title Patient will be educated on strategies to improve psychosocial skills needed to participate fully in all daily, work, and leisure activities.   Time 3   Period Weeks   Status Achieved     OT SHORT TERM GOAL #2   Title Patient will be educated on a HEP and independent with implementation of HEP.   Time 3   Period Weeks   Status Achieved     OT SHORT TERM GOAL #3   Title Patient will independently apply psychosocial skills and coping mechanisms to her daily activities in order to function independently.   Time 3   Period Weeks  Status Achieved                Patient will benefit from skilled therapeutic intervention in order to improve the following deficits and impairments:  Other (comment) (decreased coping skills, decreased psychosocial skills)  Visit Diagnosis: Severe recurrent major depression without psychotic features ([place])  Generalized anxiety disorder    Problem List Patient Active Problem List   Diagnosis Date Noted  . Tobacco abuse 11/20/2013  . Hyperlipidemia 11/20/2013  . Obesity, Class II, BMI 35-39.9, with comorbidity 04/22/2012  . Anxiety 02/26/2012  . Essential hypertension, benign 02/26/2012   Guadelupe Sabin, OTR/L  [phone] 08/13/2017,  2:01 PM  [place], [place], [address] Phone: [phone]   Fax:  [phone]  Name: Stephen Orozco MRN: [id number] Date of Birth: 1971-04-25    OCCUPATIONAL THERAPY DISCHARGE SUMMARY  Visits from Start of Care: 7  Current functional level related to goals / functional outcomes: Pt has actively participated in OT groups focusing on sleep hygiene, social and communication skills, physical and mental health and wellness, stress management, job readiness, and time management. Pt is engaged during session and demonstrates good problem-solving and brainstorming.    Remaining deficits: Pt continues to struggle with implementing coping skills at times. Appears to have good insight into deficits.    Education / Equipment: Pt educated on and provided with strategies for the above-mentioned areas.  Plan: Patient agrees to discharge.  Patient goals were met. Patient is being discharged due to meeting the stated rehab goals.  ?????

## 2017-08-14 ENCOUNTER — Other Ambulatory Visit (HOSPITAL_COMMUNITY): Payer: BC Managed Care – PPO | Admitting: Licensed Clinical Social Worker

## 2017-08-14 ENCOUNTER — Encounter (HOSPITAL_COMMUNITY): Payer: Self-pay

## 2017-08-14 VITALS — BP 126/84 | HR 91 | Ht 72.0 in | Wt 258.0 lb

## 2017-08-14 DIAGNOSIS — F332 Major depressive disorder, recurrent severe without psychotic features: Secondary | ICD-10-CM | POA: Diagnosis not present

## 2017-08-14 DIAGNOSIS — F331 Major depressive disorder, recurrent, moderate: Secondary | ICD-10-CM

## 2017-08-14 MED ORDER — AMPHETAMINE-DEXTROAMPHETAMINE 10 MG PO TABS: 10.0000 mg | ORAL_TABLET | Freq: Two times a day (BID) | ORAL | 0 refills | Status: DC

## 2017-08-14 MED ORDER — SERTRALINE HCL 100 MG PO TABS: 200.0000 mg | ORAL_TABLET | Freq: Every day | ORAL | 1 refills | Status: DC

## 2017-08-14 MED ORDER — BUSPIRONE HCL 10 MG PO TABS: 10.0000 mg | ORAL_TABLET | Freq: Two times a day (BID) | ORAL | 1 refills | Status: DC

## 2017-08-14 NOTE — Progress Notes (Signed)
140/31/2018 10:30-12:00.  Pt attended spirituality group facilitated by chaplain Jerene Pitch, [place] as a tool for reflection, mindfulness, meditation and prayer.   Labyrinth was introduced with some background for the practice.  Participants were given guidance around several ways to approach / use the labyrinth.  Group engaged in labyrinth walk.  Engaged in facilitated reflection and dialog around experience of Labyrinth.  Group facilitation drew on elements of client-centered, narrative, and psycho-dynamic group process.   Takumi was present throughout group.  Presented with appropriate / congruent affect and engaged in group activity and discussion voluntarily.   Legrand Como reflected on his experience with the labyrinth - stating that he stopped to find a moment of gratefulness at each turn on his way in.  He connected this with positivity exercise he had learned from another group session.  On way out of labyrinth, he reflected on each day of his hospitalization.  Described integrating this hospitalization experience and growth into his life going forward.  As Valor is a hiker, he related the labyrinth with some hikes that he as taken where he has been able to find new awareness.  He is hopeful to go on a hike with his son this weekend.    [facility] / [place], MDiv

## 2017-08-14 NOTE — Progress Notes (Signed)
Patient presented with appropriate affect, more level mood and reported feeling the increase in Zoloft was helpful and already noticeable.  Patient denied any suicidal or homicidal ideations and reported depression a 3, anxiety a 3 and hopelessness a 3 on a scale of 0-10 with 0 being none and 10 the worst he could manage.  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  Patient reported he hopeful Zoloft increase and present medications will continue to lessen episodes of panic attacks and overall anxiety.  Patient reported looking forward to this evening to spend time with his children and wife during Halloween as he admits they all enjoy this holiday.  Patient reported he went a few days without Adderall waiting on a prior authorization to go through and he and especially his wife could tell some difference in his focus and attention.  Patient reported taking Buspar and all other medications as ordered now and has decided to follow up with Dr. Daron Offer for medication management after ending PHP this date.  Sates he is going to decide about an individual therapist as he reported thinking this would e helpful long-term and declined need for stepdown to IOP services.  Patient stable this date and admits to being more optimistic.  Reports plan to work on things around his home this week and then to begin looking for a job he wants more in the coming weeks.  Patient agreed to call if any problems before next appointment scheduled with Dr. Daron Offer 11/05/17 and eturned to [facility] to finish last day of group without any other concerns noted.  Patient denied any problems or side effects to current medication regimen.

## 2017-08-14 NOTE — Progress Notes (Signed)
Discharge from [place] - 08/14/2017  Dates of participation: 07/22/2017 - 08/14/2017  Stephen Orozco reports that he has tolerated the increase in Zoloft to 150 mg well, and will increase to 200 mg next week as we discussed.  He reports that he feels much more optimistic, and feels better prepared to deal with challenges moving forward.  He reflected on some of the positive changes he has noted while he is participated in the [facility].  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  He is coping with this well, and reports that his wife has been very supportive.  He is planning for the future,  and intends to make a therapy appointment in the coming weeks.  He is pleased that he will be following up with this Probation officer for medication management.  Mental Status: 46 year old Caucasian male, well-nourished, cooperative and pleasant during our interaction.  He has good eye contact, speech is of normal rate and tone.  His mood is more positive today, and his affect is significantly improved in terms of range, more smiling.  His thought processes coherent and logical, and associations are intact.  He does not present any suicidality and has positive future planning.  No presentation of auditory or visual hallucinations, paranoia or psychotic symptoms.  He appears fully oriented with intact sensorium.  Normal causal gait.  Follow-up Care Plans: Appropriate for discharge from [place]  Follow-up with Dr. Daron Offer (writer) 11/05/17 for psychiatric medication management Follow-up with Dr. Deneise Lever for individual therapy  Refill on Buspar, Zoloft, Adderall for 3 months provided today Lorazepam refill sent, available for acute anxiety/panic 3-4 times per week

## 2017-08-15 NOTE — Psych (Signed)
   [facility] PHP THERAPIST PROGRESS NOTE  Stephen Orozco [id number]  Session Time: 9 am - 1:30 pm  Participation Level: Active  Behavioral Response: CasualAlertAnxious  Type of Therapy: Group Therapy; Psychotherapy, Psychoeducation, Activity Therapy, Spiritual Care  Treatment Goals addressed: Coping  Interventions: CBT, DBT, Supportive and Reframing  Summary:  9:00 - 10:30 Clinician led check-in regarding current stressors and situation, and review of patient completed daily inventory. Clinician utilized active listening and empathetic response and validated patient emotions. Clinician facilitated processing group on pertinent issues.  10:30 -12:00: Spiritual Care Group  12:00-12:30: Reflection group: Patients encouraged to practice skills and interpersonal techniques or work on mindfulness and relaxation techniques. The importance of self-care and making skills part of a routine to increase usage were stressed.  12:30 - 1:15 Clinician introduced topic of fear. Group watched "Why You Should Define Your Fears Instead of Your Goals" Ted-Talk. Patients discussed fear setting and how to utilize it as a skill.  1:15 - 1:30 Clinician led check-out. Clinician assessed for immediate needs, medication compliance and efficacy, and safety concerns.                                                                                                                           Suicidal/Homicidal: Nowithout intent/plan  Therapist Response: Stephen Orozco is a 46 y.o. male who presents with anxiety and depression symptoms. Patient arrived within time allowed and reports he is feeling "good." Patient rates his mood at a 7 on a scale of 1-10 scale with 10 being great. Patient reports that he had a good evening with his wife and kids. Patient stated that he cooked dinner and helped his kids with their homework. Patient shared that he has noticed a huge change in his behavior since starting group as far as his  concentration and energy goes. Patient engaged in activity and discussion. Patient demonstrates some progress as evidenced by setting goals for himself to achieve after he leaves group and shared all the coping skills he is going to continue to use. Patient denies SI/HI/self-harm at the end of group.   Plan: Patient will discharge from [facility] due to meeting treatment goals of decreased depression and anxiety symptoms and increased coping abilities. Progress was measured by observation, self-report, and scales. Psychiatrist has approved discharge and patient reports alignment with discharge plan. Patient will step down to outpatient therapy and psychiatry.Patient is scheduled within this agency for psychiatry. Psychiatry: Dr. Daron Offer 11/05/17; Patient was provided with information to schedule an appt with Felizardo Hoffmann in the [place] office. Pt reports he is waiting until his insurance switches over on 11/1 to call and schedule an appt. Patient denies any SI/HI at time of discharge.    Diagnosis: Major depressive disorder, recurrent episode, moderate ([facility]) [F33.1]    1. Major depressive disorder, recurrent episode, moderate ([place])    Stephen Orozco, LPCA 08/15/2017

## 2017-08-15 NOTE — Psych (Signed)
   [facility] PHP THERAPIST PROGRESS NOTE  Stephen Orozco [id number]  Session Time: 9 am - 2 pm  Participation Level: Active  Behavioral Response: CasualAlertAnxious  Type of Therapy: Group Therapy; Psychotherapy, Psychoeducation, Activity Therapy, Occupational Therapy  Treatment Goals addressed: Coping  Interventions: CBT, DBT, Supportive and Reframing  Summary:  9:00 - 10:30 Clinician led check-in regarding current stressors and situation, and review of patient completed daily inventory. Clinician utilized active listening and empathetic response and validated patient emotions. Clinician facilitated processing group on pertinent issues.  10:30 -11:30: OT Group  11:30 - 12:00: Clinician introduced topic of boundaries and discussed ways in which pt's can identify that a boundary is needed.  12:00 - 12:45: Reflection group: Patients encouraged to practice skills and interpersonal techniques or work on mindfulness and relaxation techniques. The importance of self-care and making skills part of a routine to increase usage were stressed.  12:45 - 1:50 Clinician continued topic of "Boundaries." Cln introduced topic of how to set and maintain boundaries. Patients discussed specific scenarios in life where boundaries need to be addressed and ways to address them.  1:50 - 2:00 Clinician led check-out. Clinician assessed for immediate needs, medication compliance and efficacy, and safety concerns.                                                                                                                                                     Suicidal/Homicidal: Nowithout intent/plan  Therapist Response: [name] is a 46 y.o. male who presents with anxiety and depression symptoms. Patient arrived within time allowed and reports he is feeling "relieved." Patient rates his mood at a 6 in a 1-10 scale with 10 being great. Patient reports he went into school to discuss yesterday and had a panic  attack but was able to manage it and do what he needed to do. Patient demonstrates some progress as evidenced by effectively managing his anxiety at school yesterday.  Patient denies SI/HI/self-harm at the end of group.    Plan: Pt will continue in PHP while working on decreasing anxiety and depression symptoms, increase level of daily functioning, and increase ability to manage symptoms.   Diagnosis: Severe recurrent major depression without psychotic features ([place]) [F33.2]    1. Severe recurrent major depression without psychotic features ([place])   2. Generalized anxiety disorder     Lorin Glass, LCSW 08/15/2017

## 2017-08-16 ENCOUNTER — Ambulatory Visit: Payer: BC Managed Care – PPO | Admitting: Family Medicine

## 2017-09-13 ENCOUNTER — Encounter (HOSPITAL_COMMUNITY): Payer: Self-pay | Admitting: Psychiatry

## 2017-09-16 ENCOUNTER — Other Ambulatory Visit (HOSPITAL_COMMUNITY): Payer: Self-pay | Admitting: Psychiatry

## 2017-09-16 MED ORDER — AMPHETAMINE-DEXTROAMPHETAMINE 10 MG PO TABS: 10.0000 mg | ORAL_TABLET | Freq: Two times a day (BID) | ORAL | 0 refills | Status: DC

## 2017-09-16 MED ORDER — BUSPIRONE HCL 10 MG PO TABS: 10.0000 mg | ORAL_TABLET | Freq: Two times a day (BID) | ORAL | 0 refills | Status: DC

## 2017-09-17 ENCOUNTER — Ambulatory Visit (HOSPITAL_COMMUNITY): Payer: Self-pay | Admitting: Licensed Clinical Social Worker

## 2017-10-02 ENCOUNTER — Other Ambulatory Visit: Payer: Self-pay | Admitting: *Deleted

## 2017-10-02 ENCOUNTER — Other Ambulatory Visit: Payer: Self-pay | Admitting: Family Medicine

## 2017-10-02 DIAGNOSIS — F411 Generalized anxiety disorder: Secondary | ICD-10-CM

## 2017-10-02 DIAGNOSIS — F419 Anxiety disorder, unspecified: Secondary | ICD-10-CM

## 2017-10-02 MED ORDER — SERTRALINE HCL 50 MG PO TABS: 50.0000 mg | ORAL_TABLET | Freq: Every day | ORAL | 1 refills | Status: DC

## 2017-10-02 NOTE — Telephone Encounter (Signed)
He sees Product manager. Are you ok filling these 2 meds? Please advise

## 2017-10-28 ENCOUNTER — Ambulatory Visit (INDEPENDENT_AMBULATORY_CARE_PROVIDER_SITE_OTHER): Payer: BC Managed Care – PPO | Admitting: Licensed Clinical Social Worker

## 2017-10-28 DIAGNOSIS — F411 Generalized anxiety disorder: Secondary | ICD-10-CM | POA: Diagnosis not present

## 2017-10-28 DIAGNOSIS — F331 Major depressive disorder, recurrent, moderate: Secondary | ICD-10-CM | POA: Diagnosis not present
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
## 2017-11-05 ENCOUNTER — Encounter (HOSPITAL_COMMUNITY): Payer: Self-pay | Admitting: Psychiatry

## 2017-11-05 ENCOUNTER — Ambulatory Visit (INDEPENDENT_AMBULATORY_CARE_PROVIDER_SITE_OTHER): Payer: BC Managed Care – PPO | Admitting: Psychiatry

## 2017-11-05 VITALS — BP 128/76 | HR 92 | Ht 72.0 in | Wt 256.0 lb

## 2017-11-05 DIAGNOSIS — R5382 Chronic fatigue, unspecified: Secondary | ICD-10-CM

## 2017-11-05 DIAGNOSIS — Z79899 Other long term (current) drug therapy: Secondary | ICD-10-CM | POA: Diagnosis not present

## 2017-11-05 DIAGNOSIS — G4730 Sleep apnea, unspecified: Secondary | ICD-10-CM

## 2017-11-05 DIAGNOSIS — F3341 Major depressive disorder, recurrent, in partial remission: Secondary | ICD-10-CM

## 2017-11-05 DIAGNOSIS — Z811 Family history of alcohol abuse and dependence: Secondary | ICD-10-CM | POA: Diagnosis not present

## 2017-11-05 DIAGNOSIS — Z87891 Personal history of nicotine dependence: Secondary | ICD-10-CM

## 2017-11-05 DIAGNOSIS — Z818 Family history of other mental and behavioral disorders: Secondary | ICD-10-CM

## 2017-11-05 MED ORDER — BUSPIRONE HCL 10 MG PO TABS: 10.0000 mg | ORAL_TABLET | Freq: Two times a day (BID) | ORAL | 0 refills | Status: DC

## 2017-11-05 MED ORDER — SERTRALINE HCL 100 MG PO TABS: 200.0000 mg | ORAL_TABLET | Freq: Every day | ORAL | 1 refills | Status: DC

## 2017-11-05 MED ORDER — AMPHETAMINE-DEXTROAMPHETAMINE 10 MG PO TABS
10.0000 mg | ORAL_TABLET | Freq: Two times a day (BID) | ORAL | 0 refills | Status: DC
Start: 2018-01-04 — End: 2018-05-01

## 2017-11-05 MED ORDER — AMPHETAMINE-DEXTROAMPHETAMINE 10 MG PO TABS: 10.0000 mg | ORAL_TABLET | Freq: Two times a day (BID) | ORAL | 0 refills | Status: DC

## 2017-11-05 NOTE — Patient Instructions (Signed)
Call [phone] to pick up the sleep kit for the home sleep test   

## 2017-11-05 NOTE — Progress Notes (Signed)
[facility] MD/PA/NP OP Progress Note  11/05/2017 9:00 AM Stephen Orozco  MRN:  [id number]  Chief Complaint: med check, PHP follow-up  HPI: Stephen Orozco reports that things have been fairly steady at home, with his mood, he is enjoying being able to be home more with his son.  He reports that he is doing some side work to make some extra income.  He and family spent a good holiday with his in-laws in [place].  He denies any suicidal thoughts or significant depressive symptoms.  Reports that overall his mood appears to be generally more steady and improving, with some ongoing fatigue and excessive daytime sleepiness.  He reports that he does snore and have some choking and coughing sensations at night, and has been told by his wife that he does stop breathing in his sleep periodically.  I suggested we proceed with a sleep study which has been denied for him in the past in the sleep clinic.  We agreed to proceed with a home sleep study, and assess for obstructive sleep apnea or central sleep apnea.  We agreed to continue Zoloft, BuSpar, Adderall as prescribed.  He does not use Ativan on any regular basis and has much of the bottle left over in case he has a panic attack.  We will follow-up in 10-12 weeks or sooner if needed.  Visit Diagnosis:    ICD-10-CM   1. Recurrent major depressive disorder, in partial remission (HCC) F33.41 sertraline (ZOLOFT) 100 MG tablet    busPIRone (BUSPAR) 10 MG tablet  2. 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 Chronic fatigue R53.82 sertraline (ZOLOFT) 100 MG tablet    Home sleep test    Past Psychiatric History: Psychiatric hospitalization at [facility], and participation in the [facility] program, previously saw this Probation officer in the context of PHP treatment  Past Medical History:  Past Medical History:  Diagnosis Date  . Hyperlipidemia   . Hypertension     Past Surgical History:  Procedure Laterality Date  . APPENDECTOMY     . medial malleolar fixation Right    Ankle  . MENISCUS REPAIR Left 2010    Family Psychiatric History: See intake H&P for full details. Reviewed, with no updates at this time.   Family History:  Family History  Problem Relation Age of Onset  . Alcohol abuse Father   . Diabetes Father   . Hyperlipidemia Father   . Hypertension Father   . Gout Father   . Bipolar disorder Father   . Cancer Unknown   . Heart attack Unknown   . Depression Unknown   . Autism spectrum disorder Son   . ADD / ADHD Son     Social History:  Social History   Socioeconomic History  . Marital status: Married    Spouse name: None  . Number of children: 2  . Years of education: None  . Highest education level: None  Social Needs  . Financial resource strain: None  . Food insecurity - worry: None  . Food insecurity - inability: None  . Transportation needs - medical: None  . Transportation needs - non-medical: None  Occupational History  . Occupation: unemployed.   Tobacco Use  . Smoking status: Former Smoker    Packs/day: 0.30    Years: 10.00    Pack years: 3.00    Types: Cigarettes    Last attempt to quit: 09/14/2012    Years since quitting: 5.1  . Smokeless  tobacco: Never Used  Substance and Sexual Activity  . Alcohol use: Yes    Alcohol/week: 1.0 - 1.5 oz    Types: 2 - 3 Standard drinks or equivalent per week    Comment: Occasional use  . Drug use: No  . Sexual activity: Yes    Partners: Female    Birth control/protection: None, Surgical  Other Topics Concern  . None  Social History Narrative   1 half caff drink daily. No regular exercise. Wife is a Marine scientist. He is unemployed right now.     Allergies:  Allergies  Allergen Reactions  . Adhesive [Tape] Rash    Metabolic Disorder Labs: Lab Results  Component Value Date   HGBA1C 5.6 02/15/2017   MPG 114 02/15/2017   No results found for: PROLACTIN Lab Results  Component Value Date   CHOL 208 (H) 02/15/2017   TRIG 172 (H)  02/15/2017   HDL 39 (L) 02/15/2017   CHOLHDL 5.3 (H) 02/15/2017   VLDL 26 12/16/2015   LDLCALC 126 12/16/2015   LDLCALC 126 (H) 12/10/2014   Lab Results  Component Value Date   TSH 1.57 02/15/2017    Therapeutic Level Labs: No results found for: LITHIUM No results found for: VALPROATE No components found for:  CBMZ  Current Medications: Current Outpatient Medications  Medication Sig Dispense Refill  . busPIRone (BUSPAR) 10 MG tablet Take 1 tablet (10 mg total) by mouth 2 (two) times daily. 180 tablet 0  . lisinopril (PRINIVIL,ZESTRIL) 10 MG tablet TAKE 1 TABLET BY MOUTH EVERY DAY 90 tablet 1  . LORazepam (ATIVAN) 0.5 MG tablet Take 1 tablet (0.5 mg total) by mouth every 8 (eight) hours as needed for anxiety. 60 tablet 0  . pravastatin (PRAVACHOL) 40 MG tablet TAKE 1 TABLET (40 MG TOTAL) BY MOUTH DAILY. 90 tablet 1  . sertraline (ZOLOFT) 100 MG tablet Take 2 tablets (200 mg total) by mouth daily. 180 tablet 1  . amphetamine-dextroamphetamine (ADDERALL) 10 MG tablet Take 1 tablet (10 mg total) by mouth 2 (two) times daily with a meal. 60 tablet 0  . [START ON 12/05/2017] amphetamine-dextroamphetamine (ADDERALL) 10 MG tablet Take 1 tablet (10 mg total) by mouth 2 (two) times daily with a meal. 60 tablet 0  . [START ON 01/04/2018] amphetamine-dextroamphetamine (ADDERALL) 10 MG tablet Take 1 tablet (10 mg total) by mouth 2 (two) times daily with a meal. 60 tablet 0   No current facility-administered medications for this visit.      Musculoskeletal: Strength & Muscle Tone: within normal limits Gait & Station: normal Patient leans: N/A  Psychiatric Specialty Exam: ROS  Blood pressure 128/76, pulse 92, height 6' (1.829 m), weight 256 lb (116.1 kg).Body mass index is 34.72 kg/m.  General Appearance: Casual and Well Groomed  Eye Contact:  Good  Speech:  Clear and Coherent  Volume:  Normal  Mood:  Euthymic  Affect:  Congruent  Thought Process:  Goal Directed and Descriptions of  Associations: Intact  Orientation:  Full (Time, Place, and Person)  Thought Content: Logical   Suicidal Thoughts:  No  Homicidal Thoughts:  No  Memory:  Immediate;   Fair  Judgement:  Good  Insight:  Good  Psychomotor Activity:  Normal  Concentration:  Concentration: Fair  Recall:  Good  Fund of Knowledge: Good  Language: Good  Akathisia:  Negative  Handed:  Right  AIMS (if indicated): not done  Assets:  Communication Skills Desire for Improvement Financial Resources/Insurance Housing Intimacy Leisure Time Physical Health Resilience  Social Support Heritage manager  ADL's:  Intact  Cognition: WNL  Sleep:  does not ever feel rested, snoring   Screenings: GAD-7     Counselor from 08/14/2017 in [place] Counselor from 07/23/2017 in [place] Office Visit from 12/16/2015 in [place]  Total GAD-7 Score  4  15  9     PHQ2-9     Counselor from 08/14/2017 in [place] Counselor from 08/09/2017 in [place] Counselor from 07/23/2017 in [place] Office Visit from 02/15/2017 in [place]  PHQ-2 Total Score  1  3  4  3   PHQ-9 Total Score  4  16  20  12        Assessment and Plan:  Stephen Orozco is a 47 year old male with a history of severe recurrent major depression, currently in partial remission.  I suspect his presentation and depressive symptoms are complicated by untreated sleep apnea.  He gives a history consistent with chronic excessive daytime sleepiness, and symptoms concerning for sleep disordered breathing with coughing and choking sensations at night.  We agreed to proceed with a home sleep study to investigate for sleep apnea, and then proceed to CPAP titration if  indicated.  With regard to his medication regimen, we will continue without any changes or modifications to the medications for now, as this does appear to be providing him consistent benefit.  No acute safety issues, and he has good support from his family and extended family.  If he has untreated sleep apnea, which I suspect, this is likely contributing to central obesity, depression, hypertension, and addressing this will likely improve his whole health.  1. Recurrent major depressive disorder, in partial remission ([place])   2. Sleep disorder breathing   3. Chronic fatigue     Status of current problems: stable  Labs Ordered: Orders Placed This Encounter  Procedures  . Home sleep test    Standing Status:   Future    Standing Expiration Date:   11/05/2018    Order Specific Question:   Where should this test be performed:    Answer:   [place] Reviewed: n/a  Collateral Obtained/Records Reviewed: n/a  Plan:  Continue Zoloft 200 mg daily Continue Adderall 10 mg twice a day, 3 months prescribed Continue Ativan tablet on rare occasion for acute episodes of panic Continue BuSpar 10 mg twice a day for generalized anxiety Return to clinic in 10-12 weeks Home sleep study ordered  I spent 30 minutes with the patient in direct face-to-face clinical care.  Greater than 50% of this time was spent in counseling and coordination of care with the patient.    Aundra Dubin, MD 11/05/2017, 9:00 AM

## 2017-11-08 ENCOUNTER — Encounter (HOSPITAL_COMMUNITY): Payer: Self-pay | Admitting: Psychiatry

## 2017-11-19 ENCOUNTER — Ambulatory Visit (INDEPENDENT_AMBULATORY_CARE_PROVIDER_SITE_OTHER): Payer: BC Managed Care – PPO | Admitting: Licensed Clinical Social Worker

## 2017-11-19 DIAGNOSIS — F331 Major depressive disorder, recurrent, moderate: Secondary | ICD-10-CM

## 2017-11-19 DIAGNOSIS — F411 Generalized anxiety disorder: Secondary | ICD-10-CM

## 2017-11-19 NOTE — Progress Notes (Signed)
   THERAPIST PROGRESS NOTE  Session Time: 2 PM to 2:55 PM  Participation Level: Active  Behavioral Response: CasualAlertEuthymic  Type of Therapy: Individual Therapy  Treatment Goals addressed:  coping skills, mood regulation skills, decrease in depression and anxiety  Interventions: CBT, Solution Focused, Strength-based, Supportive and Other: Stress management, mood management skills  Summary: Stephen Orozco is a 47 y.o. male who presents with update of symptoms and shared that Doctor Daron Offer has recommended sleep study. Patient hopeful that it will help with energy and depression. Discussed that work was a major stressor that precipitated severe symptoms. Shares that he still gets depressed, describes symptoms prior to hospitalization that it had never had gotten that bad, described that it "crippled"him to the point where he couldn't leave the room, couldn't leave the bed. Shares panic attack when he tried to return keys to school, couldn't breath, sweating, dizzy. Describes in past having infrequent nightmares where wake up were continued to have racing thoughts, sweats.Describes that depression became crippling because of anxiety. Shares that  social anxiety much better now, still has fatigue, always had depression. Yesterday not motivated to get moving, wanted to lay there, finally fed up and cleaned the house at night. Relates that he felt good about doing that task. Therapist related that motivation has a way of building on itself once he gets started it will give him more motivation. Patient shares that bothers him he hasn't gotten any call back for work. Shared progress in partial program where at the end he had a good outlook, feels medications have been very helpful in decreasing symptoms. Anxiety is a lot better. Explored career interest and relates great dad, husband, wants to be involved with people, enjoys helping people. 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 Discussed working on making new patterns of thoughts, patient relates working on following application that has directed him to identify three things he is grateful for.    Suicidal/Homicidal: No  Therapist Response: Assess patient current functioning per report and noted significant improvement in anxiety, improvement in depression but will be focus in treatment. Work with patient on problem solving to manage stressors which currently related to looking for work. Reviewed effective coping strategy patient has use and discussed cognitive strategies to replace negative thoughts with positive and provided education and explaining that we can develop skills to better manage thoughts and feelings to create more positive feelings. Discussed as well as addressing body that directly impacts brain that helps decrease of mood symptoms. Reinforce patient working on recommendations by Tawanna Cooler from [place] talk of engaging in activities such as being grateful and exercise to increase positive emotions. Discussed motivational strategies to include taking steps to engage in activity helps to build motivation. Utilize patient's recent experience to reinforce this practice. Discussed sleep study as possibly having very helpful impact on mood and energy. Provided supportive and strength-based intervention.  Plan: Return again in 2-3 weeks.2. Therapist work with patient on mood management and coping skills  Diagnosis: Axis I:  major depressive disorder, recurrent, moderate, generalized anxiety disorder    Axis II: No diagnosis    Cordella Register, LCSW 11/19/2017

## 2017-11-29 ENCOUNTER — Ambulatory Visit (HOSPITAL_BASED_OUTPATIENT_CLINIC_OR_DEPARTMENT_OTHER): Payer: BC Managed Care – PPO | Attending: Psychiatry | Admitting: Internal Medicine

## 2017-11-29 VITALS — Ht 72.0 in | Wt 249.0 lb

## 2017-11-29 DIAGNOSIS — G4733 Obstructive sleep apnea (adult) (pediatric): Secondary | ICD-10-CM | POA: Diagnosis not present

## 2017-11-29 DIAGNOSIS — R5382 Chronic fatigue, unspecified: Secondary | ICD-10-CM | POA: Diagnosis present

## 2017-11-29 DIAGNOSIS — G4730 Sleep apnea, unspecified: Secondary | ICD-10-CM | POA: Diagnosis present

## 2017-12-03 ENCOUNTER — Ambulatory Visit (HOSPITAL_COMMUNITY): Payer: Self-pay | Admitting: Licensed Clinical Social Worker

## 2017-12-13 DIAGNOSIS — G4733 Obstructive sleep apnea (adult) (pediatric): Secondary | ICD-10-CM

## 2017-12-13 NOTE — Procedures (Signed)
    Patient Name: Stephen Orozco, [place] Date: 11/29/2017 Gender: Male D.O.B: 10/15/1971 Age (years): 47 Referring Provider: Lulu Riding Height (inches): 47 Interpreting Physician: Baird Lyons MD, ABSM Weight (lbs): 246 RPSGT: Jacolyn Reedy BMI: 33 MRN: [id number] Neck Size: 18.00 <br> <br> CLINICAL INFORMATION Sleep Study Type: HST Indication for sleep study: Fatigue  Epworth Sleepiness Score: 6  SLEEP STUDY TECHNIQUE A multi-channel overnight portable sleep study was performed. The channels recorded were: nasal airflow, thoracic respiratory movement, and oxygen saturation with a pulse oximetry. Snoring was also monitored.  MEDICATIONS Patient self administered medications include: none reported.  SLEEP ARCHITECTURE Patient was studied for 394.8 minutes. The sleep efficiency was 97.7 % and the patient was supine for 99.9%. The arousal index was 0.0 per hour.  RESPIRATORY PARAMETERS The overall AHI was 53.3 per hour, with a central apnea index of 0.0 per hour.  The oxygen nadir was 77% during sleep.  CARDIAC DATA Mean heart rate during sleep was 73.2 bpm.  IMPRESSIONS - Severe obstructive sleep apnea occurred during this study (AHI = 53.3/h). - No significant central sleep apnea occurred during this study (CAI = 0.0/h). - Severe oxygen desaturation was noted during this study (Min O2 = 77%). - Patient snored.  DIAGNOSIS - Obstructive Sleep Apnea (327.23 [G47.33 ICD-10])  RECOMMENDATIONS - Recommend CPAP titration study or AutoPap. Other options would be based on clinical judgment. - Be careful with alcohol, sedatives and other CNS depressants that may worsen sleep apnea and disrupt normal sleep architecture. - Sleep hygiene should be reviewed to assess factors that may improve sleep quality. - Weight management and regular exercise should be initiated or continued.  [Electronically signed] 12/13/2017 07:23 PM  Baird Lyons MD, [place],  American Board of Sleep Medicine   NPI: [id number]                         [place], [place] of Sleep Medicine  ELECTRONICALLY SIGNED ON:  12/13/2017, 7:21 PM [place] PH: [phone]   FX: [phone] [place]

## 2017-12-14 ENCOUNTER — Other Ambulatory Visit (HOSPITAL_COMMUNITY): Payer: Self-pay | Admitting: Psychiatry

## 2017-12-14 DIAGNOSIS — G4733 Obstructive sleep apnea (adult) (pediatric): Secondary | ICD-10-CM

## 2017-12-14 NOTE — Progress Notes (Signed)
Patient with severe OSA, I have placed order for CPAP titration. Patient aware. Wanted to keep you in the loop regarding this change.

## 2018-01-01 ENCOUNTER — Ambulatory Visit (INDEPENDENT_AMBULATORY_CARE_PROVIDER_SITE_OTHER): Payer: BC Managed Care – PPO | Admitting: Licensed Clinical Social Worker

## 2018-01-01 DIAGNOSIS — F331 Major depressive disorder, recurrent, moderate: Secondary | ICD-10-CM | POA: Diagnosis not present

## 2018-01-01 DIAGNOSIS — F411 Generalized anxiety disorder: Secondary | ICD-10-CM | POA: Diagnosis not present

## 2018-01-01 NOTE — Progress Notes (Signed)
   THERAPIST PROGRESS NOTE  Session Time: 1:05 PM to 2 PM  Participation Level: Active  Behavioral Response: CasualAlertEuthymic  Type of Therapy: Individual Therapy  Treatment Goals addressed: reduce overall level, frequency and intensity of anxiety so that daily functioning is not impaired, coping, decrease symptoms of depression  Interventions: Solution Focused, Strength-based, Supportive and Reframing  Summary: Stephen Orozco is a 47 y.o. male who presents with major depressive disorder, recurrent, moderate, generalized anxiety disorder   Suicidal/Homicidal: No  Therapist Response: Past two weeks have been tired, did home sleep study and found out that he stopped breathing 51 times out of 60 minutes. Describes lack of energy to the point of not able to exercise. Scheduled for appointment for sleep study and discussed with therapist how this could significantly impact mood and energy. Therapist reviewed with patient his work on reshaping thoughts and patient shared lately difficult. Describes negative impact caused by not working. Shares that he does not feel he made the right choices when younger, focused on other priorities and describes difficulties of having jobs where employers made it difficult to advance even though patient Scientist, research (physical sciences). Therapist discuss how life experiences help [name] to make up for earlier decisions as well as recognizing one is not defined by one experience in life but that we are accumulation ofdifferent experiences and achievements we have had in her life. Patient completed treatment plan. Shared more about his children including challenges and joys of being a parent.  Assess patient current functioning per report. Therapist discussed current stressors and coping with stressors. Help patient to process feelings as discussing and labeling helps with coping. Explored sources of anxiety and depression includes possibly sleep related issues and situational and looking  for job.Utilize reframing to help patient in restructuring how he feels about past explaining that life offers lots of chances to make up for long choices in the past. Work with patient on current job seeking to normalize his experiences of looking for a job.Encouraged patient to realize strengths are not diminished and he is not defined by current situations as he carries with him in innate qualities as well as success from past experiences that make up part of who he has. Provided strength based and supportive interventions. Completed treatment plan. Plan: Return again in 3-4 weeks.2. Therapist work with patient on mood management and coping skills  Diagnosis: Axis I:   major depressive disorder, recurrent, moderate, generalized anxiety disorder    Axis II: No diagnosis    Cordella Register, LCSW 01/01/2018

## 2018-01-03 ENCOUNTER — Ambulatory Visit (HOSPITAL_BASED_OUTPATIENT_CLINIC_OR_DEPARTMENT_OTHER): Payer: BC Managed Care – PPO | Attending: Psychiatry | Admitting: Internal Medicine

## 2018-01-03 DIAGNOSIS — G4733 Obstructive sleep apnea (adult) (pediatric): Secondary | ICD-10-CM | POA: Diagnosis present

## 2018-01-11 DIAGNOSIS — G4733 Obstructive sleep apnea (adult) (pediatric): Secondary | ICD-10-CM

## 2018-01-11 NOTE — Procedures (Signed)
   Patient Name: Stephen Orozco, Para Date: 01/03/2018 Gender: Male D.O.B: [birth year] Age (years): 47 Referring Provider: Lulu Riding Height (inches): 70 Interpreting Physician: Baird Lyons MD, ABSM Weight (lbs): 254 RPSGT: Baxter Flattery BMI: 36 MRN: [id number] Neck Size: 18.00  CLINICAL INFORMATION The patient is referred for a CPAP titration to treat sleep apnea.  Date of NPSG, Split Night or HST:  11/29/17 NPSG  AHI 53.3/ hr, desaturation to 77%, body weight 246 lbs  SLEEP STUDY TECHNIQUE As per the AASM Manual for the Scoring of Sleep and Associated Events v2.3 (April 2016) with a hypopnea requiring 4% desaturations.  The channels recorded and monitored were frontal, central and occipital EEG, electrooculogram (EOG), submentalis EMG (chin), nasal and oral airflow, thoracic and abdominal wall motion, anterior tibialis EMG, snore microphone, electrocardiogram, and pulse oximetry. Continuous positive airway pressure (CPAP) was initiated at the beginning of the study and titrated to treat sleep-disordered breathing.  MEDICATIONS Medications self-administered by patient taken the night of the study : none reported  TECHNICIAN COMMENTS Comments added by technician: Patient had difficulty initiating sleep. SIMPLUS FULL FACE MASK WAS USED FOR THIS STUDY Comments added by scorer: N/A  RESPIRATORY PARAMETERS Optimal PAP Pressure (cm): 12 AHI at Optimal Pressure (/hr): 0.0 Overall Minimal O2 (%): 84.0 Supine % at Optimal Pressure (%): 100 Minimal O2 at Optimal Pressure (%): 94.0   SLEEP ARCHITECTURE The study was initiated at 10:55:25 PM and ended at 4:56:08 AM.  Sleep onset time was 25.8 minutes and the sleep efficiency was 84.7%%. The total sleep time was 305.4 minutes.  The patient spent 18.6%% of the night in stage N1 sleep, 81.4%% in stage N2 sleep, 0.0%% in stage N3 and 0.00% in REM.Stage REM latency was N/A minutes  Wake after sleep onset was 29.5. Alpha intrusion  was absent. Supine sleep was 94.27%.  CARDIAC DATA The 2 lead EKG demonstrated sinus rhythm. The mean heart rate was 74.8 beats per minute. Other EKG findings include: None.  LEG MOVEMENT DATA The total Periodic Limb Movements of Sleep (PLMS) were 0. The PLMS index was 0.0. A PLMS index of <15 is considered normal in adults.  IMPRESSIONS - The optimal PAP pressure was 12 cm of water. - Central sleep apnea was not noted during this titration (CAI = 0.0/h). - Moderate oxygen desaturations were observed during this titration (min O2 = 84.0%). Minimum saturation at CPAP 12 was 94.0%. - No snoring was audible during this study. - No cardiac abnormalities were observed during this study. - Clinically significant periodic limb movements were not noted during this study. Arousals associated with PLMs were rare.  DIAGNOSIS - Obstructive Sleep Apnea (327.23 [G47.33 ICD-10])  RECOMMENDATIONS - Trial of CPAP therapy on 12 cm H2O with a Large size Fisher&Paykel Full Face Mask Simplus mask and heated humidification. - Sleep hygiene should be reviewed to assess factors that may improve sleep quality. - Weight management and regular exercise should be initiated or continued.   [Electronically signed] 01/11/2018 03:32 PM  Baird Lyons MD, [place], American Board of Sleep Medicine   NPI: [id number]   [place], [place] of Sleep Medicine  ELECTRONICALLY SIGNED ON:  01/11/2018, 3:30 PM [place] PH: [phone]   FX: [phone] [place]

## 2018-01-13 NOTE — Progress Notes (Signed)
Hey there, sleep clinic should send over cpap order for this guy at some point, so lets be on  the lookout for it. Thank you!

## 2018-01-15 ENCOUNTER — Encounter (HOSPITAL_COMMUNITY): Payer: Self-pay | Admitting: Psychiatry

## 2018-01-15 NOTE — Progress Notes (Signed)
Patient ID: Stephen Orozco, male   DOB: 1971/02/26, 47 y.o.   MRN: [id number]  CPAP titration results received.  Faxed advanced home care order today 01/15/18.

## 2018-01-29 ENCOUNTER — Ambulatory Visit (INDEPENDENT_AMBULATORY_CARE_PROVIDER_SITE_OTHER): Payer: BC Managed Care – PPO

## 2018-01-29 ENCOUNTER — Encounter: Payer: Self-pay | Admitting: Family Medicine

## 2018-01-29 ENCOUNTER — Ambulatory Visit (INDEPENDENT_AMBULATORY_CARE_PROVIDER_SITE_OTHER): Payer: BC Managed Care – PPO | Admitting: Family Medicine

## 2018-01-29 VITALS — BP 124/81 | HR 80 | Ht 72.01 in | Wt 254.0 lb

## 2018-01-29 DIAGNOSIS — G8929 Other chronic pain: Secondary | ICD-10-CM

## 2018-01-29 DIAGNOSIS — G4733 Obstructive sleep apnea (adult) (pediatric): Secondary | ICD-10-CM

## 2018-01-29 DIAGNOSIS — M545 Low back pain: Secondary | ICD-10-CM

## 2018-01-29 MED ORDER — CYCLOBENZAPRINE HCL 10 MG PO TABS: 10.0000 mg | ORAL_TABLET | Freq: Three times a day (TID) | ORAL | 0 refills | Status: DC | PRN

## 2018-01-29 MED ORDER — AMBULATORY NON FORMULARY MEDICATION: 0 refills | Status: DC

## 2018-01-29 NOTE — Progress Notes (Signed)
Subjective:    Patient ID: Stephen Orozco, male    DOB: 1971-03-31, 47 y.o.   MRN: [id number]  HPI 2-year male comes in today complaining of back pain low mid and bilateral.  Trying heating pad and Advil.  Hurt it about a year ago when he was leaning a back against the edge of the porch and actually fell asleep.  When he woke up he says he was barely able to walk.  He did see a provider at that time but it eventually got a little bit better.  Since then though he feels like it is getting worse and that it is flaring more often.  Often he wakes up in the morning and it feels stiff and tight and sore.  By the time he sits down and has a cup of coffee and then pulls his knees up to stretch it actually eases off and it feels better throughout the day.  But then yesterday he bent over to grab his phone and had a sudden pain in his low back and actually fell forward.    No prior x-rays on record.  He does have some persistent pain just below the left hip on the upper, outer thigh.  But it does not connect directly to the back pain and almost like it skips.  He also had a titration sleep study performed on 01/03/2018.  Recommendation was to set his pressure at 12 cm of water pressure and they recommended a full mask with humidifier.  It is unclear if his psychiatrist is actually ordering supplies and getting this scheduled for him so we will go ahead and move forward and get that done.  As it has been a couple of weeks since his sleep study.  Review of Systems  BP 124/81   Pulse 80   Ht 6' 0.01" (1.829 m)   Wt 254 lb (115.2 kg)   SpO2 100%   BMI 34.44 kg/m     Allergies  Allergen Reactions  . Adhesive [Tape] Rash    Past Medical History:  Diagnosis Date  . Hyperlipidemia   . Hypertension     Past Surgical History:  Procedure Laterality Date  . APPENDECTOMY    . medial malleolar fixation Right    Ankle  . MENISCUS REPAIR Left 2010    Social History   Socioeconomic History  .  Marital status: Married    Spouse name: Not on file  . Number of children: 2  . Years of education: Not on file  . Highest education level: Not on file  Occupational History  . Occupation: unemployed.   Social Needs  . Financial resource strain: Not on file  . Food insecurity:    Worry: Not on file    Inability: Not on file  . Transportation needs:    Medical: Not on file    Non-medical: Not on file  Tobacco Use  . Smoking status: Former Smoker    Packs/day: 0.30    Years: 10.00    Pack years: 3.00    Types: Cigarettes    Last attempt to quit: 09/14/2012    Years since quitting: 5.3  . Smokeless tobacco: Never Used  Substance and Sexual Activity  . Alcohol use: Yes    Alcohol/week: 1.0 - 1.5 oz    Types: 2 - 3 Standard drinks or equivalent per week    Comment: Occasional use  . Drug use: No  . Sexual activity: Yes    Partners: Female  Birth control/protection: None, Surgical  Lifestyle  . Physical activity:    Days per week: Not on file    Minutes per session: Not on file  . Stress: Not on file  Relationships  . Social connections:    Talks on phone: Not on file    Gets together: Not on file    Attends religious service: Not on file    Active member of club or organization: Not on file    Attends meetings of clubs or organizations: Not on file    Relationship status: Not on file  . Intimate partner violence:    Fear of current or ex partner: Not on file    Emotionally abused: Not on file    Physically abused: Not on file    Forced sexual activity: Not on file  Other Topics Concern  . Not on file  Social History Narrative   1 half caff drink daily. No regular exercise. Wife is a Marine scientist. He is unemployed right now.     Family History  Problem Relation Age of Onset  . Alcohol abuse Father   . Diabetes Father   . Hyperlipidemia Father   . Hypertension Father   . Gout Father   . Bipolar disorder Father   . Cancer Unknown   . Heart attack Unknown   .  Depression Unknown   . Autism spectrum disorder Son   . ADD / ADHD Son     Outpatient Encounter Medications as of 01/29/2018  Medication Sig  . AMBULATORY NON FORMULARY MEDICATION Medication Name: *New start CPAP for OSA.  SEt to 12 cm water pressure. Large size Fisher&Paykel Full Face Mask Simplus mask and heated humidification. Please fax download after one week.  Fax to [facility].  Marland Kitchen amphetamine-dextroamphetamine (ADDERALL) 10 MG tablet Take 1 tablet (10 mg total) by mouth 2 (two) times daily with a meal.  . amphetamine-dextroamphetamine (ADDERALL) 10 MG tablet Take 1 tablet (10 mg total) by mouth 2 (two) times daily with a meal.  . amphetamine-dextroamphetamine (ADDERALL) 10 MG tablet Take 1 tablet (10 mg total) by mouth 2 (two) times daily with a meal.  . busPIRone (BUSPAR) 10 MG tablet Take 1 tablet (10 mg total) by mouth 2 (two) times daily.  . cyclobenzaprine (FLEXERIL) 10 MG tablet Take 1 tablet (10 mg total) by mouth 3 (three) times daily as needed for muscle spasms.  Marland Kitchen lisinopril (PRINIVIL,ZESTRIL) 10 MG tablet TAKE 1 TABLET BY MOUTH EVERY DAY  . LORazepam (ATIVAN) 0.5 MG tablet Take 1 tablet (0.5 mg total) by mouth every 8 (eight) hours as needed for anxiety.  . pravastatin (PRAVACHOL) 40 MG tablet TAKE 1 TABLET (40 MG TOTAL) BY MOUTH DAILY.  Marland Kitchen sertraline (ZOLOFT) 100 MG tablet Take 2 tablets (200 mg total) by mouth daily.   No facility-administered encounter medications on file as of 01/29/2018.          Objective:   Physical Exam  Constitutional: He is oriented to person, place, and time. He appears well-developed and well-nourished.  HENT:  Head: Normocephalic and atraumatic.  Cardiovascular: Normal rate, regular rhythm and normal heart sounds.  Pulmonary/Chest: Effort normal and breath sounds normal.  Musculoskeletal:  Lumbar flexion, extension, rotation right and left, and side bending.  Negative straight leg raise bilaterally but.  Hip, knee, ankle strength is 5 out of  5.  Patellar reflexes 1+ bilaterally  Neurological: He is alert and oriented to person, place, and time.  Skin: Skin is warm and dry.  Psychiatric:  He has a normal mood and affect. His behavior is normal.          Assessment & Plan:  Chronic intermittant low back pain -suspect that he may have some arthritis and may be some degenerative disc disease.  We will go ahead and get x-ray for further evaluation since pain is been persistent intermittently for about a year.  Also given prescription for muscle relaxer to use as needed.  If it is too sedating then discontinue it.  Obstructive sleep apnea-we will go ahead and order CPAP and supplies.  Hopefully he can get bill this delivered in the next week.  Did encourage him to try to wear the CPAP for as many hours as he can each night until he gets used to it.  He will need to be seen back in about 1 month after he gets the machine delivered.

## 2018-02-04 ENCOUNTER — Ambulatory Visit (INDEPENDENT_AMBULATORY_CARE_PROVIDER_SITE_OTHER): Payer: BC Managed Care – PPO | Admitting: Licensed Clinical Social Worker

## 2018-02-04 ENCOUNTER — Encounter: Payer: Self-pay | Admitting: Family Medicine

## 2018-02-04 DIAGNOSIS — F331 Major depressive disorder, recurrent, moderate: Secondary | ICD-10-CM

## 2018-02-04 DIAGNOSIS — F411 Generalized anxiety disorder: Secondary | ICD-10-CM

## 2018-02-04 NOTE — Progress Notes (Signed)
THERAPIST PROGRESS NOTE  Session Time: 2:02 PM-2:57 PM  Participation Level: Active  Behavioral Response: CasualAlertEuthymic  Type of Therapy: Individual Therapy  Treatment Goals addressed:  reduce overall level, frequency and intensity of anxiety so that daily functioning is not impaired, coping, decrease symptoms of depression  Interventions: CBT, Solution Focused, Strength-based, Supportive and Reframing  Summary: Dontre Laduca is a 47 y.o. male who presents with major depressive disorder, recurrent, moderate, generalized anxiety disorder.   Suicidal/Homicidal: No  Therapist Response: Patient shared that he found out that he has a couple of herniated discs and slipped discs, provided history that he has had for awhile and this was just discovered. Shares he is exhausted because no sleep, wakes up and can't get back to sleep, stress plays a factor, sleep apnea, is on a schedule where he has to get up, and weekend only a little better. Shares that he is more and more "washed out", takes all his energy to do his work, as exhausted as when he went into the hospital and the only difference is that he does not have the stress of going to work. Relates that he doesn't have have the energy to do activities outside the home that may help with mood. Shares that he has "a lot of depression". Takes the anxiety med twice a day, rarely takes the med prescribed if he needs it in the moment, no anxiety attacks, has anxiety but not the same thing. Shared that he puts applications in but doesn't hear back from them. He is looking at working with older people, relates concerns of medical issues piling up and financial concerns that go with it. Discussed his central role at home, takes care of taking care of the house, they save on day care and therapist reinforced patient making sure to recognize the value of this role. Patient shared that they have a good support system in [place]. Shares that he has  bad days, he then needs space and refocuses his attention onto an activity. Wife has down days, worried about fiances and patient has helped her in ways to handle it, learned for the hospital that he doesn't have to continue to stay in negative thought process, can refocus so doesn't say focused on negative. Relates not to focus on something could happen because they are not there and if so they will figure out a way to handle it. Shares learning that they way you word something in your head is significant in how perceive and feel about it.  Assessed patient current functioning per report. Discussed stressors, help patient to process feelings around stressors and discussed coping. Therapist validated patient on how he was feeling and provided supportive interventions for patient. Discussed aim of outpatient is to prevent episodes of depression so focus is on regulating his thoughts moods and activities to manage symptoms. Discussed how thoughts and beliefs plain important role in the way one responds to situations and people. Discussed how we have a negative inner critical voice at times that drives our thoughts, that the inner critic isn't the problem but is what we choose to do with these critical thoughts that matter, that we need to reframe the thoughts in a positive way. Provided positive feedback for patient's implementing this coping strategy for thoughts and emotions he learned in hospital, reframing negative thoughts to refocus on positive thinking so he does not stay in negative thinking pattern and spiral downward and also that he looks to refocus attention to help him out of  negative thought patterns. Discussed patient's significant role he plays in family of taking care of kids to help in not devaluating as he struggles with stressors of looking for work. Provided supportive and strength-based interventions.  Plan: Return again in 4 weeks.2.therapist continued to work with patient on insight and  application of coping strategies for depression and anxiety  Diagnosis: Axis I:   major depressive disorder, recurrent, moderate, generalized anxiety disorder    Axis II: No diagnosis    Cordella Register, LCSW 02/04/2018

## 2018-02-05 ENCOUNTER — Encounter: Payer: Self-pay | Admitting: Osteopathic Medicine

## 2018-02-05 ENCOUNTER — Ambulatory Visit (INDEPENDENT_AMBULATORY_CARE_PROVIDER_SITE_OTHER): Payer: BC Managed Care – PPO | Admitting: Osteopathic Medicine

## 2018-02-05 VITALS — BP 133/74 | HR 85 | Temp 98.6°F | Wt 253.7 lb

## 2018-02-05 DIAGNOSIS — J020 Streptococcal pharyngitis: Secondary | ICD-10-CM

## 2018-02-05 MED ORDER — LIDOCAINE VISCOUS HCL 2 % MT SOLN: 5.0000 mL | OROMUCOSAL | 1 refills | Status: DC | PRN

## 2018-02-05 MED ORDER — AMOXICILLIN-POT CLAVULANATE 875-125 MG PO TABS: 1.0000 | ORAL_TABLET | Freq: Two times a day (BID) | ORAL | 0 refills | Status: AC

## 2018-02-05 NOTE — Patient Instructions (Signed)
Over-the-Counter Medications & Home Remedies for Upper Respiratory Illness  Note: the following list assumes no pregnancy, normal liver & kidney function and no other drug interactions. Dr. Sheppard Coil has highlighted medications which are safe for you to use, but these may not be appropriate for everyone. Always ask a pharmacist or qualified medical provider if you have any questions!   Aches/Pains, Fever, Headache Acetaminophen (Tylenol) 500 mg tablets - take max 2 tablets (1000 mg) every 6 hours (4 times per day)  Ibuprofen (Motrin) 200 mg tablets - take max 4 tablets (800 mg) every 6 hours*  Sinus Congestion Nasal Saline if desired Oxymetolazone (Afrin, others) sparing use due to rebound congestion, NEVER use in kids Phenylephrine (Sudafed) 10 mg tablets every 4 hours (or the 12-hour formulation)* Diphenhydramine (Benadryl) 25 mg tablets - take max 2 tablets every 4 hours  Cough & Sore Throat Dextromethorphan (Robitussin, others) - cough suppressant Guaifenesin (Robitussin, Mucinex, others) - expectorant (helps cough up mucus) (Dextromethorphan and Guaifenesin also come in a combination tablet) Lozenges w/ Benzocaine + Menthol (Cepacol) Honey - as much as you want! Teas which "coat the throat" - look for ingredients Elm Bark, Licorice Root, Marshmallow Root  Other Antibiotics if these are prescribed - take ALL, even if you're feeling better  Zinc Lozenges within 24 hours of symptoms onset - mixed evidence this shortens the duration of the common cold Don't waste your money on Vitamin C or Echinacea  *Caution in patients with high blood pressure

## 2018-02-05 NOTE — Progress Notes (Signed)
HPI: Stephen Orozco is a 47 y.o. male who  has a past medical history of Hyperlipidemia and Hypertension.  he presents to [facility] today, 02/05/18,  for chief complaint of: Sore throat, concerned for strep   Sore throat w/ mild to no cough, (+)fever up to 100.5, malaise, tender cervical LN.    Past medical, surgical, social and family history reviewed:  Patient Active Problem List   Diagnosis Date Noted  . Tobacco abuse 11/20/2013  . Hyperlipidemia 11/20/2013  . Obesity, Class II, BMI 35-39.9, with comorbidity 04/22/2012  . Anxiety 02/26/2012  . Essential hypertension, benign 02/26/2012    Past Surgical History:  Procedure Laterality Date  . APPENDECTOMY    . medial malleolar fixation Right    Ankle  . MENISCUS REPAIR Left 2010    Social History   Tobacco Use  . Smoking status: Former Smoker    Packs/day: 0.30    Years: 10.00    Pack years: 3.00    Types: Cigarettes    Last attempt to quit: 09/14/2012    Years since quitting: 5.3  . Smokeless tobacco: Never Used  Substance Use Topics  . Alcohol use: Yes    Alcohol/week: 1.0 - 1.5 oz    Types: 2 - 3 Standard drinks or equivalent per week    Comment: Occasional use    Family History  Problem Relation Age of Onset  . Alcohol abuse Father   . Diabetes Father   . Hyperlipidemia Father   . Hypertension Father   . Gout Father   . Bipolar disorder Father   . Cancer Unknown   . Heart attack Unknown   . Depression Unknown   . Autism spectrum disorder Son   . ADD / ADHD Son      Current medication list and allergy/intolerance information reviewed:    Current Outpatient Medications  Medication Sig Dispense Refill  . AMBULATORY NON FORMULARY MEDICATION Medication Name: *New start CPAP for OSA.  SEt to 12 cm water pressure. Large size Fisher&Paykel Full Face Mask Simplus mask and heated humidification. Please fax download after one week.  Fax to [employer]. 1 Units 0  .  busPIRone (BUSPAR) 10 MG tablet Take 1 tablet (10 mg total) by mouth 2 (two) times daily. 180 tablet 0  . cyclobenzaprine (FLEXERIL) 10 MG tablet Take 1 tablet (10 mg total) by mouth 3 (three) times daily as needed for muscle spasms. 30 tablet 0  . lisinopril (PRINIVIL,ZESTRIL) 10 MG tablet TAKE 1 TABLET BY MOUTH EVERY DAY 90 tablet 1  . LORazepam (ATIVAN) 0.5 MG tablet Take 1 tablet (0.5 mg total) by mouth every 8 (eight) hours as needed for anxiety. 60 tablet 0  . pravastatin (PRAVACHOL) 40 MG tablet TAKE 1 TABLET (40 MG TOTAL) BY MOUTH DAILY. 90 tablet 1  . sertraline (ZOLOFT) 100 MG tablet Take 2 tablets (200 mg total) by mouth daily. 180 tablet 1  . amphetamine-dextroamphetamine (ADDERALL) 10 MG tablet Take 1 tablet (10 mg total) by mouth 2 (two) times daily with a meal. 60 tablet 0  . amphetamine-dextroamphetamine (ADDERALL) 10 MG tablet Take 1 tablet (10 mg total) by mouth 2 (two) times daily with a meal. 60 tablet 0  . amphetamine-dextroamphetamine (ADDERALL) 10 MG tablet Take 1 tablet (10 mg total) by mouth 2 (two) times daily with a meal. 60 tablet 0   No current facility-administered medications for this visit.     Allergies  Allergen Reactions  . Adhesive [Tape] Rash  Review of Systems:  Constitutional:  +fever, no chills, +recent illness, No unintentional weight changes. +significant fatigue.   HEENT: No  headache, no vision change, no hearing change, +sore throat, No  sinus pressure  Cardiac: No  chest pain, No  pressure, No palpitations  Respiratory:  No  shortness of breath. No  Cough  Gastrointestinal: No  abdominal pain, No  nausea, No  vomiting,  No  blood in stool, No  diarrhea   Musculoskeletal: No new myalgia/arthralgia  Skin: No  Rash  Neurologic: No  weakness, No  dizziness  Exam:  BP 133/74   Pulse 85   Temp 98.6 F (37 C) (Oral)   Wt 253 lb 11.2 oz (115.1 kg)   BMI 34.40 kg/m   Constitutional: VS see above. General Appearance: alert,  well-developed, well-nourished, NAD  Eyes: Normal lids and conjunctive, non-icteric sclera  Ears, Nose, Mouth, Throat: MMM, Normal external inspection ears/nares/mouth/lips/gums. TM normal bilaterally. Pharynx/tonsils marked erythema, no exudate. Nasal mucosa normal.   Neck: No masses, trachea midline. No tenderness/mass appreciated. No lymphadenopathy but LN tender anterior cervical  Respiratory: Normal respiratory effort. no wheeze, no rhonchi, no rales  Cardiovascular: S1/S2 normal, no murmur, no rub/gallop auscultated. RRR. No lower extremity edema.   Musculoskeletal: Gait normal.   Neurological: Normal balance/coordination. No tremor.   Skin: warm, dry, intact.   Psychiatric: Normal judgment/insight. Normal mood and affect.   Strep Swab positive   ASSESSMENT/PLAN:   Strep pharyngitis - Plan: POCT rapid strep A   Meds ordered this encounter  Medications  . Lidocaine HCl 2 % SOLN    Sig: Use as directed 5-10 mLs in the mouth or throat every 3 (three) hours as needed (mouth/throat pain).    Dispense:  100 mL    Refill:  1  . amoxicillin-clavulanate (AUGMENTIN) 875-125 MG tablet    Sig: Take 1 tablet by mouth 2 (two) times daily for 5 days.    Dispense:  10 tablet    Refill:  0     Patient Instructions  Over-the-Counter Medications & Home Remedies for Upper Respiratory Illness  Note: the following list assumes no pregnancy, normal liver & kidney function and no other drug interactions. Dr. Sheppard Coil has highlighted medications which are safe for you to use, but these may not be appropriate for everyone. Always ask a pharmacist or qualified medical provider if you have any questions!   Aches/Pains, Fever, Headache Acetaminophen (Tylenol) 500 mg tablets - take max 2 tablets (1000 mg) every 6 hours (4 times per day)  Ibuprofen (Motrin) 200 mg tablets - take max 4 tablets (800 mg) every 6 hours*  Sinus Congestion Nasal Saline if desired Oxymetolazone (Afrin, others)  sparing use due to rebound congestion, NEVER use in kids Phenylephrine (Sudafed) 10 mg tablets every 4 hours (or the 12-hour formulation)* Diphenhydramine (Benadryl) 25 mg tablets - take max 2 tablets every 4 hours  Cough & Sore Throat Dextromethorphan (Robitussin, others) - cough suppressant Guaifenesin (Robitussin, Mucinex, others) - expectorant (helps cough up mucus) (Dextromethorphan and Guaifenesin also come in a combination tablet) Lozenges w/ Benzocaine + Menthol (Cepacol) Honey - as much as you want! Teas which "coat the throat" - look for ingredients Elm Bark, Licorice Root, Marshmallow Root  Other Antibiotics if these are prescribed - take ALL, even if you're feeling better  Zinc Lozenges within 24 hours of symptoms onset - mixed evidence this shortens the duration of the common cold Don't waste your money on Vitamin C or Echinacea  *Caution  in patients with high blood pressure       Visit summary with medication list and pertinent instructions was printed for patient to review. All questions at time of visit were answered - patient instructed to contact office with any additional concerns. ER/RTC precautions were reviewed with the patient.   Follow-up plan: Return if symptoms worsen or fail to improve.    Please note: voice recognition software was used to produce this document, and typos may escape review. Please contact Dr. Sheppard Coil for any needed clarifications.

## 2018-02-06 LAB — POCT RAPID STREP A (OFFICE): Rapid Strep A Screen: POSITIVE — AB

## 2018-02-12 ENCOUNTER — Encounter: Payer: Self-pay | Admitting: Family Medicine

## 2018-02-13 ENCOUNTER — Ambulatory Visit (INDEPENDENT_AMBULATORY_CARE_PROVIDER_SITE_OTHER): Payer: BC Managed Care – PPO | Admitting: Family Medicine

## 2018-02-13 ENCOUNTER — Encounter: Payer: Self-pay | Admitting: Family Medicine

## 2018-02-13 VITALS — BP 123/66 | HR 85 | Ht 72.0 in | Wt 249.0 lb

## 2018-02-13 DIAGNOSIS — Z0000 Encounter for general adult medical examination without abnormal findings: Secondary | ICD-10-CM | POA: Diagnosis not present

## 2018-02-13 DIAGNOSIS — R5383 Other fatigue: Secondary | ICD-10-CM | POA: Diagnosis not present

## 2018-02-13 DIAGNOSIS — F330 Major depressive disorder, recurrent, mild: Secondary | ICD-10-CM | POA: Diagnosis not present

## 2018-02-13 DIAGNOSIS — F329 Major depressive disorder, single episode, unspecified: Secondary | ICD-10-CM | POA: Insufficient documentation

## 2018-02-13 DIAGNOSIS — G4733 Obstructive sleep apnea (adult) (pediatric): Secondary | ICD-10-CM

## 2018-02-13 NOTE — Progress Notes (Signed)
Subjective:    Patient ID: Stephen Orozco, male    DOB: [birth year], 47 y.o.   MRN: [id number]  HPI 47 year old male is here today for complete physical exam.  He is doing well overall.  He just got his machine in the mail for CPAP but has not set it up yet.  That he just got a phone call in the way here today to help set it up.  He still just feels extremely fatigued and is very hopeful that treating his CPAP will improve things.  He is doing well overall.  He in fact just got his CPAP machine this wek. He hasn't had a change to set it up yet.  He is hoping this will help with some of his excess fatigue.  He is applying for jobs right now.     Review of Systems Comprehensive review of systems is negative except for HPI.  BP 123/66   Pulse 85   Ht 6' (1.829 m)   Wt 249 lb (112.9 kg)   SpO2 98%   BMI 33.77 kg/m     Allergies  Allergen Reactions  . Adhesive [Tape] Rash    Past Medical History:  Diagnosis Date  . Hyperlipidemia   . Hypertension     Past Surgical History:  Procedure Laterality Date  . APPENDECTOMY    . medial malleolar fixation Right    Ankle  . MENISCUS REPAIR Left 2010    Social History   Socioeconomic History  . Marital status: Married    Spouse name: Not on file  . Number of children: 2  . Years of education: Not on file  . Highest education level: Not on file  Occupational History  . Occupation: unemployed.   Social Needs  . Financial resource strain: Not on file  . Food insecurity:    Worry: Not on file    Inability: Not on file  . Transportation needs:    Medical: Not on file    Non-medical: Not on file  Tobacco Use  . Smoking status: Former Smoker    Packs/day: 0.30    Years: 10.00    Pack years: 3.00    Types: Cigarettes    Last attempt to quit: 09/14/2012    Years since quitting: 5.4  . Smokeless tobacco: Never Used  Substance and Sexual Activity  . Alcohol use: Yes    Alcohol/week: 1.0 - 1.5 oz    Types: 2 - 3 Standard  drinks or equivalent per week    Comment: Occasional use  . Drug use: No  . Sexual activity: Yes    Partners: Female    Birth control/protection: None, Surgical  Lifestyle  . Physical activity:    Days per week: Not on file    Minutes per session: Not on file  . Stress: Not on file  Relationships  . Social connections:    Talks on phone: Not on file    Gets together: Not on file    Attends religious service: Not on file    Active member of club or organization: Not on file    Attends meetings of clubs or organizations: Not on file    Relationship status: Not on file  . Intimate partner violence:    Fear of current or ex partner: Not on file    Emotionally abused: Not on file    Physically abused: Not on file    Forced sexual activity: Not on file  Other Topics Concern  . Not on  file  Social History Narrative   1 half caff drink daily. No regular exercise. Wife is a Marine scientist. He is unemployed right now.     Family History  Problem Relation Age of Onset  . Alcohol abuse Father   . Diabetes Father   . Hyperlipidemia Father   . Hypertension Father   . Gout Father   . Bipolar disorder Father   . Cancer Unknown   . Heart attack Unknown   . Depression Unknown   . Autism spectrum disorder Son   . ADD / ADHD Son     Outpatient Encounter Medications as of 02/13/2018  Medication Sig  . AMBULATORY NON FORMULARY MEDICATION Medication Name: *New start CPAP for OSA.  SEt to 12 cm water pressure. Large size Fisher&Paykel Full Face Mask Simplus mask and heated humidification. Please fax download after one week.  Fax to [facility].  . busPIRone (BUSPAR) 10 MG tablet Take 1 tablet (10 mg total) by mouth 2 (two) times daily.  . cyclobenzaprine (FLEXERIL) 10 MG tablet Take 1 tablet (10 mg total) by mouth 3 (three) times daily as needed for muscle spasms.  Marland Kitchen lisinopril (PRINIVIL,ZESTRIL) 10 MG tablet TAKE 1 TABLET BY MOUTH EVERY DAY  . LORazepam (ATIVAN) 0.5 MG tablet Take 1 tablet (0.5 mg  total) by mouth every 8 (eight) hours as needed for anxiety.  . pravastatin (PRAVACHOL) 40 MG tablet TAKE 1 TABLET (40 MG TOTAL) BY MOUTH DAILY.  Marland Kitchen sertraline (ZOLOFT) 100 MG tablet Take 2 tablets (200 mg total) by mouth daily.  Marland Kitchen amphetamine-dextroamphetamine (ADDERALL) 10 MG tablet Take 1 tablet (10 mg total) by mouth 2 (two) times daily with a meal.  . amphetamine-dextroamphetamine (ADDERALL) 10 MG tablet Take 1 tablet (10 mg total) by mouth 2 (two) times daily with a meal.  . amphetamine-dextroamphetamine (ADDERALL) 10 MG tablet Take 1 tablet (10 mg total) by mouth 2 (two) times daily with a meal.  . [DISCONTINUED] Lidocaine HCl 2 % SOLN Use as directed 5-10 mLs in the mouth or throat every 3 (three) hours as needed (mouth/throat pain).   No facility-administered encounter medications on file as of 02/13/2018.            Objective:   Physical Exam  Constitutional: He is oriented to person, place, and time. He appears well-developed and well-nourished.  HENT:  Head: Normocephalic and atraumatic.  Right Ear: External ear normal.  Left Ear: External ear normal.  Nose: Nose normal.  Mouth/Throat: Oropharynx is clear and moist.  Eyes: Pupils are equal, round, and reactive to light. Conjunctivae and EOM are normal.  Neck: Normal range of motion. Neck supple. No thyromegaly present.  Cardiovascular: Normal rate, regular rhythm, normal heart sounds and intact distal pulses.  Pulmonary/Chest: Effort normal and breath sounds normal.  Abdominal: Soft. Bowel sounds are normal. He exhibits no distension and no mass. There is no tenderness. There is no rebound and no guarding.  Musculoskeletal: Normal range of motion.  Lymphadenopathy:    He has no cervical adenopathy.  Neurological: He is alert and oriented to person, place, and time. He has normal reflexes.  Skin: Skin is warm and dry.  Psychiatric: He has a normal mood and affect. His behavior is normal. Judgment and thought content normal.        Assessment & Plan:  CPE Keep up a regular exercise program and make sure you are eating a healthy diet Try to eat 4 servings of dairy a day, or if you are lactose intolerant take  a calcium with vitamin D daily.  Your vaccines are up to date.  Due for labs.  He will try to go tomorrow.   Major depressive disorder-PHQ 9 score of 8 and gad 7 score of 2.  He sees psychiatry for management.  OSA- F/U in one month after start CPAP.

## 2018-02-13 NOTE — Patient Instructions (Signed)

## 2018-02-18 ENCOUNTER — Other Ambulatory Visit: Payer: Self-pay | Admitting: Family Medicine

## 2018-02-18 DIAGNOSIS — I10 Essential (primary) hypertension: Secondary | ICD-10-CM

## 2018-02-22 LAB — COMPLETE METABOLIC PANEL WITH GFR
AG RATIO: 2 (calc) (ref 1.0–2.5)
ALBUMIN MSPROF: 4.5 g/dL (ref 3.6–5.1)
ALT: 25 U/L (ref 9–46)
AST: 20 U/L (ref 10–40)
Alkaline phosphatase (APISO): 63 U/L (ref 40–115)
BILIRUBIN TOTAL: 0.5 mg/dL (ref 0.2–1.2)
BUN: 14 mg/dL (ref 7–25)
CO2: 27 mmol/L (ref 20–32)
Calcium: 9.5 mg/dL (ref 8.6–10.3)
Chloride: 101 mmol/L (ref 98–110)
Creat: 0.97 mg/dL (ref 0.60–1.35)
GFR, EST AFRICAN AMERICAN: 107 mL/min/{1.73_m2} (ref >=60)
GFR, Est Non African American: 93 mL/min/{1.73_m2} (ref >=60)
Globulin: 2.3 g/dL (calc) (ref 1.9–3.7)
Glucose, Bld: 97 mg/dL (ref 65–99)
Potassium: 4.2 mmol/L (ref 3.5–5.3)
SODIUM: 135 mmol/L (ref 135–146)
TOTAL PROTEIN: 6.8 g/dL (ref 6.1–8.1)

## 2018-02-22 LAB — TSH: TSH: 1.73 mIU/L (ref 0.40–4.50)

## 2018-02-22 LAB — LIPID PANEL
Cholesterol: 233 mg/dL — ABNORMAL HIGH (ref <200)
HDL: 40 mg/dL — ABNORMAL LOW (ref >40)
LDL Cholesterol (Calc): 156 mg/dL (calc) — ABNORMAL HIGH
NON-HDL CHOLESTEROL (CALC): 193 mg/dL — AB (ref <130)
TRIGLYCERIDES: 221 mg/dL — AB (ref <150)
Total CHOL/HDL Ratio: 5.8 (calc) — ABNORMAL HIGH (ref <5.0)

## 2018-02-22 LAB — URIC ACID: URIC ACID, SERUM: 6.6 mg/dL (ref 4.0–8.0)

## 2018-02-22 LAB — CBC
HCT: 40.7 % (ref 38.5–50.0)
HEMOGLOBIN: 14 g/dL (ref 13.2–17.1)
MCH: 30.4 pg (ref 27.0–33.0)
MCHC: 34.4 g/dL (ref 32.0–36.0)
MCV: 88.3 fL (ref 80.0–100.0)
MPV: 10 fL (ref 7.5–12.5)
PLATELETS: 169 10*3/uL (ref 140–400)
RBC: 4.61 10*6/uL (ref 4.20–5.80)
RDW: 13.5 % (ref 11.0–15.0)
WBC: 6 10*3/uL (ref 3.8–10.8)

## 2018-02-22 LAB — HEMOGLOBIN A1C
EAG (MMOL/L): 6.3 (calc)
Hgb A1c MFr Bld: 5.6 % of total Hgb (ref <5.7)
Mean Plasma Glucose: 114 (calc)

## 2018-02-22 LAB — TESTOSTERONE: Testosterone: 182 ng/dL — ABNORMAL LOW (ref 250–827)

## 2018-02-24 ENCOUNTER — Other Ambulatory Visit: Payer: Self-pay | Admitting: *Deleted

## 2018-02-24 DIAGNOSIS — R7989 Other specified abnormal findings of blood chemistry: Secondary | ICD-10-CM

## 2018-03-11 ENCOUNTER — Ambulatory Visit (INDEPENDENT_AMBULATORY_CARE_PROVIDER_SITE_OTHER): Payer: BC Managed Care – PPO | Admitting: Licensed Clinical Social Worker

## 2018-03-11 DIAGNOSIS — F411 Generalized anxiety disorder: Secondary | ICD-10-CM

## 2018-03-11 DIAGNOSIS — F331 Major depressive disorder, recurrent, moderate: Secondary | ICD-10-CM

## 2018-03-11 NOTE — Progress Notes (Signed)
   THERAPIST PROGRESS NOTE  Session Time: 11:07 AM to 12:01 PM  Participation Level: Active  Behavioral Response: CasualAlertEuthymic  Type of Therapy: Individual Therapy  Treatment Goals addressed:   reduce overall level, frequency and intensity of anxiety so that daily functioning is not impaired, coping, decrease symptoms of depression  Interventions: CBT, Solution Focused, Strength-based and Reframing  Summary: Stephen Orozco is a 47 y.o. male who presents with major depressive disorder, recurrent, moderate, generalized anxiety disorder.    Suicidal/Homicidal: No  Therapist Response: Patient shared that C-PAP machine not helping as far as fatigue, is sleeping more so helpful in that respect but still dealing with significant fatigue. 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 relates 2 years having energy and exercising, felt healthy and has been on a decline, aging related to decline, caught up with him, herniated  disc, degenerative disc disease and arthritis, two years ago had energy and exercising. This week best felt in awhile, but even this week 2-3 days felt terrible, didn't have the energy to plant, doctor recommends exercise, and healthy eating, but patient not able to exercise. Doctor giving him stretch exercises for herniated disc. But keep slipping back. Patient shares he is always been the one to work and also do everything at home, wife is worried trying to figure out what's wrong and she is helping a home with chores at home. Patient reviewed symptoms and related for awhile things get worse, reviewed treatment goals and relates still has depression, discussed difficulty of looking for work since last November. Shares at a point where nervous about everything, continue to deplete savings and that worries him. Reviewed treatment plan and coping strategies he uses with some  success.he describes his strength as determination and wants to get back to career, be healthy spend time with kids and help and fix the house and determination helps him to keep working on that.  Patient reviewed meds and has run out of [place] has given him the wrong dosage for Zoloft, he is prescribed 100 mg and meds are at 50 mg so needs to talk to Dr. About this. 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 Assess patient in current functioning per report. Therapist discussed stressors and coping with stressors. Reviewed treatment plan and identified ongoing issues most significantly for depression but also anxiety. Therapist was able to review patient's coping strategies that included think of things from a positive perspective, talking himself down, breathing, self soothing. Help patient to process feelings around stressors but also identified uncovering physical aspects that could play an important role in his symptoms. Therapist utilized strategies to encourage patient in perspective of being hopeful related to positive news, and looking at situation to see ways that could lead to further positive developments with career.Provided supportive and strength-based intervention Plan: Return again in 4 weeks.2.was continued to work with patient on coping skills for anxiety and depression  Diagnosis: Axis I:   major depressive disorder, recurrent, moderate, generalized anxiety disorder    Axis II: No diagnosis    Cordella Register, LCSW 03/11/2018

## 2018-03-13 LAB — FSH/LH
FSH: 6.8 m[IU]/mL (ref 1.6–8.0)
LH: 2 m[IU]/mL (ref 1.5–9.3)

## 2018-03-13 LAB — TESTOSTERONE: Testosterone: 213 ng/dL — ABNORMAL LOW (ref 250–827)

## 2018-03-17 ENCOUNTER — Encounter: Payer: Self-pay | Admitting: Family Medicine

## 2018-03-17 ENCOUNTER — Ambulatory Visit (INDEPENDENT_AMBULATORY_CARE_PROVIDER_SITE_OTHER): Payer: BC Managed Care – PPO | Admitting: Family Medicine

## 2018-03-17 VITALS — BP 111/73 | HR 97 | Ht 72.0 in | Wt 254.0 lb

## 2018-03-17 DIAGNOSIS — R7989 Other specified abnormal findings of blood chemistry: Secondary | ICD-10-CM

## 2018-03-17 DIAGNOSIS — G4733 Obstructive sleep apnea (adult) (pediatric): Secondary | ICD-10-CM | POA: Diagnosis not present

## 2018-03-17 DIAGNOSIS — Z86718 Personal history of other venous thrombosis and embolism: Secondary | ICD-10-CM | POA: Insufficient documentation

## 2018-03-17 NOTE — Progress Notes (Signed)
   Subjective:    Patient ID: Stephen Orozco, male    DOB: Aug 01, 1971, 47 y.o.   MRN: [id number]  HPI  47 year old male with a recently new diagnosis of sleep apnea comes in today for follow-up.  He is been using his CPAP for about 1 month at this point in time. He is getting supplies through [facility]. He has been has been wearing his CPAP every night.  He says some nights he falls asleep on the couch and does not put it on until late but he is worn it every night since he has had it for at least 4 hours.  He showed me his downloads on his phone and he is been wearing it on average around 6-1/2 hours each night.  Over the last week he has actually noticed a little bit of improvement in his energy levels.  He was hoping the response to the CPAP would be a little bit more brisk.  Did warn about potential side effects including increased risk for heart events  Also recently confirmed to low testosterone levels.  I asked him to come into that we can review the results and discuss treatment options. FSH and LH were normal.   Review of Systems     Objective:   Physical Exam  Constitutional: He is oriented to person, place, and time. He appears well-developed and well-nourished.  HENT:  Head: Normocephalic and atraumatic.  Cardiovascular: Normal rate, regular rhythm and normal heart sounds.  Pulmonary/Chest: Effort normal and breath sounds normal.  Neurological: He is alert and oriented to person, place, and time.  Skin: Skin is warm and dry.  Psychiatric: He has a normal mood and affect. His behavior is normal.       Assessment & Plan:  OSA - He is doing well overall.  He showed me on his phone app that he is been using it consistently.  His AHI does look a little bit elevated on a couple of days but overall seems to be well controlled.  We will call to get a download to verify and may need to increase his pressure by 1 or 2 cm of water pressure.    Hypogonadism/low testosterone- discussed  options.  Discussed potential side effects of the medication.  He is worried about the potential for blood clots but I did look this up and evidently it was found in postmarketing cases and was less than 1% so I think would overall be low risk that he does have a prior history of a blood clot.  We discussed that there are different treatment types.  I did recommend that he work on increased activity exercise and weight loss and then recheck his testosterone levels and anywhere between 3 to 6 months before starting testosterone replacement therapy.  I think it could be contributing to the low levels particularly since his [place] and LH were normal.

## 2018-03-20 ENCOUNTER — Encounter (HOSPITAL_COMMUNITY): Payer: Self-pay | Admitting: Psychiatry

## 2018-03-20 ENCOUNTER — Other Ambulatory Visit (HOSPITAL_COMMUNITY): Payer: Self-pay | Admitting: Psychiatry

## 2018-03-20 DIAGNOSIS — F3341 Major depressive disorder, recurrent, in partial remission: Secondary | ICD-10-CM

## 2018-03-20 MED ORDER — BUSPIRONE HCL 10 MG PO TABS: 10.0000 mg | ORAL_TABLET | Freq: Two times a day (BID) | ORAL | 0 refills | Status: AC

## 2018-03-29 ENCOUNTER — Encounter (HOSPITAL_COMMUNITY): Payer: Self-pay | Admitting: Psychiatry

## 2018-03-31 ENCOUNTER — Other Ambulatory Visit (HOSPITAL_COMMUNITY): Payer: Self-pay | Admitting: Psychiatry

## 2018-03-31 DIAGNOSIS — G4730 Sleep apnea, unspecified: Secondary | ICD-10-CM

## 2018-03-31 DIAGNOSIS — R5382 Chronic fatigue, unspecified: Secondary | ICD-10-CM

## 2018-03-31 DIAGNOSIS — F3341 Major depressive disorder, recurrent, in partial remission: Secondary | ICD-10-CM

## 2018-03-31 MED ORDER — SERTRALINE HCL 100 MG PO TABS: 200.0000 mg | ORAL_TABLET | Freq: Every day | ORAL | 1 refills | Status: DC

## 2018-04-06 ENCOUNTER — Encounter (HOSPITAL_COMMUNITY): Payer: Self-pay | Admitting: Psychiatry

## 2018-04-07 ENCOUNTER — Other Ambulatory Visit (HOSPITAL_COMMUNITY): Payer: Self-pay | Admitting: Psychiatry

## 2018-04-07 ENCOUNTER — Ambulatory Visit (HOSPITAL_COMMUNITY): Payer: BC Managed Care – PPO | Admitting: Licensed Clinical Social Worker

## 2018-04-07 MED ORDER — AMPHETAMINE-DEXTROAMPHETAMINE 10 MG PO TABS
10.0000 mg | ORAL_TABLET | Freq: Two times a day (BID) | ORAL | 0 refills | Status: DC
Start: 2018-04-07 — End: 2018-05-01

## 2018-04-08 ENCOUNTER — Other Ambulatory Visit: Payer: Self-pay | Admitting: Family Medicine

## 2018-04-15 ENCOUNTER — Ambulatory Visit (HOSPITAL_COMMUNITY): Payer: Self-pay | Admitting: Psychiatry

## 2018-05-01 ENCOUNTER — Ambulatory Visit (HOSPITAL_COMMUNITY): Payer: BC Managed Care – PPO | Admitting: Psychiatry

## 2018-05-01 ENCOUNTER — Encounter (HOSPITAL_COMMUNITY): Payer: Self-pay | Admitting: Psychiatry

## 2018-05-01 VITALS — BP 130/87 | HR 88 | Ht 72.0 in | Wt 249.0 lb

## 2018-05-01 DIAGNOSIS — R5382 Chronic fatigue, unspecified: Secondary | ICD-10-CM | POA: Diagnosis not present

## 2018-05-01 DIAGNOSIS — Z818 Family history of other mental and behavioral disorders: Secondary | ICD-10-CM | POA: Diagnosis not present

## 2018-05-01 DIAGNOSIS — F3341 Major depressive disorder, recurrent, in partial remission: Secondary | ICD-10-CM

## 2018-05-01 DIAGNOSIS — Z87891 Personal history of nicotine dependence: Secondary | ICD-10-CM

## 2018-05-01 DIAGNOSIS — Z811 Family history of alcohol abuse and dependence: Secondary | ICD-10-CM | POA: Diagnosis not present

## 2018-05-01 MED ORDER — AMPHETAMINE-DEXTROAMPHETAMINE 10 MG PO TABS: 10.0000 mg | ORAL_TABLET | Freq: Two times a day (BID) | ORAL | 0 refills | Status: DC

## 2018-05-01 MED ORDER — AMPHETAMINE-DEXTROAMPHETAMINE 10 MG PO TABS
10.0000 mg | ORAL_TABLET | Freq: Two times a day (BID) | ORAL | 0 refills | Status: DC
Start: 2018-06-30 — End: 2019-07-02

## 2018-05-01 NOTE — Progress Notes (Signed)
[facility] MD/PA/NP OP Progress Note  05/01/2018 4:06 PM Stephen Orozco  MRN:  [id number]  Chief Complaint: med check, PHP follow-up  HPI: Stephen Orozco reports overall mood stability and reports that the combination of Zoloft, BuSpar and Adderall seems to be serving him well.  Denies any other acute concerns or safety issues.  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  He is sleeping well at night, and he is working on weight loss and exercise goals to help with his testosterone.  Disclosed to patient that this Probation officer is leaving this practice at the end of August 2019, and patients always has the right to choose their provider. Reassured patient that office will work to provide smooth transition of care whether they wish to remain at this office, or to continue with this provider, or seek alternative care options in community.  They expressed understanding.   Visit Diagnosis:    ICD-10-CM   1. Recurrent major depressive disorder, in partial remission ([place]) F33.41   2. Chronic fatigue R53.82     Past Psychiatric History: Psychiatric hospitalization at [facility], and participation in the [facility] program, previously saw this Probation officer in the context of PHP treatment  Past Medical History:  Past Medical History:  Diagnosis Date  . Hyperlipidemia   . Hypertension     Past Surgical History:  Procedure Laterality Date  . APPENDECTOMY    . medial malleolar fixation Right    Ankle  . MENISCUS REPAIR Left 2010    Family Psychiatric History: See intake H&P for full details. Reviewed, with no updates at this time.   Family History:  Family History  Problem Relation Age of Onset  . Alcohol abuse Father   . Diabetes Father   . Hyperlipidemia Father   . Hypertension Father   . Gout Father   . Bipolar disorder Father   . Cancer Unknown   . Heart attack Unknown   . Depression Unknown   . Autism spectrum disorder Son   . ADD /  ADHD Son     Social History:  Social History   Socioeconomic History  . Marital status: Married    Spouse name: Not on file  . Number of children: 2  . Years of education: Not on file  . Highest education level: Not on file  Occupational History  . Occupation: unemployed.   Social Needs  . Financial resource strain: Not on file  . Food insecurity:    Worry: Not on file    Inability: Not on file  . Transportation needs:    Medical: Not on file    Non-medical: Not on file  Tobacco Use  . Smoking status: Former Smoker    Packs/day: 0.30    Years: 10.00    Pack years: 3.00    Types: Cigarettes    Last attempt to quit: 09/14/2012    Years since quitting: 5.6  . Smokeless tobacco: Never Used  Substance and Sexual Activity  . Alcohol use: Yes    Alcohol/week: 1.2 - 1.8 oz    Types: 2 - 3 Standard drinks or equivalent per week    Comment: Occasional use  . Drug use: No  . Sexual activity: Yes    Partners: Female    Birth control/protection: None, Surgical  Lifestyle  . Physical activity:    Days per week: Not on file    Minutes per session: Not on file  . Stress:  Not on file  Relationships  . Social connections:    Talks on phone: Not on file    Gets together: Not on file    Attends religious service: Not on file    Active member of club or organization: Not on file    Attends meetings of clubs or organizations: Not on file    Relationship status: Not on file  Other Topics Concern  . Not on file  Social History Narrative   1 half caff drink daily. No regular exercise. Wife is a Marine scientist. He is unemployed right now.     Allergies:  Allergies  Allergen Reactions  . Adhesive [Tape] Rash    Metabolic Disorder Labs: Lab Results  Component Value Date   HGBA1C 5.6 02/21/2018   MPG 114 02/21/2018   MPG 114 02/15/2017   No results found for: PROLACTIN Lab Results  Component Value Date   CHOL 233 (H) 02/21/2018   TRIG 221 (H) 02/21/2018   HDL 40 (L) 02/21/2018    CHOLHDL 5.8 (H) 02/21/2018   VLDL 26 12/16/2015   LDLCALC 156 (H) 02/21/2018   LDLCALC 126 12/16/2015   Lab Results  Component Value Date   TSH 1.73 02/21/2018   TSH 1.57 02/15/2017    Therapeutic Level Labs: No results found for: LITHIUM No results found for: VALPROATE No components found for:  CBMZ  Current Medications: Current Outpatient Medications  Medication Sig Dispense Refill  . AMBULATORY NON FORMULARY MEDICATION Medication Name: *New start CPAP for OSA.  SEt to 12 cm water pressure. Large size Fisher&Paykel Full Face Mask Simplus mask and heated humidification. Please fax download after one week.  Fax to [facility]. 1 Units 0  . [START ON 06/30/2018] amphetamine-dextroamphetamine (ADDERALL) 10 MG tablet Take 1 tablet (10 mg total) by mouth 2 (two) times daily with a meal. 60 tablet 0  . [START ON 05/31/2018] amphetamine-dextroamphetamine (ADDERALL) 10 MG tablet Take 1 tablet (10 mg total) by mouth 2 (two) times daily with a meal. 60 tablet 0  . amphetamine-dextroamphetamine (ADDERALL) 10 MG tablet Take 1 tablet (10 mg total) by mouth 2 (two) times daily with a meal. 60 tablet 0  . busPIRone (BUSPAR) 10 MG tablet Take 1 tablet (10 mg total) by mouth 2 (two) times daily. 180 tablet 0  . cyclobenzaprine (FLEXERIL) 10 MG tablet Take 1 tablet (10 mg total) by mouth 3 (three) times daily as needed for muscle spasms. 30 tablet 0  . lisinopril (PRINIVIL,ZESTRIL) 10 MG tablet TAKE 1 TABLET BY MOUTH EVERY DAY 90 tablet 1  . pravastatin (PRAVACHOL) 40 MG tablet TAKE 1 TABLET (40 MG TOTAL) BY MOUTH DAILY. 90 tablet 1  . sertraline (ZOLOFT) 100 MG tablet Take 2 tablets (200 mg total) by mouth daily. 180 tablet 1   No current facility-administered medications for this visit.     Musculoskeletal: Strength & Muscle Tone: within normal limits Gait & Station: normal Patient leans: N/A  Psychiatric Specialty Exam: ROS  Blood pressure 130/87, pulse 88, height 6' (1.829 m), weight 249 lb  (112.9 kg).Body mass index is 33.77 kg/m.  General Appearance: Casual and Well Groomed  Eye Contact:  Good  Speech:  Clear and Coherent  Volume:  Normal  Mood:  Euthymic  Affect:  Congruent  Thought Process:  Goal Directed and Descriptions of Associations: Intact  Orientation:  Full (Time, Place, and Person)  Thought Content: Logical   Suicidal Thoughts:  No  Homicidal Thoughts:  No  Memory:  Immediate;   Fair  Judgement:  Good  Insight:  Good  Psychomotor Activity:  Normal  Concentration:  Concentration: Fair  Recall:  Good  Fund of Knowledge: Good  Language: Good  Akathisia:  Negative  Handed:  Right  AIMS (if indicated): not done  Assets:  Communication Skills Desire for Improvement Financial Resources/Insurance Housing Intimacy Leisure Time [place] Talents/Skills Transportation Vocational/Educational  ADL's:  Intact  Cognition: WNL  Sleep:  Good   Screenings: GAD-7     Office Visit from 02/13/2018 in [place] Counselor from 08/14/2017 in [place] Counselor from 07/23/2017 in [place] Office Visit from 12/16/2015 in [place]  Total GAD-7 Score  2  4  15  9     PHQ2-9     Office Visit from 02/13/2018 in [place] Counselor from 08/14/2017 in [place] Counselor from 08/09/2017 in [place] Counselor from 07/23/2017 in [place] Office Visit from 02/15/2017 in [place]  PHQ-2 Total Score  3  1  3  4  3   PHQ-9 Total Score  8  4  16  20  12        Assessment and Plan:  Asad Keeven presents with good symptom control on the following regimen, is working with his primary care doctor on  strategies to improve low testosterone, including diet and exercise.  No acute safety concerns and his medications seem to be well tolerated, no side effects to report.  We will follow-up in 3 months or sooner if needed.  1. Recurrent major depressive disorder, in partial remission ([place])   2. Chronic fatigue     Status of current problems: stable  Labs Ordered: No orders of the defined types were placed in this encounter.   Labs Reviewed: n/a  Collateral Obtained/Records Reviewed: n/a  Plan:  Continue Zoloft 200 mg daily Continue Adderall 10 mg twice a day, 3 months prescribed Continue BuSpar 10 mg twice a day for generalized anxiety Return to clinic in 10-12 weeks   Aundra Dubin, MD 05/01/2018, 4:06 PM

## 2018-06-09 ENCOUNTER — Other Ambulatory Visit: Payer: Self-pay | Admitting: Family Medicine

## 2018-06-09 DIAGNOSIS — E785 Hyperlipidemia, unspecified: Secondary | ICD-10-CM

## 2018-06-17 ENCOUNTER — Ambulatory Visit: Payer: Self-pay | Admitting: Family Medicine

## 2018-10-02 ENCOUNTER — Ambulatory Visit (INDEPENDENT_AMBULATORY_CARE_PROVIDER_SITE_OTHER): Payer: BC Managed Care – PPO | Admitting: Family Medicine

## 2018-10-02 ENCOUNTER — Encounter: Payer: Self-pay | Admitting: Family Medicine

## 2018-10-02 VITALS — BP 133/80 | HR 60 | Ht 72.0 in | Wt 258.0 lb

## 2018-10-02 DIAGNOSIS — G4733 Obstructive sleep apnea (adult) (pediatric): Secondary | ICD-10-CM

## 2018-10-02 DIAGNOSIS — R7989 Other specified abnormal findings of blood chemistry: Secondary | ICD-10-CM

## 2018-10-02 DIAGNOSIS — N489 Disorder of penis, unspecified: Secondary | ICD-10-CM | POA: Diagnosis not present

## 2018-10-02 DIAGNOSIS — I10 Essential (primary) hypertension: Secondary | ICD-10-CM | POA: Diagnosis not present

## 2018-10-02 DIAGNOSIS — N529 Male erectile dysfunction, unspecified: Secondary | ICD-10-CM

## 2018-10-02 NOTE — Patient Instructions (Signed)
If testosterone is low and you want to start medication let me know and then I want to see you after you have been on new medication for 6 weeks.

## 2018-10-02 NOTE — Progress Notes (Signed)
Subjective:    CC:   HPI:  F/U low testosterone.  Here to follow-up on low testosterone.  He had 2 previously low levels.  We had discussed getting his sleep apnea treated.  The hope was that that would also help with his ability to lose weight and that would in turn actually improve his testosterone levels.  He is here to have his levels rechecked.  His biggest concern is that he just really feels fatigued and worn out especially by the end of the day.  Hypertension- Pt denies chest pain, SOB, dizziness, or heart palpitations.  Taking meds as directed w/o problems.  Denies medication side effects.    OSA f/u -uses CPAP every night.  He is doing really well he is been tracking his apnea-hypopnea index and overall it has been consistently well controlled.  He has had 2 low testosterone levels.  182 approximately 7 months ago and then 6 months ago it was 213.  Plan is to recheck levels again today and also discussed possible options for testosterone replacement.  Is requesting referral to urology as well.  He is had some problems with erectile dysfunction which he feels is getting worse and he is also noticed some curvature to his penis.  He said he seen the commercials on TV and wonders if he could have that specific condition.  Regards to his depression-he follows with psychiatry and they are currently prescribing his Zoloft, BuSpar and Adderall.  Past medical history, Surgical history, Family history not pertinant except as noted below, Social history, Allergies, and medications have been entered into the medical record, reviewed, and corrections made.   Review of Systems: No fevers, chills, night sweats, weight loss, chest pain, or shortness of breath.   Objective:    General: Well Developed, well nourished, and in no acute distress.  Neuro: Alert and oriented x3, extra-ocular muscles intact, sensation grossly intact.  HEENT: Normocephalic, atraumatic  Skin: Warm and dry, no  rashes. Cardiac: Regular rate and rhythm, no murmurs rubs or gallops, no lower extremity edema.  Respiratory: Clear to auscultation bilaterally. Not using accessory muscles, speaking in full sentences.   Impression and Recommendations:   HTN - Well controlled. Continue current regimen. Follow up in  6 months.    OSA -he is doing a consistent job of wearing his CPAP and is doing well with it.  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 Will refer to Urology  ED -refer to urology.  Did discuss that there are certainly medications that can be very helpful but since he is also having a concerned about curvature to his penis I think it is appropriate that they go ahead and address both at that visit.  But I am more than happy to try an ED medication in the interim if he would like.  Low testosterone-discussed supplementation and different forms that it comes in.  We also discussed potential side effects and current warnings for increased risk of heart disease with testosterone replacement.  He says is at the point now that with his quality of life he is actually willing to at least consider a trial of the medication.

## 2018-10-10 ENCOUNTER — Ambulatory Visit (INDEPENDENT_AMBULATORY_CARE_PROVIDER_SITE_OTHER): Payer: BC Managed Care – PPO | Admitting: Family Medicine

## 2018-10-10 ENCOUNTER — Encounter: Payer: Self-pay | Admitting: Family Medicine

## 2018-10-10 VITALS — BP 126/70 | HR 83 | Ht 72.0 in | Wt 260.0 lb

## 2018-10-10 DIAGNOSIS — M549 Dorsalgia, unspecified: Secondary | ICD-10-CM | POA: Diagnosis not present

## 2018-10-10 MED ORDER — METAXALONE 400 MG PO TABS: 200.0000 mg | ORAL_TABLET | Freq: Two times a day (BID) | ORAL | 0 refills | Status: DC | PRN

## 2018-10-10 NOTE — Progress Notes (Signed)
Acute Office Visit  Subjective:    Patient ID: Mubarak Bevens, male    DOB: 08/20/1971, 47 y.o.   MRN: [id number]  Chief Complaint  Patient presents with  . Back Pain    HPI Patient is in today for left flank pain x 1 week. Says pain is throbbing and was 5/10 yesterday. More like 3/10 today. Using heat, ice and stretches. Had his wife massage it.  No know trauma or injury. He does have some mild degenerative changes of the lumbar spine.  He is also been using Aspercreme and that seems to help some as well.  He denies any specific injury or trauma.   Past Medical History:  Diagnosis Date  . Hyperlipidemia   . Hypertension     Past Surgical History:  Procedure Laterality Date  . APPENDECTOMY    . medial malleolar fixation Right    Ankle  . MENISCUS REPAIR Left 2010    Family History  Problem Relation Age of Onset  . Alcohol abuse Father   . Diabetes Father   . Hyperlipidemia Father   . Hypertension Father   . Gout Father   . Bipolar disorder Father   . Cancer Unknown   . Heart attack Unknown   . Depression Unknown   . Autism spectrum disorder Son   . ADD / ADHD Son     Social History   Socioeconomic History  . Marital status: Married    Spouse name: Not on file  . Number of children: 2  . Years of education: Not on file  . Highest education level: Not on file  Occupational History  . Occupation: unemployed.   Social Needs  . Financial resource strain: Not on file  . Food insecurity:    Worry: Not on file    Inability: Not on file  . Transportation needs:    Medical: Not on file    Non-medical: Not on file  Tobacco Use  . Smoking status: Former Smoker    Packs/day: 0.30    Years: 10.00    Pack years: 3.00    Types: Cigarettes    Last attempt to quit: 09/14/2012    Years since quitting: 6.0  . Smokeless tobacco: Never Used  Substance and Sexual Activity  . Alcohol use: Yes    Alcohol/week: 2.0 - 3.0 standard drinks    Types: 2 - 3 Standard  drinks or equivalent per week    Comment: Occasional use  . Drug use: No  . Sexual activity: Yes    Partners: Female    Birth control/protection: None, Surgical  Lifestyle  . Physical activity:    Days per week: Not on file    Minutes per session: Not on file  . Stress: Not on file  Relationships  . Social connections:    Talks on phone: Not on file    Gets together: Not on file    Attends religious service: Not on file    Active member of club or organization: Not on file    Attends meetings of clubs or organizations: Not on file    Relationship status: Not on file  . Intimate partner violence:    Fear of current or ex partner: Not on file    Emotionally abused: Not on file    Physically abused: Not on file    Forced sexual activity: Not on file  Other Topics Concern  . Not on file  Social History Narrative   1 half caff drink daily.  No regular exercise. Wife is a Marine scientist. He is unemployed right now.     Outpatient Medications Prior to Visit  Medication Sig Dispense Refill  . AMBULATORY NON FORMULARY MEDICATION Medication Name: *New start CPAP for OSA.  SEt to 12 cm water pressure. Large size Fisher&Paykel Full Face Mask Simplus mask and heated humidification. Please fax download after one week.  Fax to [facility]. 1 Units 0  . busPIRone (BUSPAR) 10 MG tablet Take 1 tablet (10 mg total) by mouth 2 (two) times daily. 180 tablet 0  . lisinopril (PRINIVIL,ZESTRIL) 10 MG tablet TAKE 1 TABLET BY MOUTH EVERY DAY 90 tablet 1  . pravastatin (PRAVACHOL) 40 MG tablet TAKE 1 TABLET BY MOUTH EVERY DAY 90 tablet 3  . sertraline (ZOLOFT) 100 MG tablet Take 2 tablets (200 mg total) by mouth daily. 180 tablet 1  . cyclobenzaprine (FLEXERIL) 10 MG tablet Take 1 tablet (10 mg total) by mouth 3 (three) times daily as needed for muscle spasms. 30 tablet 0  . amphetamine-dextroamphetamine (ADDERALL) 10 MG tablet Take 1 tablet (10 mg total) by mouth 2 (two) times daily with a meal. 60 tablet 0  .  amphetamine-dextroamphetamine (ADDERALL) 10 MG tablet Take 1 tablet (10 mg total) by mouth 2 (two) times daily with a meal. 60 tablet 0  . amphetamine-dextroamphetamine (ADDERALL) 10 MG tablet Take 1 tablet (10 mg total) by mouth 2 (two) times daily with a meal. 60 tablet 0   No facility-administered medications prior to visit.     Allergies  Allergen Reactions  . Flexeril [Cyclobenzaprine] Other (See Comments)    Excess sedation  . Adhesive [Tape] Rash    ROS     Objective:    Physical Exam  Constitutional: He is oriented to person, place, and time. He appears well-developed and well-nourished.  HENT:  Head: Normocephalic and atraumatic.  Eyes: Conjunctivae and EOM are normal.  Cardiovascular: Normal rate.  Pulmonary/Chest: Effort normal.  Musculoskeletal:     Comments: No lumbar flexion, extension, rotation and side bending.  He did have more pain with extension and with side bending to the right.  Nontender over the thoracic or lumbar spine.  Nontender over the SI joints.  A little bit tender laterally on the left side just above the hip and below the rib cage.  Straight leg raise bilaterally.  Hip, knee, ankle strength is 5-5.  Patellar reflexes 2+ bilaterally.  Neurological: He is alert and oriented to person, place, and time.  Skin: Skin is dry. No pallor.  Psychiatric: He has a normal mood and affect. His behavior is normal.  Vitals reviewed.   BP 126/70   Pulse 83   Ht 6' (1.829 m)   Wt 260 lb (117.9 kg)   SpO2 100%   BMI 35.26 kg/m  Wt Readings from Last 3 Encounters:  10/10/18 260 lb (117.9 kg)  10/02/18 258 lb (117 kg)  03/17/18 254 lb (115.2 kg)    There are no preventive care reminders to display for this patient.  There are no preventive care reminders to display for this patient.   Lab Results  Component Value Date   TSH 1.73 02/21/2018   Lab Results  Component Value Date   WBC 6.0 02/21/2018   HGB 14.0 02/21/2018   HCT 40.7 02/21/2018   MCV  88.3 02/21/2018   PLT 169 02/21/2018   Lab Results  Component Value Date   NA 135 02/21/2018   K 4.2 02/21/2018   CO2 27 02/21/2018  GLUCOSE 97 02/21/2018   BUN 14 02/21/2018   CREATININE 0.97 02/21/2018   BILITOT 0.5 02/21/2018   ALKPHOS 57 02/15/2017   AST 20 02/21/2018   ALT 25 02/21/2018   PROT 6.8 02/21/2018   ALBUMIN 4.5 02/15/2017   CALCIUM 9.5 02/21/2018   Lab Results  Component Value Date   CHOL 233 (H) 02/21/2018   Lab Results  Component Value Date   HDL 40 (L) 02/21/2018   Lab Results  Component Value Date   LDLCALC 156 (H) 02/21/2018   Lab Results  Component Value Date   TRIG 221 (H) 02/21/2018   Lab Results  Component Value Date   CHOLHDL 5.8 (H) 02/21/2018   Lab Results  Component Value Date   HGBA1C 5.6 02/21/2018       Assessment & Plan:   Problem List Items Addressed This Visit    None    Visit Diagnoses    Mid back pain on left side    -  Primary   Relevant Medications   metaxalone (SKELAXIN) 400 MG tablet     Most consistent with muscle strain. See stretches from Handout.  Call if not better after one week.  Try the Naproxen ( 2 tabs up to twice a day) for inflammation and pain.    Meds ordered this encounter  Medications  . metaxalone (SKELAXIN) 400 MG tablet    Sig: Take 0.5-1 tablets (200-400 mg total) by mouth 2 (two) times daily as needed.    Dispense:  20 tablet    Refill:  0     Beatrice Lecher, MD

## 2018-10-10 NOTE — Patient Instructions (Addendum)
See stretches from Handout.  Call if not better after one week.  Try the Naproxen ( 2 tabs up to twice a day) for inflammation and pain.

## 2018-10-13 LAB — BASIC METABOLIC PANEL WITH GFR
BUN: 15 mg/dL (ref 7–25)
CO2: 29 mmol/L (ref 20–32)
Calcium: 9.9 mg/dL (ref 8.6–10.3)
Chloride: 102 mmol/L (ref 98–110)
Creat: 1.05 mg/dL (ref 0.60–1.35)
GFR, EST NON AFRICAN AMERICAN: 84 mL/min/{1.73_m2} (ref >=60)
GFR, Est African American: 97 mL/min/{1.73_m2} (ref >=60)
Glucose, Bld: 105 mg/dL — ABNORMAL HIGH (ref 65–99)
Potassium: 5.1 mmol/L (ref 3.5–5.3)
Sodium: 137 mmol/L (ref 135–146)

## 2018-10-13 LAB — TESTOSTERONE TOTAL,FREE,BIO, MALES
Albumin: 4.4 g/dL (ref 3.6–5.1)
SEX HORMONE BINDING: 12 nmol/L (ref 10–50)
Testosterone: 238 ng/dL — ABNORMAL LOW (ref 250–827)

## 2018-10-13 NOTE — Progress Notes (Signed)
All labs are normal. 

## 2018-11-05 ENCOUNTER — Encounter: Payer: Self-pay | Admitting: Family Medicine

## 2018-11-05 IMAGING — DX DG LUMBAR SPINE COMPLETE 4+V
5 series · 5 of 5 positions shown · non-contrast
Comparison: None.

CLINICAL DATA: Chronic intermittent low back pain for the past
year. No injury.

EXAM:
LUMBAR SPINE - COMPLETE 4+ VIEW

[l-spine ap]
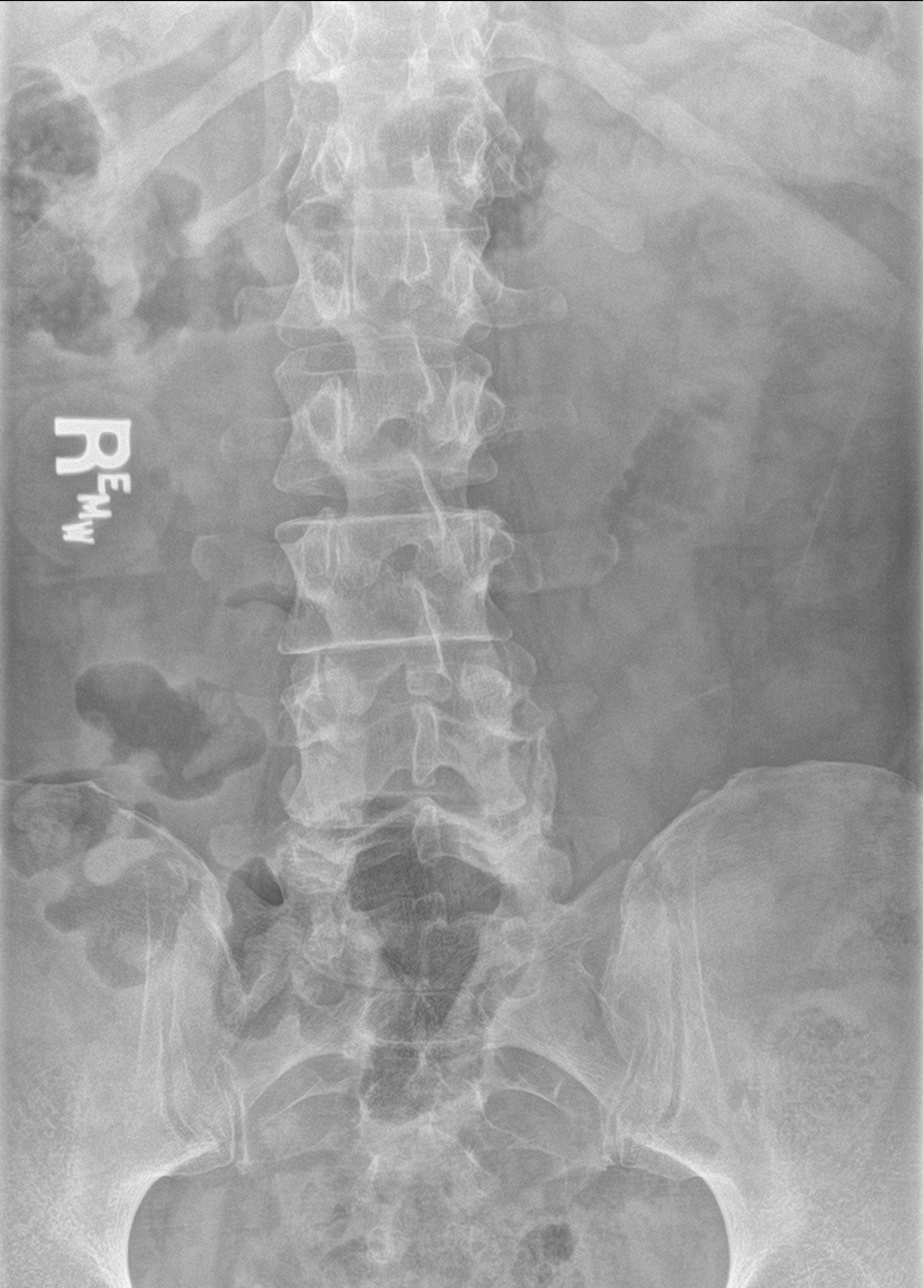

[l-spine obl (1 of 2)]
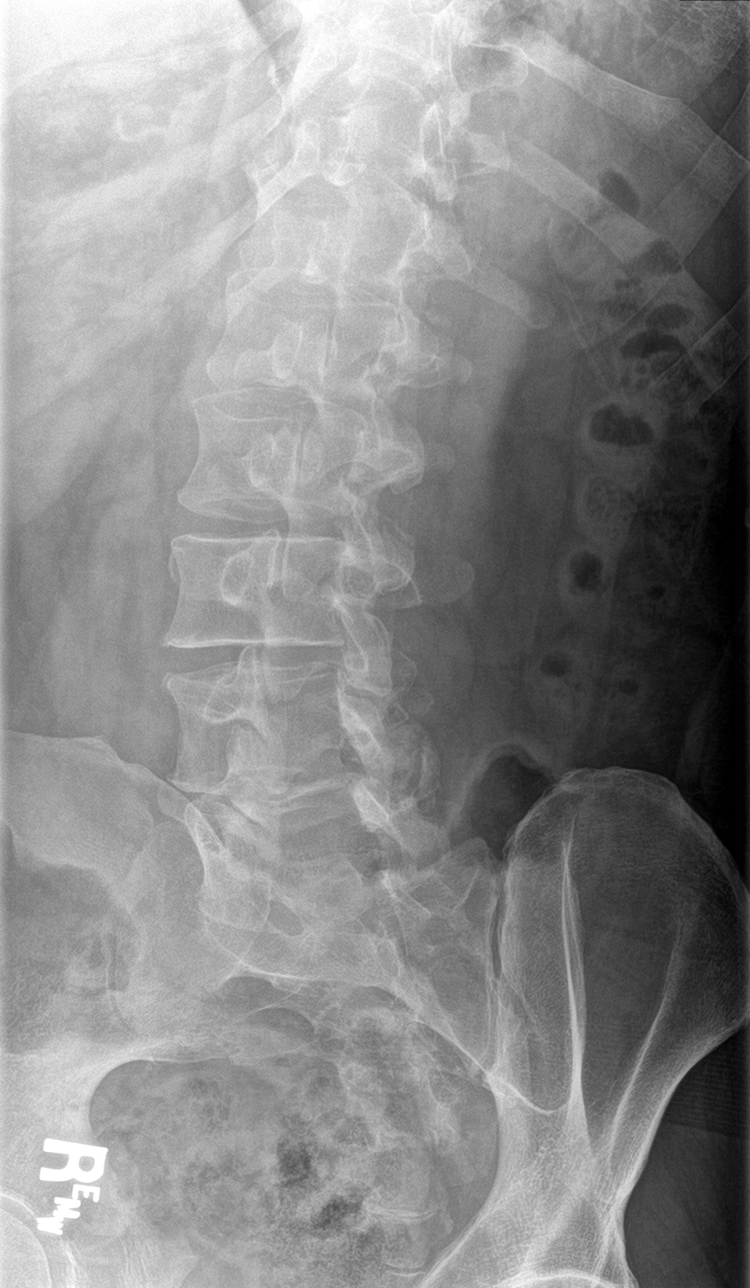

[l-spine obl (2 of 2)]
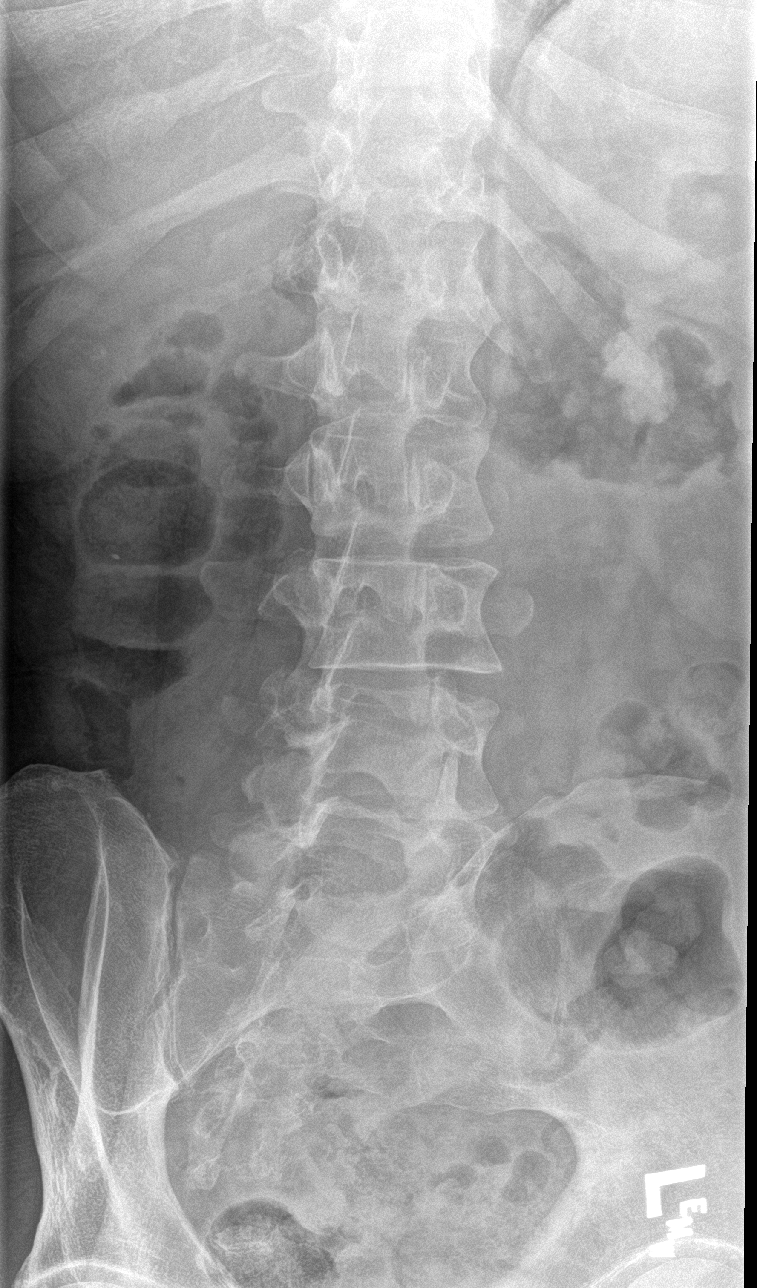

[l-spine lat]
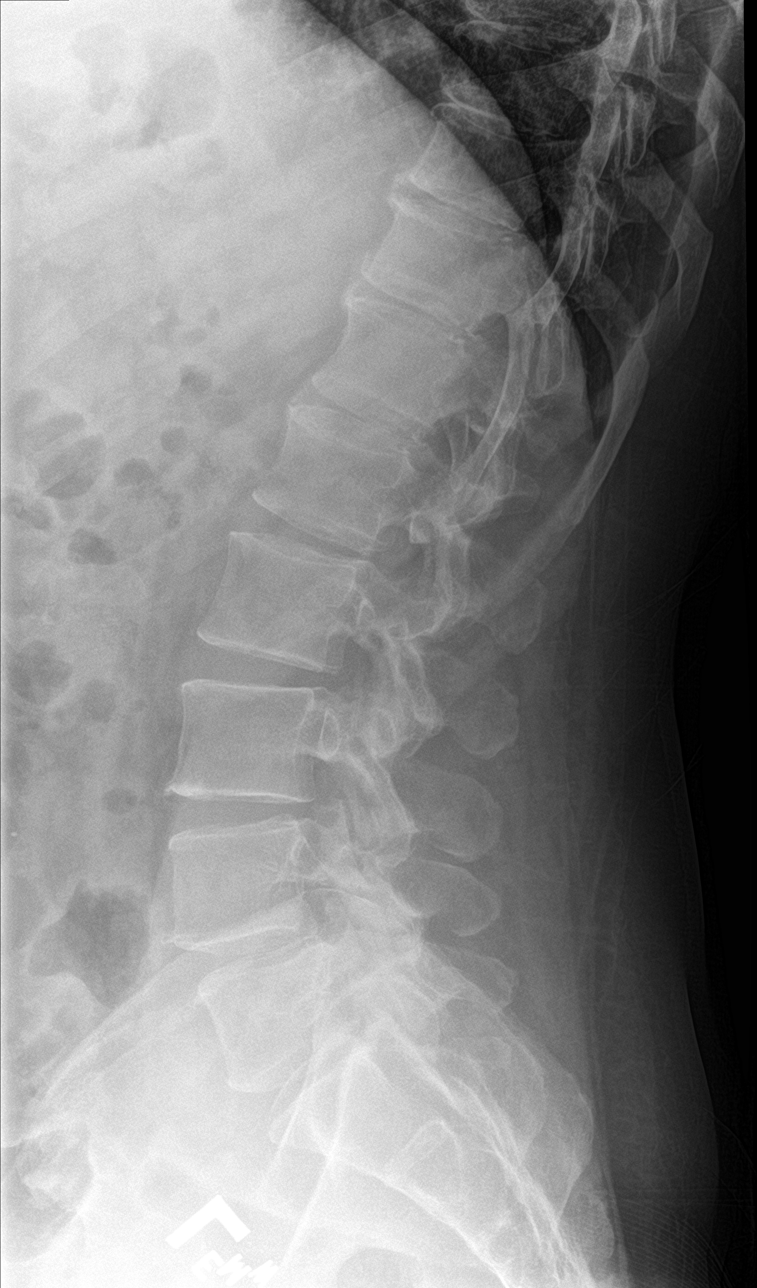

[l-spine spot]
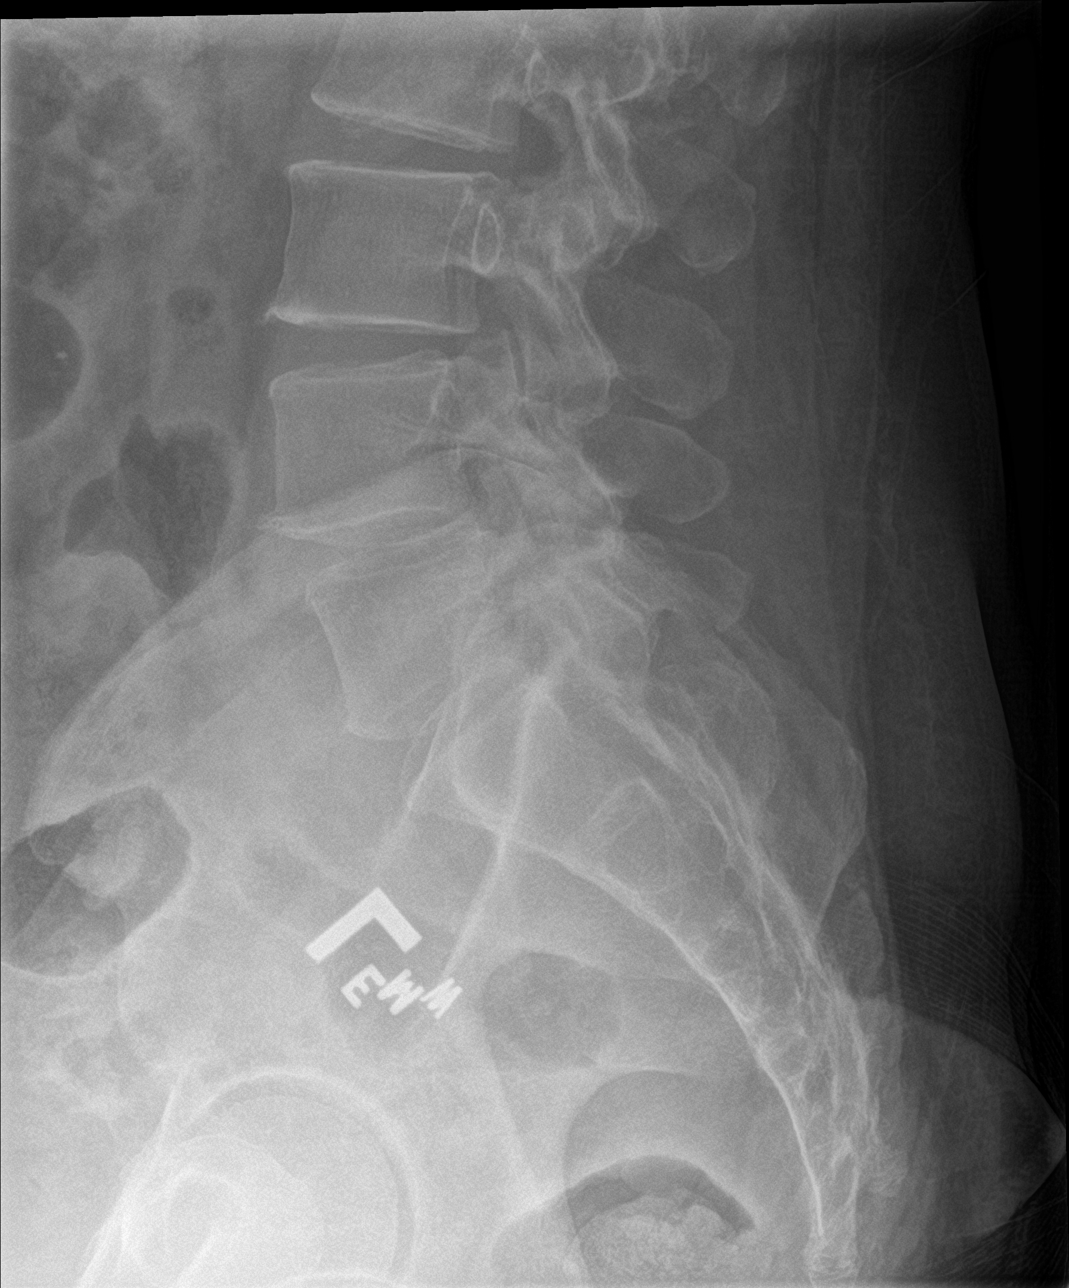

[5 of 5 positions shown; findings below may reference images not displayed]

FINDINGS: Five lumbar type vertebral bodies. No acute fracture or subluxation.
Vertebral body heights are preserved. Trace anterolisthesis at L4-L5
due to mild facet arthropathy. Mild disc height loss and endplate
spurring from L3-L4 through L5-S1. The sacroiliac joints are intact.
IMPRESSION: Mild degenerative changes of the lower lumbar spine.

## 2018-11-07 MED ORDER — TESTOSTERONE 10 MG/ACT (2%) TD GEL: TRANSDERMAL | 1 refills | Status: DC

## 2018-11-11 ENCOUNTER — Encounter: Payer: Self-pay | Admitting: Family Medicine

## 2018-11-11 NOTE — Telephone Encounter (Signed)
Received a fax from [facility] that Testosterone was approved 11/10/2018 through 11/10/2021. Pharmacy aware.

## 2018-11-12 ENCOUNTER — Encounter: Payer: Self-pay | Admitting: Family Medicine

## 2018-11-13 ENCOUNTER — Ambulatory Visit: Payer: Self-pay | Admitting: Family Medicine

## 2018-11-14 NOTE — Telephone Encounter (Signed)
Left VM for Pt to return clinic call.  

## 2019-01-01 ENCOUNTER — Encounter: Payer: Self-pay | Admitting: Family Medicine

## 2019-01-05 ENCOUNTER — Telehealth: Payer: Self-pay | Admitting: Family Medicine

## 2019-01-05 ENCOUNTER — Encounter: Payer: Self-pay | Admitting: Family Medicine

## 2019-01-05 ENCOUNTER — Other Ambulatory Visit: Payer: Self-pay

## 2019-01-05 ENCOUNTER — Ambulatory Visit (INDEPENDENT_AMBULATORY_CARE_PROVIDER_SITE_OTHER): Payer: BC Managed Care – PPO | Admitting: Family Medicine

## 2019-01-05 VITALS — BP 117/79 | HR 75 | Ht 72.0 in | Wt 259.0 lb

## 2019-01-05 DIAGNOSIS — R7989 Other specified abnormal findings of blood chemistry: Secondary | ICD-10-CM | POA: Diagnosis not present

## 2019-01-05 DIAGNOSIS — I10 Essential (primary) hypertension: Secondary | ICD-10-CM

## 2019-01-05 MED ORDER — TESTOSTERONE CYPIONATE 200 MG/ML IM SOLN: 200.0000 mg | INTRAMUSCULAR | 0 refills | Status: DC

## 2019-01-05 NOTE — Telephone Encounter (Signed)
Received a fax from [facility] that Testosterone was approved from 01/05/2019 through 01/04/2022. Pharmacy aware and form sent to scan.
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
## 2019-01-05 NOTE — Telephone Encounter (Signed)
Received fax from [employer] that Testosterone requires a PA. Information has been sent to the insurance company. Awaiting determination.   

## 2019-01-06 ENCOUNTER — Other Ambulatory Visit: Payer: Self-pay | Admitting: Family Medicine

## 2019-01-07 ENCOUNTER — Other Ambulatory Visit: Payer: Self-pay | Admitting: Family Medicine

## 2019-02-01 ENCOUNTER — Other Ambulatory Visit: Payer: Self-pay | Admitting: Family Medicine

## 2019-02-01 DIAGNOSIS — I10 Essential (primary) hypertension: Secondary | ICD-10-CM

## 2019-03-10 ENCOUNTER — Other Ambulatory Visit: Payer: Self-pay | Admitting: Family Medicine

## 2019-03-10 DIAGNOSIS — R7989 Other specified abnormal findings of blood chemistry: Secondary | ICD-10-CM

## 2019-03-10 NOTE — Telephone Encounter (Signed)
Please call patient and let him know that we did get a request for the testosterone refill.  We will send that over but he does need to go for blood work in the next week or 2.  We need to check a CBC and PSA as it is have to might monitor carefully.  I would like for him to go right before his next injection is due within a couple days if possible.

## 2019-03-10 NOTE — Telephone Encounter (Signed)
lvm advising pt of recommendations. Lab ordered.Stephen Orozco, [place]

## 2019-03-12 ENCOUNTER — Encounter: Payer: Self-pay | Admitting: Family Medicine

## 2019-03-24 ENCOUNTER — Encounter: Payer: Self-pay | Admitting: Family Medicine

## 2019-03-31 ENCOUNTER — Ambulatory Visit: Payer: BC Managed Care – PPO

## 2019-04-01 ENCOUNTER — Ambulatory Visit (INDEPENDENT_AMBULATORY_CARE_PROVIDER_SITE_OTHER): Payer: BC Managed Care – PPO | Admitting: Family Medicine

## 2019-04-01 DIAGNOSIS — R7989 Other specified abnormal findings of blood chemistry: Secondary | ICD-10-CM | POA: Diagnosis not present

## 2019-04-01 MED ORDER — TESTOSTERONE CYPIONATE 200 MG/ML IM SOLN
200.0000 mg | Freq: Once | INTRAMUSCULAR | Status: AC
  Administered 2019-04-01: 16:00:00 200 mg via INTRAMUSCULAR

## 2019-04-01 NOTE — Progress Notes (Signed)
Established Patient Office Visit  Subjective:  Patient ID: Stephen Orozco, male    DOB: May 15, 1971  Age: 48 y.o. MRN: [id number]  CC:  Chief Complaint  Patient presents with  . Hypogonadism    HPI Gar Glance presents for teaching on how to self inject testosterone.   Past Medical History:  Diagnosis Date  . Hyperlipidemia   . Hypertension     Past Surgical History:  Procedure Laterality Date  . APPENDECTOMY    . medial malleolar fixation Right    Ankle  . MENISCUS REPAIR Left 2010    Family History  Problem Relation Age of Onset  . Alcohol abuse Father   . Diabetes Father   . Hyperlipidemia Father   . Hypertension Father   . Gout Father   . Bipolar disorder Father   . Cancer Unknown   . Heart attack Unknown   . Depression Unknown   . Autism spectrum disorder Son   . ADD / ADHD Son     Social History   Socioeconomic History  . Marital status: Married    Spouse name: Not on file  . Number of children: 2  . Years of education: Not on file  . Highest education level: Not on file  Occupational History  . Occupation: unemployed.   Social Needs  . Financial resource strain: Not on file  . Food insecurity    Worry: Not on file    Inability: Not on file  . Transportation needs    Medical: Not on file    Non-medical: Not on file  Tobacco Use  . Smoking status: Former Smoker    Packs/day: 0.30    Years: 10.00    Pack years: 3.00    Types: Cigarettes    Quit date: 09/14/2012    Years since quitting: 6.5  . Smokeless tobacco: Never Used  Substance and Sexual Activity  . Alcohol use: Yes    Alcohol/week: 2.0 - 3.0 standard drinks    Types: 2 - 3 Standard drinks or equivalent per week    Comment: Occasional use  . Drug use: No  . Sexual activity: Yes    Partners: Female    Birth control/protection: None, Surgical  Lifestyle  . Physical activity    Days per week: Not on file    Minutes per session: Not on file  . Stress: Not on file   Relationships  . Social Herbalist on phone: Not on file    Gets together: Not on file    Attends religious service: Not on file    Active member of club or organization: Not on file    Attends meetings of clubs or organizations: Not on file    Relationship status: Not on file  . Intimate partner violence    Fear of current or ex partner: Not on file    Emotionally abused: Not on file    Physically abused: Not on file    Forced sexual activity: Not on file  Other Topics Concern  . Not on file  Social History Narrative   1 half caff drink daily. No regular exercise. Wife is a Marine scientist. He is unemployed right now.     Outpatient Medications Prior to Visit  Medication Sig Dispense Refill  . AMBULATORY NON FORMULARY MEDICATION Medication Name: *New start CPAP for OSA.  SEt to 12 cm water pressure. Large size Fisher&Paykel Full Face Mask Simplus mask and heated humidification. Please fax download after one week.  Fax to [facility]. 1 Units 0  . lisinopril (ZESTRIL) 10 MG tablet TAKE 1 TABLET BY MOUTH EVERY DAY 90 tablet 1  . metaxalone (SKELAXIN) 800 MG tablet TAKE 1/4 TO 1/2 TABLETS (200-400 MG TOTAL) BY MOUTH 2 (TWO) TIMES DAILY AS NEEDED. 10 tablet 0  . pravastatin (PRAVACHOL) 40 MG tablet TAKE 1 TABLET BY MOUTH EVERY DAY 90 tablet 3  . sertraline (ZOLOFT) 100 MG tablet Take 2 tablets (200 mg total) by mouth daily. 180 tablet 1  . testosterone cypionate (DEPOTESTOSTERONE CYPIONATE) 200 MG/ML injection Inject 1 mL (200 mg total) into the muscle every 14 (fourteen) days. 10 mL 0  . amphetamine-dextroamphetamine (ADDERALL) 10 MG tablet Take 1 tablet (10 mg total) by mouth 2 (two) times daily with a meal. 60 tablet 0  . amphetamine-dextroamphetamine (ADDERALL) 10 MG tablet Take 1 tablet (10 mg total) by mouth 2 (two) times daily with a meal. 60 tablet 0  . amphetamine-dextroamphetamine (ADDERALL) 10 MG tablet Take 1 tablet (10 mg total) by mouth 2 (two) times daily with a meal. 60  tablet 0  . Testosterone 10 MG/ACT (2%) GEL 1 pump onto each inner thigh ( total 2 pumps daily) (Patient not taking: Reported on 01/05/2019) 60 g 1   No facility-administered medications prior to visit.     Allergies  Allergen Reactions  . Flexeril [Cyclobenzaprine] Other (See Comments)    Excess sedation  . Adhesive [Tape] Rash    ROS Review of Systems    Objective:    Physical Exam  There were no vitals taken for this visit. Wt Readings from Last 3 Encounters:  01/05/19 259 lb (117.5 kg)  10/10/18 260 lb (117.9 kg)  10/02/18 258 lb (117 kg)     There are no preventive care reminders to display for this patient.  There are no preventive care reminders to display for this patient.  Lab Results  Component Value Date   TSH 1.73 02/21/2018   Lab Results  Component Value Date   WBC 6.0 02/21/2018   HGB 14.0 02/21/2018   HCT 40.7 02/21/2018   MCV 88.3 02/21/2018   PLT 169 02/21/2018   Lab Results  Component Value Date   NA 137 10/10/2018   K 5.1 10/10/2018   CO2 29 10/10/2018   GLUCOSE 105 (H) 10/10/2018   BUN 15 10/10/2018   CREATININE 1.05 10/10/2018   BILITOT 0.5 02/21/2018   ALKPHOS 57 02/15/2017   AST 20 02/21/2018   ALT 25 02/21/2018   PROT 6.8 02/21/2018   ALBUMIN 4.5 02/15/2017   CALCIUM 9.9 10/10/2018   Lab Results  Component Value Date   CHOL 233 (H) 02/21/2018   Lab Results  Component Value Date   HDL 40 (L) 02/21/2018   Lab Results  Component Value Date   LDLCALC 156 (H) 02/21/2018   Lab Results  Component Value Date   TRIG 221 (H) 02/21/2018   Lab Results  Component Value Date   CHOLHDL 5.8 (H) 02/21/2018   Lab Results  Component Value Date   HGBA1C 5.6 02/21/2018      Assessment & Plan:  Low Testosterone - Patient tolerated injection well without complications. Patient advised give himself another testosterone injection in 2 weeks.   Problem List Items Addressed This Visit    Low testosterone - Primary      Meds  ordered this encounter  Medications  . testosterone cypionate (DEPOTESTOSTERONE CYPIONATE) injection 200 mg    Follow-up: No follow-ups on file.    Durene Romans,  Monico Blitz, CMA

## 2019-04-02 NOTE — Progress Notes (Signed)
Agree with documentation as above.   Catherine Metheney, MD  

## 2019-04-14 ENCOUNTER — Encounter: Payer: Self-pay | Admitting: Family Medicine

## 2019-04-15 MED ORDER — "SYRINGE 18G X 1-1/2"" 3 ML MISC": 1 refills | Status: DC

## 2019-04-15 MED ORDER — "NEEDLE (DISP) 22G X 1-1/2"" MISC": 1 refills | Status: DC

## 2019-06-04 ENCOUNTER — Other Ambulatory Visit: Payer: Self-pay | Admitting: Family Medicine

## 2019-06-04 NOTE — Telephone Encounter (Signed)
Please call patient and inform him he has to go for his lab work before we can refill this medication.  He has not had blood work to monitor the testosterone in 8 months.  They were labs ordered in May which are still pending he should be able to go and still have those drawn without a new order being placed.

## 2019-06-04 NOTE — Telephone Encounter (Signed)
Called patient and LM on VM of MD instructions. Call back number provided if any questions. KG LPN

## 2019-06-08 NOTE — Telephone Encounter (Signed)
Please attempt to call pt again.

## 2019-06-09 NOTE — Telephone Encounter (Signed)
Attempted to call patient again and LM on VM of MD instructions. KG LPN

## 2019-06-09 NOTE — Telephone Encounter (Signed)
Called pt and lvm advising him that he will need to have his labs done prior to the medication being refilled and that he can go to the lab at his convenience to have this done.Marland Kitchenadvised that he can rtn call if any questions.Elouise Munroe, [place]

## 2019-06-11 ENCOUNTER — Other Ambulatory Visit: Payer: Self-pay | Admitting: Family Medicine

## 2019-06-11 DIAGNOSIS — E785 Hyperlipidemia, unspecified: Secondary | ICD-10-CM

## 2019-06-12 ENCOUNTER — Encounter: Payer: Self-pay | Admitting: Family Medicine

## 2019-06-12 MED ORDER — TESTOSTERONE CYPIONATE 200 MG/ML IM SOLN: 200.0000 mg | INTRAMUSCULAR | 0 refills | Status: DC

## 2019-06-13 LAB — CBC
HCT: 45.5 % (ref 38.5–50.0)
Hemoglobin: 15.4 g/dL (ref 13.2–17.1)
MCH: 31.8 pg (ref 27.0–33.0)
MCHC: 33.8 g/dL (ref 32.0–36.0)
MCV: 93.8 fL (ref 80.0–100.0)
MPV: 9.4 fL (ref 7.5–12.5)
Platelets: 156 10*3/uL (ref 140–400)
RBC: 4.85 10*6/uL (ref 4.20–5.80)
RDW: 14.8 % (ref 11.0–15.0)
WBC: 6.2 10*3/uL (ref 3.8–10.8)

## 2019-06-13 LAB — TESTOSTERONE: Testosterone: 139 ng/dL — ABNORMAL LOW (ref 250–827)

## 2019-06-13 LAB — PSA: PSA: 0.3 ng/mL (ref <=4.0)

## 2019-07-02 ENCOUNTER — Encounter: Payer: Self-pay | Admitting: Family Medicine

## 2019-07-02 ENCOUNTER — Other Ambulatory Visit: Payer: Self-pay

## 2019-07-02 ENCOUNTER — Ambulatory Visit (INDEPENDENT_AMBULATORY_CARE_PROVIDER_SITE_OTHER): Payer: BC Managed Care – PPO | Admitting: Family Medicine

## 2019-07-02 VITALS — BP 127/65 | HR 83 | Temp 99.0°F | Ht 72.0 in | Wt 258.0 lb

## 2019-07-02 DIAGNOSIS — E785 Hyperlipidemia, unspecified: Secondary | ICD-10-CM

## 2019-07-02 DIAGNOSIS — L7211 Pilar cyst: Secondary | ICD-10-CM

## 2019-07-02 DIAGNOSIS — R7989 Other specified abnormal findings of blood chemistry: Secondary | ICD-10-CM | POA: Diagnosis not present

## 2019-07-02 DIAGNOSIS — Z23 Encounter for immunization: Secondary | ICD-10-CM

## 2019-07-02 DIAGNOSIS — I10 Essential (primary) hypertension: Secondary | ICD-10-CM

## 2019-07-02 MED ORDER — TESTOSTERONE CYPIONATE 200 MG/ML IM SOLN: 250.0000 mg | INTRAMUSCULAR | 1 refills | Status: DC

## 2019-07-02 NOTE — Patient Instructions (Addendum)
Please schedule appoint with Dr. Aundria Mems in our office for cyst removal from the scalp at your convenience.  He is here Monday through Friday.  Regarding increase your testosterone to 250 mg every 2 weeks.  About 10 days after your third injection please go to the lab.  That would be near the end of October or early November depending on exactly when you time your injections.

## 2019-07-02 NOTE — Assessment & Plan Note (Signed)
Blood pressure looks fantastic today.  Due for labs will go in 6 weeks.  Otherwise follow-up blood pressure in 6 months.

## 2019-07-02 NOTE — Assessment & Plan Note (Signed)
Doing well on testosterone therapy though he felt better in the beginning.  And get a go ahead and increase his dose based on his last lab levels and then he will go again in about 6 weeks for repeat after adjustment to his dosing.  We will increase to 250 mg.

## 2019-07-02 NOTE — Assessment & Plan Note (Signed)
Due to recheck lipid levels.  Again we will go in 6 weeks.  Continue with pravastatin.

## 2019-07-02 NOTE — Progress Notes (Addendum)
Established Patient Office Visit  Subjective:  Patient ID: Stephen Orozco, male    DOB: 1971/02/15  Age: 48 y.o. MRN: [id number]  CC:  Chief Complaint  Patient presents with  . Back Pain  . pilar cyst    L side of his head    HPI Stephen Orozco presents for   Hypertension- Pt denies chest pain, SOB, dizziness, or heart palpitations.  Taking meds as directed w/o problems.  Denies medication side effects.    Hyperlipidemia - tolerating stating well with no myalgias or significant side effects.  Lab Results  Component Value Date   CHOL 233 (H) 02/21/2018   CHOL 208 (H) 02/15/2017   CHOL 189 12/16/2015   Lab Results  Component Value Date   HDL 40 (L) 02/21/2018   HDL 39 (L) 02/15/2017   HDL 37 (L) 12/16/2015   Lab Results  Component Value Date   LDLCALC 156 (H) 02/21/2018   LDLCALC 126 12/16/2015   LDLCALC 126 (H) 12/10/2014   Lab Results  Component Value Date   TRIG 221 (H) 02/21/2018   TRIG 172 (H) 02/15/2017   TRIG 128 12/16/2015   Lab Results  Component Value Date   CHOLHDL 5.8 (H) 02/21/2018   CHOLHDL 5.3 (H) 02/15/2017   CHOLHDL 5.1 (H) 12/16/2015   No results found for: LDLDIRECT   Hypogonadism-he says he felt best when he first started the testosterone supplement but now just seems to have waned a little bit.  His last testosterone level was 139 right before his next injection.   He has a cyst on the left side of his scalp he would like to be looked at today. Starting to bother him when he puts on a hat. Has had a similar cyst removed on his scalp before.  Had about 4 days last week were he felt really fatigued and almost SOB. Almost called to get COVID tested but then felt good and mowed the law.   Past Medical History:  Diagnosis Date  . Hyperlipidemia   . Hypertension     Past Surgical History:  Procedure Laterality Date  . APPENDECTOMY    . medial malleolar fixation Right    Ankle  . MENISCUS REPAIR Left 2010    Family History   Problem Relation Age of Onset  . Alcohol abuse Father   . Diabetes Father   . Hyperlipidemia Father   . Hypertension Father   . Gout Father   . Bipolar disorder Father   . Cancer Unknown   . Heart attack Unknown   . Depression Unknown   . Autism spectrum disorder Son   . ADD / ADHD Son     Social History   Socioeconomic History  . Marital status: Married    Spouse name: Not on file  . Number of children: 2  . Years of education: Not on file  . Highest education level: Not on file  Occupational History  . Occupation: unemployed.   Social Needs  . Financial resource strain: Not on file  . Food insecurity    Worry: Not on file    Inability: Not on file  . Transportation needs    Medical: Not on file    Non-medical: Not on file  Tobacco Use  . Smoking status: Former Smoker    Packs/day: 0.30    Years: 10.00    Pack years: 3.00    Types: Cigarettes    Quit date: 09/14/2012    Years since quitting: 6.8  .  Smokeless tobacco: Never Used  Substance and Sexual Activity  . Alcohol use: Yes    Alcohol/week: 2.0 - 3.0 standard drinks    Types: 2 - 3 Standard drinks or equivalent per week    Comment: Occasional use  . Drug use: No  . Sexual activity: Yes    Partners: Female    Birth control/protection: None, Surgical  Lifestyle  . Physical activity    Days per week: Not on file    Minutes per session: Not on file  . Stress: Not on file  Relationships  . Social Herbalist on phone: Not on file    Gets together: Not on file    Attends religious service: Not on file    Active member of club or organization: Not on file    Attends meetings of clubs or organizations: Not on file    Relationship status: Not on file  . Intimate partner violence    Fear of current or ex partner: Not on file    Emotionally abused: Not on file    Physically abused: Not on file    Forced sexual activity: Not on file  Other Topics Concern  . Not on file  Social History Narrative    1 half caff drink daily. No regular exercise. Wife is a Marine scientist. He is unemployed right now.     Outpatient Medications Prior to Visit  Medication Sig Dispense Refill  . AMBULATORY NON FORMULARY MEDICATION Medication Name: *New start CPAP for OSA.  SEt to 12 cm water pressure. Large size Fisher[employer]Paykel Full Face Mask Simplus mask and heated humidification. Please fax download after one week.  Fax to [facility]. 1 Units 0  . lisinopril (ZESTRIL) 10 MG tablet TAKE 1 TABLET BY MOUTH EVERY DAY 90 tablet 1  . NEEDLE, DISP, 22 G 22G X 1-1/2" MISC Inject Testosterone into muscle every 2 weeks. 10 each 1  . pravastatin (PRAVACHOL) 40 MG tablet TAKE 1 TABLET BY MOUTH EVERY DAY 90 tablet 3  . sildenafil (REVATIO) 20 MG tablet Take 3 tablets by mouth one hour prior to sex as needed. Do not take more than one dose every 24 hours.    . Syringe/Needle, Disp, (SYRINGE 3CC/18GX1-1/2") 18G X 1-1/2" 3 ML MISC Inject Testosterone into muscle every 2 weeks. 10 each 1  . amphetamine-dextroamphetamine (ADDERALL) 10 MG tablet Take 1 tablet (10 mg total) by mouth 2 (two) times daily with a meal. 60 tablet 0  . amphetamine-dextroamphetamine (ADDERALL) 10 MG tablet Take 1 tablet (10 mg total) by mouth 2 (two) times daily with a meal. 60 tablet 0  . amphetamine-dextroamphetamine (ADDERALL) 10 MG tablet Take 1 tablet (10 mg total) by mouth 2 (two) times daily with a meal. 60 tablet 0  . metaxalone (SKELAXIN) 800 MG tablet TAKE 1/4 TO 1/2 TABLETS (200-400 MG TOTAL) BY MOUTH 2 (TWO) TIMES DAILY AS NEEDED. 10 tablet 0  . sertraline (ZOLOFT) 100 MG tablet Take 2 tablets (200 mg total) by mouth daily. 180 tablet 1  . testosterone cypionate (DEPOTESTOSTERONE CYPIONATE) 200 MG/ML injection Inject 1 mL (200 mg total) into the muscle every 14 (fourteen) days. (Patient not taking: Reported on 07/02/2019) 10 mL 0   No facility-administered medications prior to visit.     Allergies  Allergen Reactions  . Flexeril [Cyclobenzaprine]  Other (See Comments)    Excess sedation  . Adhesive [Tape] Rash    ROS Review of Systems    Objective:    Physical Exam  Constitutional: He is oriented to person, place, and time. He appears well-developed and well-nourished.  HENT:  Head: Normocephalic and atraumatic.  Cardiovascular: Normal rate, regular rhythm and normal heart sounds.  Pulmonary/Chest: Effort normal and breath sounds normal.  Neurological: He is alert and oriented to person, place, and time.  Skin: Skin is warm and dry.  Psychiatric: He has a normal mood and affect. His behavior is normal.    BP 127/65   Pulse 83   Temp 99 F (37.2 C)   Ht 6' (1.829 m)   Wt 258 lb (117 kg)   SpO2 100%   BMI 34.99 kg/m  Wt Readings from Last 3 Encounters:  07/02/19 258 lb (117 kg)  01/05/19 259 lb (117.5 kg)  10/10/18 260 lb (117.9 kg)     There are no preventive care reminders to display for this patient.  There are no preventive care reminders to display for this patient.  Lab Results  Component Value Date   TSH 1.73 02/21/2018   Lab Results  Component Value Date   WBC 6.2 06/12/2019   HGB 15.4 06/12/2019   HCT 45.5 06/12/2019   MCV 93.8 06/12/2019   PLT 156 06/12/2019   Lab Results  Component Value Date   NA 137 10/10/2018   K 5.1 10/10/2018   CO2 29 10/10/2018   GLUCOSE 105 (H) 10/10/2018   BUN 15 10/10/2018   CREATININE 1.05 10/10/2018   BILITOT 0.5 02/21/2018   ALKPHOS 57 02/15/2017   AST 20 02/21/2018   ALT 25 02/21/2018   PROT 6.8 02/21/2018   ALBUMIN 4.5 02/15/2017   CALCIUM 9.9 10/10/2018   Lab Results  Component Value Date   CHOL 233 (H) 02/21/2018   Lab Results  Component Value Date   HDL 40 (L) 02/21/2018   Lab Results  Component Value Date   LDLCALC 156 (H) 02/21/2018   Lab Results  Component Value Date   TRIG 221 (H) 02/21/2018   Lab Results  Component Value Date   CHOLHDL 5.8 (H) 02/21/2018   Lab Results  Component Value Date   HGBA1C 5.6 02/21/2018       Assessment & Plan:   Problem List Items Addressed This Visit      Cardiovascular and Mediastinum   Essential hypertension, benign - Primary    Blood pressure looks fantastic today.  Due for labs will go in 6 weeks.  Otherwise follow-up blood pressure in 6 months.      Relevant Medications   sildenafil (REVATIO) 20 MG tablet   Other Relevant Orders   Testosterone Total,Free,Bio, Males   Lipid Panel w/reflex Direct LDL   COMPLETE METABOLIC PANEL WITH GFR     Other   Low testosterone    Doing well on testosterone therapy though he felt better in the beginning.  And get a go ahead and increase his dose based on his last lab levels and then he will go again in about 6 weeks for repeat after adjustment to his dosing.  We will increase to 250 mg.      Relevant Orders   Testosterone Total,Free,Bio, Males   Lipid Panel w/reflex Direct LDL   COMPLETE METABOLIC PANEL WITH GFR   Hyperlipidemia    Due to recheck lipid levels.  Again we will go in 6 weeks.  Continue with pravastatin.      Relevant Medications   sildenafil (REVATIO) 20 MG tablet   Other Relevant Orders   Testosterone Total,Free,Bio, Males   Lipid Panel w/reflex  Direct LDL   COMPLETE METABOLIC PANEL WITH GFR    Other Visit Diagnoses    Need for immunization against influenza       Relevant Orders   Flu Vaccine QUAD 36+ mos IM (Completed)   Pilar cyst         Pilar cyst - discussed removal. Will schedule with Dr. Darene Lamer for removal. He is starting job on Monday so may have to schedule 1-2 months.     Meds ordered this encounter  Medications  . testosterone cypionate (DEPOTESTOSTERONE CYPIONATE) 200 MG/ML injection    Sig: Inject 1.25 mLs (250 mg total) into the muscle every 14 (fourteen) days.    Dispense:  10 mL    Refill:  1    Follow-up: Return in about 6 months (around 12/30/2019) for Diabetes follow-up.    Beatrice Lecher, MD

## 2019-07-06 ENCOUNTER — Ambulatory Visit: Payer: BC Managed Care – PPO | Admitting: Family Medicine

## 2019-07-19 ENCOUNTER — Other Ambulatory Visit: Payer: Self-pay | Admitting: Family Medicine

## 2019-08-07 ENCOUNTER — Other Ambulatory Visit: Payer: Self-pay | Admitting: Family Medicine

## 2019-08-07 DIAGNOSIS — I10 Essential (primary) hypertension: Secondary | ICD-10-CM

## 2019-08-25 ENCOUNTER — Encounter: Payer: Self-pay | Admitting: Family Medicine

## 2019-08-31 ENCOUNTER — Ambulatory Visit (INDEPENDENT_AMBULATORY_CARE_PROVIDER_SITE_OTHER): Payer: BC Managed Care – PPO | Admitting: Family Medicine

## 2019-08-31 ENCOUNTER — Other Ambulatory Visit: Payer: Self-pay

## 2019-08-31 DIAGNOSIS — Z23 Encounter for immunization: Secondary | ICD-10-CM

## 2019-08-31 NOTE — Progress Notes (Signed)
Pt in office today for Tdap vaccine.  Injection was given in left deltoid. Pt tolerated injection, without any immediate reactions.

## 2019-08-31 NOTE — Progress Notes (Signed)
Agree with documentation as above.   Catherine Metheney, MD  

## 2019-09-07 ENCOUNTER — Other Ambulatory Visit: Payer: Self-pay | Admitting: Family Medicine

## 2019-09-07 DIAGNOSIS — I10 Essential (primary) hypertension: Secondary | ICD-10-CM

## 2019-10-08 ENCOUNTER — Other Ambulatory Visit: Payer: Self-pay | Admitting: Family Medicine

## 2019-10-08 DIAGNOSIS — I10 Essential (primary) hypertension: Secondary | ICD-10-CM

## 2019-10-25 ENCOUNTER — Encounter: Payer: Self-pay | Admitting: Family Medicine

## 2019-10-26 ENCOUNTER — Ambulatory Visit (INDEPENDENT_AMBULATORY_CARE_PROVIDER_SITE_OTHER): Payer: BC Managed Care – PPO | Admitting: Physician Assistant

## 2019-10-26 ENCOUNTER — Encounter: Payer: Self-pay | Admitting: Physician Assistant

## 2019-10-26 VITALS — Temp 99.1°F | Ht 72.0 in | Wt 258.0 lb

## 2019-10-26 DIAGNOSIS — Z209 Contact with and (suspected) exposure to unspecified communicable disease: Secondary | ICD-10-CM

## 2019-10-26 DIAGNOSIS — R197 Diarrhea, unspecified: Secondary | ICD-10-CM | POA: Diagnosis not present

## 2019-10-26 DIAGNOSIS — R0981 Nasal congestion: Secondary | ICD-10-CM

## 2019-10-26 DIAGNOSIS — J029 Acute pharyngitis, unspecified: Secondary | ICD-10-CM

## 2019-10-26 DIAGNOSIS — R509 Fever, unspecified: Secondary | ICD-10-CM

## 2019-10-26 NOTE — Progress Notes (Deleted)
Started this weekend: Fever (100.8 - highest) Cough Diarrhea Sore throat Congestion (has allergies normally) nausea  No change in taste/smell, no vomiting  Has not taking anything OTC for symptoms

## 2019-10-26 NOTE — Progress Notes (Addendum)
Patient ID: Stephen Orozco, male   DOB: [birth year], 49 y.o.   MRN: [id number] .Marland KitchenVirtual Visit via Video Note  I connected with Radford Rivera on 10/26/2019 at  2:40 PM EST by a video enabled telemedicine application and verified that I am speaking with the correct person using two identifiers.  Location: Patient: home Provider: clinic   I discussed the limitations of evaluation and management by telemedicine and the availability of in person appointments. The patient expressed understanding and agreed to proceed.  History of Present Illness: Patient is a 49 year old male with hypertension and OSA who calls into the clinic with concerning symptoms for COVID-19.  His symptoms started this weekend with fever, fatigue, sore throat, nasal congestion, diarrhea, nausea.  He does work in [employer] and exposed to many people.  He has no known direct Covid contact.  He denies any change in taste or smell.  He denies any vomiting, shortness of breath.  He has a dry irritating cough but nothing substantial.  He has been not been taking anything to feel better.   .. Active Ambulatory Problems    Diagnosis Date Noted  . Anxiety 02/26/2012  . Essential hypertension, benign 02/26/2012  . Obesity, Class II, BMI 35-39.9, with comorbidity 04/22/2012  . Hyperlipidemia 11/20/2013  . MDD (major depressive disorder) 02/13/2018  . OSA (obstructive sleep apnea) 03/17/2018  . History of DVT (deep vein thrombosis) 03/17/2018  . Low testosterone 10/02/2018   Resolved Ambulatory Problems    Diagnosis Date Noted  . Other and unspecified hyperlipidemia 04/22/2012  . Tobacco abuse 11/20/2013   Past Medical History:  Diagnosis Date  . Hypertension    Reviewed med, allergy, problem list.   Observations/Objective: No acute distress. Normal breathing and respirations.  Normal mood.  No coughing noted.   .. Today's Vitals   10/26/19 1419  Temp: 99.1 F (37.3 C)  TempSrc: Oral  Weight: 258 lb (117 kg)   Height: 6' (1.829 m)   Body mass index is 34.99 kg/m.    Assessment and Plan: Marland KitchenMarland KitchenDenzil was seen today for diarrhea, fever and cough.  Diagnoses and all orders for this visit:  Contact with or exposure to communicable disease -     Novel Coronavirus, NAA ([facility])  Fever, unspecified fever cause  Diarrhea, unspecified type  Head congestion  Sore throat   Encourage patient to get Covid tested and stay out of work until results are in.  Symptomatic care discussed.  Encouraged rest, hydration, vitamin C, zinc.  Call office back with any sudden shortness of breath or worsening of symptoms.  Go to ED or urgent care with any urgent concerns.  Keep to a brat diet for diarrhea.  Spent 15 minutes with patient and with chart review.    Follow Up Instructions:    I discussed the assessment and treatment plan with the patient. The patient was provided an opportunity to ask questions and all were answered. The patient agreed with the plan and demonstrated an understanding of the instructions.   The patient was advised to call back or seek an in-person evaluation if the symptoms worsen or if the condition fails to improve as anticipated.    Iran Planas, PA-C

## 2019-10-27 ENCOUNTER — Encounter: Payer: Self-pay | Admitting: Physician Assistant

## 2019-10-27 LAB — NOVEL CORONAVIRUS, NAA: SARS-CoV-2, NAA: NOT DETECTED

## 2019-10-27 NOTE — Progress Notes (Signed)
Negative COVID. How are you feeling?

## 2019-10-28 MED ORDER — AZITHROMYCIN 250 MG PO TABS: ORAL_TABLET | ORAL | 0 refills | Status: DC

## 2019-10-28 MED ORDER — ALBUTEROL SULFATE HFA 108 (90 BASE) MCG/ACT IN AERS: 2.0000 | INHALATION_SPRAY | Freq: Four times a day (QID) | RESPIRATORY_TRACT | 0 refills | Status: DC | PRN

## 2019-10-28 NOTE — Telephone Encounter (Signed)
Work note to go back Friday. Covid negative.

## 2019-10-28 NOTE — Telephone Encounter (Signed)
Note written

## 2019-10-30 LAB — LIPID PANEL W/REFLEX DIRECT LDL
Cholesterol: 246 mg/dL — ABNORMAL HIGH (ref <200)
HDL: 44 mg/dL (ref >=40)
LDL Cholesterol (Calc): 147 mg/dL (calc) — ABNORMAL HIGH
Non-HDL Cholesterol (Calc): 202 mg/dL (calc) — ABNORMAL HIGH (ref <130)
Total CHOL/HDL Ratio: 5.6 (calc) — ABNORMAL HIGH (ref <5.0)
Triglycerides: 360 mg/dL — ABNORMAL HIGH (ref <150)

## 2019-10-30 LAB — COMPLETE METABOLIC PANEL WITH GFR
AG Ratio: 1.9 (calc) (ref 1.0–2.5)
ALT: 26 U/L (ref 9–46)
AST: 20 U/L (ref 10–40)
Albumin: 4.5 g/dL (ref 3.6–5.1)
Alkaline phosphatase (APISO): 56 U/L (ref 36–130)
BUN: 16 mg/dL (ref 7–25)
CO2: 28 mmol/L (ref 20–32)
Calcium: 9.6 mg/dL (ref 8.6–10.3)
Chloride: 100 mmol/L (ref 98–110)
Creat: 1.14 mg/dL (ref 0.60–1.35)
GFR, Est African American: 88 mL/min/{1.73_m2} (ref >=60)
GFR, Est Non African American: 76 mL/min/{1.73_m2} (ref >=60)
Globulin: 2.4 g/dL (calc) (ref 1.9–3.7)
Glucose, Bld: 100 mg/dL — ABNORMAL HIGH (ref 65–99)
Potassium: 4 mmol/L (ref 3.5–5.3)
Sodium: 137 mmol/L (ref 135–146)
Total Bilirubin: 0.4 mg/dL (ref 0.2–1.2)
Total Protein: 6.9 g/dL (ref 6.1–8.1)

## 2019-10-30 NOTE — Telephone Encounter (Signed)
Letter re-written and faxed to number provided with confirmation received.

## 2019-11-04 ENCOUNTER — Other Ambulatory Visit: Payer: Self-pay | Admitting: Family Medicine

## 2019-11-04 DIAGNOSIS — I10 Essential (primary) hypertension: Secondary | ICD-10-CM

## 2019-11-19 ENCOUNTER — Other Ambulatory Visit: Payer: Self-pay | Admitting: Family Medicine

## 2019-11-19 ENCOUNTER — Encounter: Payer: Self-pay | Admitting: Family Medicine

## 2019-11-19 DIAGNOSIS — I10 Essential (primary) hypertension: Secondary | ICD-10-CM

## 2019-11-20 ENCOUNTER — Other Ambulatory Visit: Payer: Self-pay | Admitting: *Deleted

## 2019-11-20 DIAGNOSIS — I10 Essential (primary) hypertension: Secondary | ICD-10-CM

## 2019-11-20 MED ORDER — LISINOPRIL 10 MG PO TABS: 10.0000 mg | ORAL_TABLET | Freq: Every day | ORAL | 1 refills | Status: DC

## 2020-01-06 ENCOUNTER — Encounter: Payer: Self-pay | Admitting: Family Medicine

## 2020-05-26 ENCOUNTER — Other Ambulatory Visit: Payer: Self-pay | Admitting: Family Medicine

## 2020-05-26 DIAGNOSIS — I10 Essential (primary) hypertension: Secondary | ICD-10-CM

## 2020-06-21 ENCOUNTER — Telehealth: Payer: BC Managed Care – PPO | Admitting: Family Medicine

## 2020-08-20 ENCOUNTER — Other Ambulatory Visit: Payer: Self-pay | Admitting: Family Medicine

## 2020-08-20 DIAGNOSIS — E785 Hyperlipidemia, unspecified: Secondary | ICD-10-CM

## 2020-09-01 ENCOUNTER — Encounter: Payer: Self-pay | Admitting: Family Medicine

## 2020-09-01 ENCOUNTER — Other Ambulatory Visit: Payer: Self-pay

## 2020-09-01 ENCOUNTER — Ambulatory Visit (INDEPENDENT_AMBULATORY_CARE_PROVIDER_SITE_OTHER): Payer: BC Managed Care – PPO | Admitting: Family Medicine

## 2020-09-01 VITALS — BP 123/70 | HR 90 | Ht 72.0 in | Wt 267.0 lb

## 2020-09-01 DIAGNOSIS — Z1159 Encounter for screening for other viral diseases: Secondary | ICD-10-CM | POA: Diagnosis not present

## 2020-09-01 DIAGNOSIS — Z0000 Encounter for general adult medical examination without abnormal findings: Secondary | ICD-10-CM

## 2020-09-01 DIAGNOSIS — Z1211 Encounter for screening for malignant neoplasm of colon: Secondary | ICD-10-CM

## 2020-09-01 DIAGNOSIS — I10 Essential (primary) hypertension: Secondary | ICD-10-CM

## 2020-09-01 DIAGNOSIS — Z125 Encounter for screening for malignant neoplasm of prostate: Secondary | ICD-10-CM | POA: Diagnosis not present

## 2020-09-01 DIAGNOSIS — M549 Dorsalgia, unspecified: Secondary | ICD-10-CM

## 2020-09-01 MED ORDER — LISINOPRIL 10 MG PO TABS: 10.0000 mg | ORAL_TABLET | Freq: Every day | ORAL | 1 refills | Status: DC

## 2020-09-01 MED ORDER — METAXALONE 400 MG PO TABS: ORAL_TABLET | ORAL | 1 refills | Status: DC

## 2020-09-01 NOTE — Patient Instructions (Signed)

## 2020-09-01 NOTE — Progress Notes (Signed)
CPE  Established Patient Office Visit  Subjective:  Patient ID: Stephen Orozco, male    DOB: 01/18/1971  Age: 49 y.o. MRN: [id number]  CC:  Chief Complaint  Patient presents with  . Annual Exam    HPI Stephen Orozco presents for CPE.  He is doing well overall.  He recently started a new job.  He has been trying to stay active he enjoys walking and hiking but says he is not very consistent.  He is still wearing his CPAP at least 6 to 8 h a night routinely.  Would like a refill on his muscle relaxer to use as needed for his back.  Past Medical History:  Diagnosis Date  . Hyperlipidemia   . Hypertension     Past Surgical History:  Procedure Laterality Date  . APPENDECTOMY    . medial malleolar fixation Right    Ankle  . MENISCUS REPAIR Left 2010    Family History  Problem Relation Age of Onset  . Alcohol abuse Father   . Diabetes Father   . Hyperlipidemia Father   . Hypertension Father   . Gout Father   . Bipolar disorder Father   . Cancer Unknown   . Heart attack Unknown   . Depression Unknown   . Autism spectrum disorder Son   . ADD / ADHD Son     Social History   Socioeconomic History  . Marital status: Married    Spouse name: Not on file  . Number of children: 2  . Years of education: Not on file  . Highest education level: Not on file  Occupational History  . Occupation: unemployed.   Tobacco Use  . Smoking status: Former Smoker    Packs/day: 0.30    Years: 10.00    Pack years: 3.00    Types: Cigarettes    Quit date: 09/14/2012    Years since quitting: 7.9  . Smokeless tobacco: Never Used  Substance and Sexual Activity  . Alcohol use: Yes    Alcohol/week: 2.0 - 3.0 standard drinks    Types: 2 - 3 Standard drinks or equivalent per week    Comment: Occasional use  . Drug use: No  . Sexual activity: Yes    Partners: Female    Birth control/protection: None, Surgical  Other Topics Concern  . Not on file  Social History Narrative   1 half caff  drink daily. No regular exercise. Wife is a Marine scientist. He is unemployed right now.    Social Determinants of Health   Financial Resource Strain:   . Difficulty of Paying Living Expenses: Not on file  Food Insecurity:   . Worried About Charity fundraiser in the Last Year: Not on file  . Ran Out of Food in the Last Year: Not on file  Transportation Needs:   . Lack of Transportation (Medical): Not on file  . Lack of Transportation (Non-Medical): Not on file  Physical Activity:   . Days of Exercise per Week: Not on file  . Minutes of Exercise per Session: Not on file  Stress:   . Feeling of Stress : Not on file  Social Connections:   . Frequency of Communication with Friends and Family: Not on file  . Frequency of Social Gatherings with Friends and Family: Not on file  . Attends Religious Services: Not on file  . Active Member of Clubs or Organizations: Not on file  . Attends Archivist Meetings: Not on file  . Marital Status:  Not on file  Intimate Partner Violence:   . Fear of Current or Ex-Partner: Not on file  . Emotionally Abused: Not on file  . Physically Abused: Not on file  . Sexually Abused: Not on file    Outpatient Medications Prior to Visit  Medication Sig Dispense Refill  . AMBULATORY NON FORMULARY MEDICATION Medication Name: *New start CPAP for OSA.  SEt to 12 cm water pressure. Large size Fisher[employer]Paykel Full Face Mask Simplus mask and heated humidification. Please fax download after one week.  Fax to [employer]. 1 Units 0  . amphetamine-dextroamphetamine (ADDERALL) 10 MG tablet Take 10 mg by mouth 2 (two) times daily.    . pravastatin (PRAVACHOL) 40 MG tablet TAKE 1 TABLET BY MOUTH EVERY DAY 90 tablet 3  . sertraline (ZOLOFT) 100 MG tablet Take 100 mg by mouth 2 (two) times daily.    Marland Kitchen lisinopril (ZESTRIL) 10 MG tablet TAKE 1 TABLET BY MOUTH EVERY DAY 90 tablet 1  . metaxalone (SKELAXIN) 800 MG tablet TAKE 1/4 TO 1/2 TABLETS (200-400 MG TOTAL) BY MOUTH 2 (TWO)  TIMES DAILY AS NEEDED. 10 tablet 0  . albuterol (VENTOLIN HFA) 108 (90 Base) MCG/ACT inhaler Inhale 2 puffs into the lungs every 6 (six) hours as needed. 18 g 0  . NEEDLE, DISP, 22 G 22G X 1-1/2" MISC Inject Testosterone into muscle every 2 weeks. 10 each 1  . Syringe/Needle, Disp, (SYRINGE 3CC/18GX1-1/2") 18G X 1-1/2" 3 ML MISC Inject Testosterone into muscle every 2 weeks. 10 each 1  . testosterone cypionate (DEPOTESTOSTERONE CYPIONATE) 200 MG/ML injection Inject 1.25 mLs (250 mg total) into the muscle every 14 (fourteen) days. 10 mL 1   No facility-administered medications prior to visit.    Allergies  Allergen Reactions  . Flexeril [Cyclobenzaprine] Other (See Comments)    Excess sedation  . Adhesive [Tape] Rash    ROS Review of Systems    Objective:    Physical Exam Constitutional:      Appearance: He is well-developed.  HENT:     Head: Normocephalic and atraumatic.     Right Ear: External ear normal.     Left Ear: External ear normal.     Nose: Nose normal.  Eyes:     Conjunctiva/sclera: Conjunctivae normal.     Pupils: Pupils are equal, round, and reactive to light.  Neck:     Thyroid: No thyromegaly.  Cardiovascular:     Rate and Rhythm: Normal rate and regular rhythm.     Heart sounds: Normal heart sounds.  Pulmonary:     Effort: Pulmonary effort is normal.     Breath sounds: Normal breath sounds.  Abdominal:     General: Bowel sounds are normal. There is no distension.     Palpations: Abdomen is soft. There is no mass.     Tenderness: There is no abdominal tenderness. There is no guarding or rebound.  Musculoskeletal:        General: Normal range of motion.     Cervical back: Normal range of motion and neck supple.  Lymphadenopathy:     Cervical: No cervical adenopathy.  Skin:    General: Skin is warm and dry.  Neurological:     Mental Status: He is alert and oriented to person, place, and time.     Deep Tendon Reflexes: Reflexes are normal and  symmetric.  Psychiatric:        Behavior: Behavior normal.        Thought Content: Thought content normal.  Judgment: Judgment normal.     BP 123/70   Pulse 90   Ht 6' (1.829 m)   Wt 267 lb (121.1 kg)   SpO2 100%   BMI 36.21 kg/m  Wt Readings from Last 3 Encounters:  09/01/20 267 lb (121.1 kg)  10/26/19 258 lb (117 kg)  07/02/19 258 lb (117 kg)     Health Maintenance Due  Topic Date Due  . Hepatitis C Screening  Never done    There are no preventive care reminders to display for this patient.  Lab Results  Component Value Date   TSH 1.73 02/21/2018   Lab Results  Component Value Date   WBC 6.2 06/12/2019   HGB 15.4 06/12/2019   HCT 45.5 06/12/2019   MCV 93.8 06/12/2019   PLT 156 06/12/2019   Lab Results  Component Value Date   NA 137 10/29/2019   K 4.0 10/29/2019   CO2 28 10/29/2019   GLUCOSE 100 (H) 10/29/2019   BUN 16 10/29/2019   CREATININE 1.14 10/29/2019   BILITOT 0.4 10/29/2019   ALKPHOS 57 02/15/2017   AST 20 10/29/2019   ALT 26 10/29/2019   PROT 6.9 10/29/2019   ALBUMIN 4.5 02/15/2017   CALCIUM 9.6 10/29/2019   Lab Results  Component Value Date   CHOL 246 (H) 10/29/2019   Lab Results  Component Value Date   HDL 44 10/29/2019   Lab Results  Component Value Date   LDLCALC 147 (H) 10/29/2019   Lab Results  Component Value Date   TRIG 360 (H) 10/29/2019   Lab Results  Component Value Date   CHOLHDL 5.6 (H) 10/29/2019   Lab Results  Component Value Date   HGBA1C 5.6 02/21/2018      Assessment & Plan:   Problem List Items Addressed This Visit      Cardiovascular and Mediastinum   Essential hypertension, benign   Relevant Medications   lisinopril (ZESTRIL) 10 MG tablet    Other Visit Diagnoses    Routine general medical examination at a health care facility    -  Primary   Relevant Orders   Hepatitis C antibody   Lipid Panel w/reflex Direct LDL   COMPLETE METABOLIC PANEL WITH GFR   CBC   Screening for malignant  neoplasm of colon       Relevant Orders   Ambulatory referral to Gastroenterology   Encounter for hepatitis C screening test for low risk patient       Relevant Orders   Hepatitis C antibody   Screening PSA (prostate specific antigen)       Mid back pain on left side       Relevant Medications   metaxalone (SKELAXIN) 400 MG tablet     Keep up a regular exercise program and make sure you are eating a healthy diet Try to eat 4 servings of dairy a day, or if you are lactose intolerant take a calcium with vitamin D daily.  Your vaccines are up to date.  Continue to work on regular exercise. Discussed options for colon cancer screening he is most interested in a colonoscopy.  We will go ahead and place referral today can call if he has any problems or concerns or they do not get in touch with him. For updated labs. Did refill muscle relaxer today.  He also let me know that his psychiatrist will be changing to doing televisits and no longer working out of his own office so they can be transferring his Adderall  prescription to [name].  He will make sure to get the records sent over so that we can pick up where they left off.  He says he was being followed every 6 months.  Meds ordered this encounter  Medications  . metaxalone (SKELAXIN) 400 MG tablet    Sig: TAKE 1 to 1/2 TABLETS (200-400 MG TOTAL) BY MOUTH 2 (TWO) TIMES DAILY AS NEEDED For Back Pain.    Dispense:  30 tablet    Refill:  1  . lisinopril (ZESTRIL) 10 MG tablet    Sig: Take 1 tablet (10 mg total) by mouth daily.    Dispense:  90 tablet    Refill:  1    Follow-up: Return in about 6 months (around 03/01/2021) for ADD.    Beatrice Lecher, MD

## 2020-09-09 LAB — CBC
HCT: 42.7 % (ref 38.5–50.0)
Hemoglobin: 14.9 g/dL (ref 13.2–17.1)
MCH: 32 pg (ref 27.0–33.0)
MCHC: 34.9 g/dL (ref 32.0–36.0)
MCV: 91.8 fL (ref 80.0–100.0)
MPV: 9.6 fL (ref 7.5–12.5)
Platelets: 148 10*3/uL (ref 140–400)
RBC: 4.65 10*6/uL (ref 4.20–5.80)
RDW: 13 % (ref 11.0–15.0)
WBC: 5.8 10*3/uL (ref 3.8–10.8)

## 2020-09-09 LAB — LIPID PANEL W/REFLEX DIRECT LDL
Cholesterol: 217 mg/dL — ABNORMAL HIGH (ref <200)
HDL: 38 mg/dL — ABNORMAL LOW (ref >=40)
LDL Cholesterol (Calc): 133 mg/dL (calc) — ABNORMAL HIGH
Non-HDL Cholesterol (Calc): 179 mg/dL (calc) — ABNORMAL HIGH (ref <130)
Total CHOL/HDL Ratio: 5.7 (calc) — ABNORMAL HIGH (ref <5.0)
Triglycerides: 323 mg/dL — ABNORMAL HIGH (ref <150)

## 2020-09-09 LAB — COMPLETE METABOLIC PANEL WITH GFR
AG Ratio: 1.9 (calc) (ref 1.0–2.5)
ALT: 38 U/L (ref 9–46)
AST: 26 U/L (ref 10–40)
Albumin: 4.5 g/dL (ref 3.6–5.1)
Alkaline phosphatase (APISO): 59 U/L (ref 36–130)
BUN: 12 mg/dL (ref 7–25)
CO2: 27 mmol/L (ref 20–32)
Calcium: 9.3 mg/dL (ref 8.6–10.3)
Chloride: 97 mmol/L — ABNORMAL LOW (ref 98–110)
Creat: 0.97 mg/dL (ref 0.60–1.35)
GFR, Est African American: 106 mL/min/{1.73_m2} (ref >=60)
GFR, Est Non African American: 91 mL/min/{1.73_m2} (ref >=60)
Globulin: 2.4 g/dL (calc) (ref 1.9–3.7)
Glucose, Bld: 107 mg/dL — ABNORMAL HIGH (ref 65–99)
Potassium: 4.4 mmol/L (ref 3.5–5.3)
Sodium: 133 mmol/L — ABNORMAL LOW (ref 135–146)
Total Bilirubin: 0.6 mg/dL (ref 0.2–1.2)
Total Protein: 6.9 g/dL (ref 6.1–8.1)

## 2020-09-09 LAB — HEPATITIS C ANTIBODY
Hepatitis C Ab: NONREACTIVE
SIGNAL TO CUT-OFF: 0.01 (ref <1.00)

## 2020-10-11 ENCOUNTER — Telehealth: Payer: Self-pay | Admitting: Family Medicine

## 2020-10-11 NOTE — Telephone Encounter (Signed)
Needs nurse visit for TB skin test to have form completed.

## 2020-10-11 NOTE — Telephone Encounter (Signed)
Patient dropped off form to be filled out on 10/11/2020, form placed in provider box. AM

## 2020-10-12 NOTE — Telephone Encounter (Signed)
LVM for patient to call back to schedule the TB skin test  & read. AM

## 2020-10-17 ENCOUNTER — Other Ambulatory Visit: Payer: Self-pay | Admitting: *Deleted

## 2020-10-17 ENCOUNTER — Ambulatory Visit: Payer: BC Managed Care – PPO

## 2020-10-17 DIAGNOSIS — Z20822 Contact with and (suspected) exposure to covid-19: Secondary | ICD-10-CM

## 2020-10-17 LAB — POCT INFLUENZA A/B
Influenza A, POC: NEGATIVE
Influenza B, POC: NEGATIVE

## 2020-10-18 ENCOUNTER — Encounter: Payer: Self-pay | Admitting: Gastroenterology

## 2020-10-19 ENCOUNTER — Ambulatory Visit: Payer: BC Managed Care – PPO

## 2020-10-19 LAB — NOVEL CORONAVIRUS, NAA: SARS-CoV-2, NAA: NOT DETECTED

## 2020-10-19 LAB — SARS-COV-2, NAA 2 DAY TAT

## 2020-10-25 ENCOUNTER — Other Ambulatory Visit: Payer: Self-pay

## 2020-10-25 ENCOUNTER — Ambulatory Visit (INDEPENDENT_AMBULATORY_CARE_PROVIDER_SITE_OTHER): Payer: BC Managed Care – PPO | Admitting: Physician Assistant

## 2020-10-25 VITALS — BP 140/85 | HR 84

## 2020-10-25 DIAGNOSIS — Z111 Encounter for screening for respiratory tuberculosis: Secondary | ICD-10-CM

## 2020-10-25 NOTE — Progress Notes (Signed)
Established Patient Office Visit  Subjective:  Patient ID: Stephen Orozco, male    DOB: Sep 16, 1971  Age: 50 y.o. MRN: [id number]  CC:  Chief Complaint  Patient presents with  . PPD Placement    HPI Stephen Orozco presents for PPD placement.   Past Medical History:  Diagnosis Date  . Hyperlipidemia   . Hypertension     Past Surgical History:  Procedure Laterality Date  . APPENDECTOMY    . medial malleolar fixation Right    Ankle  . MENISCUS REPAIR Left 2010    Family History  Problem Relation Age of Onset  . Alcohol abuse Father   . Diabetes Father   . Hyperlipidemia Father   . Hypertension Father   . Gout Father   . Bipolar disorder Father   . Cancer Unknown   . Heart attack Unknown   . Depression Unknown   . Autism spectrum disorder Son   . ADD / ADHD Son     Social History   Socioeconomic History  . Marital status: Married    Spouse name: Not on file  . Number of children: 2  . Years of education: Not on file  . Highest education level: Not on file  Occupational History  . Occupation: unemployed.   Tobacco Use  . Smoking status: Former Smoker    Packs/day: 0.30    Years: 10.00    Pack years: 3.00    Types: Cigarettes    Quit date: 09/14/2012    Years since quitting: 8.1  . Smokeless tobacco: Never Used  Substance and Sexual Activity  . Alcohol use: Yes    Alcohol/week: 2.0 - 3.0 standard drinks    Types: 2 - 3 Standard drinks or equivalent per week    Comment: Occasional use  . Drug use: No  . Sexual activity: Yes    Partners: Female    Birth control/protection: None, Surgical  Other Topics Concern  . Not on file  Social History Narrative   1 half caff drink daily. No regular exercise. Wife is a Marine scientist. He is unemployed right now.    Social Determinants of Health   Financial Resource Strain: Not on file  Food Insecurity: Not on file  Transportation Needs: Not on file  Physical Activity: Not on file  Stress: Not on file  Social  Connections: Not on file  Intimate Partner Violence: Not on file    Outpatient Medications Prior to Visit  Medication Sig Dispense Refill  . AMBULATORY NON FORMULARY MEDICATION Medication Name: *New start CPAP for OSA.  SEt to 12 cm water pressure. Large size Fisher&Paykel Full Face Mask Simplus mask and heated humidification. Please fax download after one week.  Fax to [facility]. 1 Units 0  . amphetamine-dextroamphetamine (ADDERALL) 10 MG tablet Take 10 mg by mouth 2 (two) times daily.    Marland Kitchen lisinopril (ZESTRIL) 10 MG tablet Take 1 tablet (10 mg total) by mouth daily. 90 tablet 1  . metaxalone (SKELAXIN) 400 MG tablet TAKE 1 to 1/2 TABLETS (200-400 MG TOTAL) BY MOUTH 2 (TWO) TIMES DAILY AS NEEDED For Back Pain. 30 tablet 1  . pravastatin (PRAVACHOL) 40 MG tablet TAKE 1 TABLET BY MOUTH EVERY DAY 90 tablet 3  . sertraline (ZOLOFT) 100 MG tablet Take 100 mg by mouth 2 (two) times daily.     No facility-administered medications prior to visit.    Allergies  Allergen Reactions  . Flexeril [Cyclobenzaprine] Other (See Comments)    Excess sedation  . Adhesive [  Tape] Rash    ROS Review of Systems    Objective:    Physical Exam  BP 140/85   Pulse 84   SpO2 99%  Wt Readings from Last 3 Encounters:  09/01/20 267 lb (121.1 kg)  10/26/19 258 lb (117 kg)  07/02/19 258 lb (117 kg)     Health Maintenance Due  Topic Date Due  . COLONOSCOPY (Pts 45-63yrs Insurance coverage will need to be confirmed)  Never done    There are no preventive care reminders to display for this patient.  Lab Results  Component Value Date   TSH 1.73 02/21/2018   Lab Results  Component Value Date   WBC 5.8 09/07/2020   HGB 14.9 09/07/2020   HCT 42.7 09/07/2020   MCV 91.8 09/07/2020   PLT 148 09/07/2020   Lab Results  Component Value Date   NA 133 (L) 09/07/2020   K 4.4 09/07/2020   CO2 27 09/07/2020   GLUCOSE 107 (H) 09/07/2020   BUN 12 09/07/2020   CREATININE 0.97 09/07/2020   BILITOT 0.6  09/07/2020   ALKPHOS 57 02/15/2017   AST 26 09/07/2020   ALT 38 09/07/2020   PROT 6.9 09/07/2020   ALBUMIN 4.5 02/15/2017   CALCIUM 9.3 09/07/2020   Lab Results  Component Value Date   CHOL 217 (H) 09/07/2020   Lab Results  Component Value Date   HDL 38 (L) 09/07/2020   Lab Results  Component Value Date   LDLCALC 133 (H) 09/07/2020   Lab Results  Component Value Date   TRIG 323 (H) 09/07/2020   Lab Results  Component Value Date   CHOLHDL 5.7 (H) 09/07/2020   Lab Results  Component Value Date   HGBA1C 5.6 02/21/2018      Assessment & Plan:  PPD placement - Patient tolerated injection well without complications. Patient advised to schedule next injection 2 days from today.    Problem List Items Addressed This Visit   None   Visit Diagnoses    Screening-pulmonary TB    -  Primary   Relevant Orders   PPD (Completed)      No orders of the defined types were placed in this encounter.   Follow-up: Return in about 2 days (around 10/27/2020) for PPD read. Durene Romans, Monico Blitz, [place]

## 2020-10-27 ENCOUNTER — Ambulatory Visit (INDEPENDENT_AMBULATORY_CARE_PROVIDER_SITE_OTHER): Payer: BC Managed Care – PPO | Admitting: Family Medicine

## 2020-10-27 ENCOUNTER — Other Ambulatory Visit: Payer: Self-pay

## 2020-10-27 VITALS — BP 136/83 | HR 93

## 2020-10-27 DIAGNOSIS — Z111 Encounter for screening for respiratory tuberculosis: Secondary | ICD-10-CM

## 2020-10-27 LAB — TB SKIN TEST
Induration: 0 mm
TB Skin Test: NEGATIVE

## 2020-10-27 NOTE — Progress Notes (Signed)
Agree with documentation as above.   Dylyn Mclaren, MD  

## 2020-10-27 NOTE — Progress Notes (Signed)
Established Patient Office Visit  Subjective:  Patient ID: Stephen Orozco, male    DOB: 08/09/1971  Age: 50 y.o. MRN: [id number]  CC:  Chief Complaint  Patient presents with  . PPD Reading    HPI Stephen Orozco presents for PPD read.  Past Medical History:  Diagnosis Date  . Hyperlipidemia   . Hypertension     Past Surgical History:  Procedure Laterality Date  . APPENDECTOMY    . medial malleolar fixation Right    Ankle  . MENISCUS REPAIR Left 2010    Family History  Problem Relation Age of Onset  . Alcohol abuse Father   . Diabetes Father   . Hyperlipidemia Father   . Hypertension Father   . Gout Father   . Bipolar disorder Father   . Cancer Unknown   . Heart attack Unknown   . Depression Unknown   . Autism spectrum disorder Son   . ADD / ADHD Son     Social History   Socioeconomic History  . Marital status: Married    Spouse name: Not on file  . Number of children: 2  . Years of education: Not on file  . Highest education level: Not on file  Occupational History  . Occupation: unemployed.   Tobacco Use  . Smoking status: Former Smoker    Packs/day: 0.30    Years: 10.00    Pack years: 3.00    Types: Cigarettes    Quit date: 09/14/2012    Years since quitting: 8.1  . Smokeless tobacco: Never Used  Substance and Sexual Activity  . Alcohol use: Yes    Alcohol/week: 2.0 - 3.0 standard drinks    Types: 2 - 3 Standard drinks or equivalent per week    Comment: Occasional use  . Drug use: No  . Sexual activity: Yes    Partners: Female    Birth control/protection: None, Surgical  Other Topics Concern  . Not on file  Social History Narrative   1 half caff drink daily. No regular exercise. Wife is a Marine scientist. He is unemployed right now.    Social Determinants of Health   Financial Resource Strain: Not on file  Food Insecurity: Not on file  Transportation Needs: Not on file  Physical Activity: Not on file  Stress: Not on file  Social  Connections: Not on file  Intimate Partner Violence: Not on file    Outpatient Medications Prior to Visit  Medication Sig Dispense Refill  . AMBULATORY NON FORMULARY MEDICATION Medication Name: *New start CPAP for OSA.  SEt to 12 cm water pressure. Large size Fisher&Paykel Full Face Mask Simplus mask and heated humidification. Please fax download after one week.  Fax to [employer]. 1 Units 0  . amphetamine-dextroamphetamine (ADDERALL) 10 MG tablet Take 10 mg by mouth 2 (two) times daily.    Marland Kitchen lisinopril (ZESTRIL) 10 MG tablet Take 1 tablet (10 mg total) by mouth daily. 90 tablet 1  . metaxalone (SKELAXIN) 400 MG tablet TAKE 1 to 1/2 TABLETS (200-400 MG TOTAL) BY MOUTH 2 (TWO) TIMES DAILY AS NEEDED For Back Pain. 30 tablet 1  . pravastatin (PRAVACHOL) 40 MG tablet TAKE 1 TABLET BY MOUTH EVERY DAY 90 tablet 3  . sertraline (ZOLOFT) 100 MG tablet Take 100 mg by mouth 2 (two) times daily.     No facility-administered medications prior to visit.    Allergies  Allergen Reactions  . Flexeril [Cyclobenzaprine] Other (See Comments)    Excess sedation  . Adhesive [Tape]  Rash    ROS Review of Systems    Objective:    Physical Exam  BP 136/83   Pulse 93   SpO2 99%  Wt Readings from Last 3 Encounters:  09/01/20 267 lb (121.1 kg)  10/26/19 258 lb (117 kg)  07/02/19 258 lb (117 kg)     Health Maintenance Due  Topic Date Due  . COLONOSCOPY (Pts 45-35yrs Insurance coverage will need to be confirmed)  Never done    There are no preventive care reminders to display for this patient.  Lab Results  Component Value Date   TSH 1.73 02/21/2018   Lab Results  Component Value Date   WBC 5.8 09/07/2020   HGB 14.9 09/07/2020   HCT 42.7 09/07/2020   MCV 91.8 09/07/2020   PLT 148 09/07/2020   Lab Results  Component Value Date   NA 133 (L) 09/07/2020   K 4.4 09/07/2020   CO2 27 09/07/2020   GLUCOSE 107 (H) 09/07/2020   BUN 12 09/07/2020   CREATININE 0.97 09/07/2020   BILITOT 0.6  09/07/2020   ALKPHOS 57 02/15/2017   AST 26 09/07/2020   ALT 38 09/07/2020   PROT 6.9 09/07/2020   ALBUMIN 4.5 02/15/2017   CALCIUM 9.3 09/07/2020   Lab Results  Component Value Date   CHOL 217 (H) 09/07/2020   Lab Results  Component Value Date   HDL 38 (L) 09/07/2020   Lab Results  Component Value Date   LDLCALC 133 (H) 09/07/2020   Lab Results  Component Value Date   TRIG 323 (H) 09/07/2020   Lab Results  Component Value Date   CHOLHDL 5.7 (H) 09/07/2020   Lab Results  Component Value Date   HGBA1C 5.6 02/21/2018      Assessment & Plan:  PPD Read - negative 0 mm   Problem List Items Addressed This Visit   None   Visit Diagnoses    Screening-pulmonary TB    -  Primary      No orders of the defined types were placed in this encounter.   Follow-up: No follow-ups on file.    Lavell Luster, [place]

## 2020-11-03 ENCOUNTER — Ambulatory Visit (AMBULATORY_SURGERY_CENTER): Payer: Self-pay

## 2020-11-03 ENCOUNTER — Other Ambulatory Visit: Payer: Self-pay

## 2020-11-03 VITALS — Ht 72.0 in | Wt 269.0 lb

## 2020-11-03 DIAGNOSIS — Z1211 Encounter for screening for malignant neoplasm of colon: Secondary | ICD-10-CM

## 2020-11-03 MED ORDER — SUTAB 1479-225-188 MG PO TABS: 12.0000 | ORAL_TABLET | ORAL | 0 refills | Status: DC

## 2020-11-03 NOTE — Progress Notes (Signed)
No allergies to soy or egg Pt is not on blood thinners or diet pills Denies issues with sedation/intubation Denies atrial flutter/fib Denies constipation   Emmi instructions given to pt  Pt is aware of Covid safety and care partner requirements.  

## 2020-11-07 ENCOUNTER — Encounter: Payer: Self-pay | Admitting: Family Medicine

## 2020-11-09 ENCOUNTER — Encounter: Payer: Self-pay | Admitting: Gastroenterology

## 2020-11-18 ENCOUNTER — Other Ambulatory Visit: Payer: Self-pay

## 2020-11-18 ENCOUNTER — Encounter: Payer: Self-pay | Admitting: Gastroenterology

## 2020-11-18 ENCOUNTER — Ambulatory Visit (AMBULATORY_SURGERY_CENTER): Payer: BC Managed Care – PPO | Admitting: Gastroenterology

## 2020-11-18 VITALS — BP 130/78 | HR 70 | Temp 97.1°F | Resp 22 | Ht 72.0 in | Wt 269.0 lb

## 2020-11-18 DIAGNOSIS — D125 Benign neoplasm of sigmoid colon: Secondary | ICD-10-CM

## 2020-11-18 DIAGNOSIS — D122 Benign neoplasm of ascending colon: Secondary | ICD-10-CM

## 2020-11-18 DIAGNOSIS — K641 Second degree hemorrhoids: Secondary | ICD-10-CM

## 2020-11-18 DIAGNOSIS — Z1211 Encounter for screening for malignant neoplasm of colon: Secondary | ICD-10-CM | POA: Diagnosis present

## 2020-11-18 MED ORDER — SODIUM CHLORIDE 0.9 % IV SOLN: 500.0000 mL | Freq: Once | INTRAVENOUS | Status: DC

## 2020-11-18 NOTE — Progress Notes (Signed)
Called to room to assist during endoscopic procedure.  Patient ID and intended procedure confirmed with present staff. Received instructions for my participation in the procedure from the performing physician.  

## 2020-11-18 NOTE — Op Note (Signed)
[facility] Patient Name: Stephen Orozco Procedure Date: 11/18/2020 8:19 AM MRN: [id number] Endoscopist: Gerrit Heck , MD Age: 50 Referring MD:  Date of Birth: 06/13/1971 Gender: Male Account #: [id number] Procedure:                Colonoscopy Indications:              Screening for colorectal malignant neoplasm, This                            is the patient's first colonoscopy Medicines:                Monitored Anesthesia Care Procedure:                Pre-Anesthesia Assessment:                           - Prior to the procedure, a History and Physical                            was performed, and patient medications and                            allergies were reviewed. The patient's tolerance of                            previous anesthesia was also reviewed. The risks                            and benefits of the procedure and the sedation                            options and risks were discussed with the patient.                            All questions were answered, and informed consent                            was obtained. Prior Anticoagulants: The patient has                            taken no previous anticoagulant or antiplatelet                            agents. ASA Grade Assessment: II - A patient with                            mild systemic disease. After reviewing the risks                            and benefits, the patient was deemed in                            satisfactory condition to undergo the procedure.  After obtaining informed consent, the colonoscope                            was passed under direct vision. Throughout the                            procedure, the patient's blood pressure, pulse, and                            oxygen saturations were monitored continuously. The                            Olympus CF-HQ190L [id number]) Colonoscope was                            introduced through the anus  and advanced to the the                            terminal ileum. The colonoscopy was performed                            without difficulty. The patient tolerated the                            procedure well. The quality of the bowel                            preparation was good. The terminal ileum, ileocecal                            valve, appendiceal orifice, and rectum were                            photographed. Scope In: 8:28:37 AM Scope Out: 8:56:32 AM Scope Withdrawal Time: 0 hours 25 minutes 16 seconds  Total Procedure Duration: 0 hours 27 minutes 55 seconds  Findings:                 Hemorrhoids were found on perianal exam.                           Four sessile polyps were found in the sigmoid colon                            (1) and ascending colon (3). The polyps were 2 to 8                            mm in size. These polyps were removed with a cold                            snare. Resection and retrieval were complete.                            Estimated blood loss was minimal.  Non-bleeding internal hemorrhoids were found during                            retroflexion. The hemorrhoids were small and Grade                            II (internal hemorrhoids that prolapse but reduce                            spontaneously).                           The terminal ileum appeared normal. Complications:            No immediate complications. Estimated Blood Loss:     Estimated blood loss was minimal. Impression:               - Hemorrhoids found on perianal exam.                           - Four 2 to 8 mm polyps in the sigmoid colon and in                            the ascending colon, removed with a cold snare.                            Resected and retrieved.                           - Non-bleeding internal hemorrhoids.                           - The examined portion of the ileum was normal. Recommendation:           - Patient has a  contact number available for                            emergencies. The signs and symptoms of potential                            delayed complications were discussed with the                            patient. Return to normal activities tomorrow.                            Written discharge instructions were provided to the                            patient.                           - Resume previous diet.                           - Continue present medications.                           -  Await pathology results.                           - Repeat colonoscopy in 3 - 5 years for                            surveillance based on pathology results.                           - Return to GI office PRN.                           - Use fiber, for example Citrucel, Fibercon, Konsyl                            or Metamucil.                           - Internal hemorrhoids were noted on this study and                            may be amenable to hemorrhoid band ligation. If you                            are interested in further treatment of these                            hemorrhoids with band ligation, please contact my                            clinic to set up an appointment for evaluation and                            treatment. Gerrit Heck, MD 11/18/2020 9:01:35 AM

## 2020-11-18 NOTE — Patient Instructions (Signed)
Please read handouts provided. Continue present medications. Await pathology results. Return to GI office as needed. Consider using a fiber supplement.    YOU HAD AN ENDOSCOPIC PROCEDURE TODAY AT THE [facility]:   Refer to the procedure report that was given to you for any specific questions about what was found during the examination.  If the procedure report does not answer your questions, please call your gastroenterologist to clarify.  If you requested that your care partner not be given the details of your procedure findings, then the procedure report has been included in a sealed envelope for you to review at your convenience later.  YOU SHOULD EXPECT: Some feelings of bloating in the abdomen. Passage of more gas than usual.  Walking can help get rid of the air that was put into your GI tract during the procedure and reduce the bloating. If you had a lower endoscopy (such as a colonoscopy or flexible sigmoidoscopy) you may notice spotting of blood in your stool or on the toilet paper. If you underwent a bowel prep for your procedure, you may not have a normal bowel movement for a few days.  Please Note:  You might notice some irritation and congestion in your nose or some drainage.  This is from the oxygen used during your procedure.  There is no need for concern and it should clear up in a day or so.  SYMPTOMS TO REPORT IMMEDIATELY:   Following lower endoscopy (colonoscopy or flexible sigmoidoscopy):  Excessive amounts of blood in the stool  Significant tenderness or worsening of abdominal pains  Swelling of the abdomen that is new, acute  Fever of 100F or higher    For urgent or emergent issues, a gastroenterologist can be reached at any hour by calling [phone]. Do not use MyChart messaging for urgent concerns.    DIET:  We do recommend a small meal at first, but then you may proceed to your regular diet.  Drink plenty of fluids but you should avoid  alcoholic beverages for 24 hours.  ACTIVITY:  You should plan to take it easy for the rest of today and you should NOT DRIVE or use heavy machinery until tomorrow (because of the sedation medicines used during the test).    FOLLOW UP: Our staff will call the number listed on your records 48-72 hours following your procedure to check on you and address any questions or concerns that you may have regarding the information given to you following your procedure. If we do not reach you, we will leave a message.  We will attempt to reach you two times.  During this call, we will ask if you have developed any symptoms of COVID 19. If you develop any symptoms (ie: fever, flu-like symptoms, shortness of breath, cough etc.) before then, please call [phone].  If you test positive for Covid 19 in the 2 weeks post procedure, please call and report this information to us.    If any biopsies were taken you will be contacted by phone or by letter within the next 1-3 weeks.  Please call us at [phone] if you have not heard about the biopsies in 3 weeks.    SIGNATURES/CONFIDENTIALITY: You and/or your care partner have signed paperwork which will be entered into your electronic medical record.  These signatures attest to the fact that that the information above on your After Visit Summary has been reviewed and is understood.  Full responsibility of the confidentiality of this discharge   discharge information lies with you and/or your care-partner. 

## 2020-11-18 NOTE — Progress Notes (Signed)
PT taken to PACU. Monitors in place. VSS. Report given to RN. 

## 2020-11-18 NOTE — Progress Notes (Signed)
VS by KW.  previsit by TM.  Pt states no changes in health hx since previsit

## 2020-11-22 ENCOUNTER — Telehealth: Payer: Self-pay | Admitting: *Deleted

## 2020-11-22 ENCOUNTER — Telehealth: Payer: Self-pay

## 2020-11-22 NOTE — Telephone Encounter (Signed)
No answer, left message to call back later today, B.China Deitrick RN. 

## 2020-11-22 NOTE — Telephone Encounter (Signed)
1. Have you developed a fever since your procedure? no  2.   Have you had an respiratory symptoms (SOB or cough) since your procedure? no  3.   Have you tested positive for COVID 19 since your procedure no  4.   Have you had any family members/close contacts diagnosed with the COVID 19 since your procedure?  no   If yes to any of these questions please route to Joylene John, RN and Joella Prince, RN Follow up Call-  Call back number 11/18/2020  Post procedure Call Back phone  # [phone]  Permission to leave phone message Yes  Some recent data might be hidden     Patient questions:  Do you have a fever, pain , or abdominal swelling? No. Pain Score  0 *  Have you tolerated food without any problems? Yes.    Have you been able to return to your normal activities? Yes.    Do you have any questions about your discharge instructions: Diet   No. Medications  No. Follow up visit  No.  Do you have questions or concerns about your Care? No.  Actions: * If pain score is 4 or above: No action needed, pain <4.

## 2020-11-28 ENCOUNTER — Encounter: Payer: Self-pay | Admitting: Gastroenterology

## 2020-12-26 ENCOUNTER — Encounter: Payer: Self-pay | Admitting: Family Medicine

## 2020-12-27 ENCOUNTER — Encounter: Payer: Self-pay | Admitting: Physician Assistant

## 2020-12-27 ENCOUNTER — Other Ambulatory Visit: Payer: Self-pay

## 2020-12-27 ENCOUNTER — Ambulatory Visit (INDEPENDENT_AMBULATORY_CARE_PROVIDER_SITE_OTHER): Payer: BC Managed Care – PPO | Admitting: Physician Assistant

## 2020-12-27 VITALS — BP 119/70 | HR 89 | Temp 98.8°F | Ht 72.0 in | Wt 269.0 lb

## 2020-12-27 DIAGNOSIS — R6889 Other general symptoms and signs: Secondary | ICD-10-CM

## 2020-12-27 DIAGNOSIS — R0989 Other specified symptoms and signs involving the circulatory and respiratory systems: Secondary | ICD-10-CM | POA: Insufficient documentation

## 2020-12-27 DIAGNOSIS — J3089 Other allergic rhinitis: Secondary | ICD-10-CM

## 2020-12-27 DIAGNOSIS — I10 Essential (primary) hypertension: Secondary | ICD-10-CM | POA: Diagnosis not present

## 2020-12-27 DIAGNOSIS — F900 Attention-deficit hyperactivity disorder, predominantly inattentive type: Secondary | ICD-10-CM | POA: Diagnosis not present

## 2020-12-27 MED ORDER — FLUTICASONE PROPIONATE 50 MCG/ACT NA SUSP: 2.0000 | Freq: Every day | NASAL | 2 refills | Status: DC

## 2020-12-27 MED ORDER — METHYLPREDNISOLONE SODIUM SUCC 125 MG IJ SOLR
125.0000 mg | Freq: Once | INTRAMUSCULAR | Status: AC
Start: 2020-12-27 — End: 2020-12-27
  Administered 2020-12-27: 125 mg via INTRAMUSCULAR

## 2020-12-27 MED ORDER — LISINOPRIL 10 MG PO TABS
10.0000 mg | ORAL_TABLET | Freq: Every day | ORAL | 0 refills | Status: DC
Start: 2020-12-27 — End: 2021-07-28

## 2020-12-27 MED ORDER — AMPHETAMINE-DEXTROAMPHETAMINE 10 MG PO TABS
10.0000 mg | ORAL_TABLET | Freq: Two times a day (BID) | ORAL | 0 refills | Status: DC
Start: 2020-12-27 — End: 2021-03-01

## 2020-12-27 NOTE — Patient Instructions (Signed)
Start claritin D and flonase.

## 2020-12-27 NOTE — Progress Notes (Signed)
Subjective:    Patient ID: Stephen Orozco, male    DOB: 1971-06-08, 50 y.o.   MRN: [id number]  HPI  Patient is a 50 year old obese male who presents to the clinic to discuss 1 week of throat clearing, sinus drainage.  Typically allergy medication puts him to sleep and does not like to try it.  He denies any sneezing or watery eyes.  He does feel a bit more tired and easily winded.  He has had 2 - Covid test on Sunday and today.  He is fully vaccinated.  He denies any body aches, fevers, chills, sinus pressure, cough, shortness of breath, diarrhea, nausea, chest pain, palpitations.  He has not really tried anything to make better.  Pt is on lisinopril for BP control. No concerns.  Patient denies any chest pain, palpitations, headaches, vision changes, dizziness.  He is not checking his blood pressure at home.  Per patient Dr. Jerilynn Mages agreed to take over adderall. He is in need of medication refill. No problems or concerns. He has been controlled on low dose for quite some time.  .. Active Ambulatory Problems    Diagnosis Date Noted  . Anxiety 02/26/2012  . Essential hypertension, benign 02/26/2012  . Obesity, Class II, BMI 35-39.9, with comorbidity 04/22/2012  . Hyperlipidemia 11/20/2013  . MDD (major depressive disorder) 02/13/2018  . OSA (obstructive sleep apnea) 03/17/2018  . History of DVT (deep vein thrombosis) 03/17/2018  . Low testosterone 10/02/2018  . Environmental and seasonal allergies 12/27/2020  . Throat clearing 12/27/2020   Resolved Ambulatory Problems    Diagnosis Date Noted  . Other and unspecified hyperlipidemia 04/22/2012  . Tobacco abuse 11/20/2013   Past Medical History:  Diagnosis Date  . ADHD   . Arthritis   . Depression   . Hypertension   . Sleep apnea        Review of Systems  All other systems reviewed and are negative.      Objective:   Physical Exam Vitals reviewed.  Constitutional:      Appearance: Normal appearance. He is obese.  HENT:      Head: Normocephalic.     Right Ear: Tympanic membrane, ear canal and external ear normal. There is no impacted cerumen.     Left Ear: Tympanic membrane, ear canal and external ear normal. There is no impacted cerumen.     Nose: Congestion present.     Mouth/Throat:     Mouth: Mucous membranes are moist.     Comments: Posterior oropharynx erythema with some PND.  Eyes:     Extraocular Movements: Extraocular movements intact.     Conjunctiva/sclera: Conjunctivae normal.     Pupils: Pupils are equal, round, and reactive to light.  Neck:     Vascular: No carotid bruit.  Cardiovascular:     Rate and Rhythm: Normal rate and regular rhythm.     Pulses: Normal pulses.     Heart sounds: Normal heart sounds.  Pulmonary:     Effort: Pulmonary effort is normal.     Breath sounds: Normal breath sounds.  Musculoskeletal:     Right lower leg: No edema.     Left lower leg: No edema.  Lymphadenopathy:     Cervical: No cervical adenopathy.  Neurological:     General: No focal deficit present.     Mental Status: He is alert and oriented to person, place, and time.  Psychiatric:        Mood and Affect: Mood normal.  Assessment & Plan:  Marland KitchenMarland KitchenAydrien was seen today for allergies.  Diagnoses and all orders for this visit:  Environmental and seasonal allergies -     fluticasone (FLONASE) 50 MCG/ACT nasal spray; Place 2 sprays into both nostrils daily. -     methylPREDNISolone sodium succinate (SOLU-MEDROL) 125 mg/2 mL injection 125 mg  Essential hypertension, benign -     lisinopril (ZESTRIL) 10 MG tablet; Take 1 tablet (10 mg total) by mouth daily.  Throat clearing -     fluticasone (FLONASE) 50 MCG/ACT nasal spray; Place 2 sprays into both nostrils daily.  Attention deficit hyperactivity disorder (ADHD), predominantly inattentive type -     amphetamine-dextroamphetamine (ADDERALL) 10 MG tablet; Take 1 tablet (10 mg total) by mouth 2 (two) times daily.   No signs of bacterial  infection today. Seems more allergies or could be viral. Start OTC claritin-D. Add flonase. Solumedrol 125mg  IM given today in office. Follow up as needed. 2 negative covid and fully vaccinated.   BP to goal today.  Refilled lisinopril.   Marland KitchenMarland KitchenPDMP reviewed during this encounter. No concerns.  Sent adderall.  Needs to follow up with PCP for future refills.

## 2020-12-30 ENCOUNTER — Telehealth: Payer: Self-pay

## 2020-12-30 NOTE — Telephone Encounter (Signed)
PA sent for amphetamine-dextroamphetamine (ADDERALL) 10 MG tablet

## 2021-01-06 NOTE — Telephone Encounter (Signed)
[facility]  received a request from your provider for coverage of AmphetamineDextroamphetamine 10MG  OR TABS. As long as you remain covered by the [facility] and there are no changes to your plan benefits, this request is approved for the following time period: 12/30/2020 - 12/31/2023

## 2021-01-17 ENCOUNTER — Other Ambulatory Visit: Payer: Self-pay

## 2021-01-17 ENCOUNTER — Ambulatory Visit: Payer: BC Managed Care – PPO | Admitting: Family Medicine

## 2021-01-17 ENCOUNTER — Encounter: Payer: Self-pay | Admitting: Family Medicine

## 2021-01-17 VITALS — BP 111/65 | HR 77 | Temp 99.0°F | Ht 72.0 in | Wt 267.0 lb

## 2021-01-17 DIAGNOSIS — R0982 Postnasal drip: Secondary | ICD-10-CM | POA: Diagnosis not present

## 2021-01-17 DIAGNOSIS — J018 Other acute sinusitis: Secondary | ICD-10-CM

## 2021-01-17 MED ORDER — AMOXICILLIN-POT CLAVULANATE 875-125 MG PO TABS: 1.0000 | ORAL_TABLET | Freq: Two times a day (BID) | ORAL | 0 refills | Status: DC

## 2021-01-17 NOTE — Progress Notes (Signed)
Acute Office Visit  Subjective:    Patient ID: Stephen Orozco, male    DOB: June 04, 1971, 50 y.o.   MRN: [id number]  Chief Complaint  Patient presents with  . Follow-up    HPI Patient is in today for persistent symptoms he was seen about 2 weeks ago for postnasal drip mild cough and mild nasal congestion and it was felt to be most consistent with allergies he received a Solu-Medrol injection and says about 2 days later he felt like he was getting some relief and then that lasted for about 2 days and then felt like it wore off.  He has been using Flonase during the day and Zyrtec at night.  Now he feels like he is getting a sore throat over the last couple of days and just feels a little bit worse he has had some intermittent sinus pressure over the nasal bridge and behind his eyes.  He reports that he occasionally feels short of breath but it does not necessarily relate to activity and has been going on for longer than he has been sick.  He denies any chest pain or shortness of breath.  He says this does not feel like typical allergy symptoms to him normally when he gets allergies he just sneezes a lot.  The weekend he just felt extremely fatigued to the point where it was hard to get motivated to do things.  Past Medical History:  Diagnosis Date  . ADHD   . Anxiety   . Arthritis   . Depression   . Hyperlipidemia   . Hypertension   . Sleep apnea    cpap    Past Surgical History:  Procedure Laterality Date  . APPENDECTOMY    . medial malleolar fixation Right    Ankle  . MENISCUS REPAIR Left 2010    Family History  Problem Relation Age of Onset  . Alcohol abuse Father   . Diabetes Father   . Hyperlipidemia Father   . Hypertension Father   . Gout Father   . Bipolar disorder Father   . Cancer Other   . Heart attack Other   . Depression Other   . Autism spectrum disorder Son   . ADD / ADHD Son   . Colon cancer Neg Hx   . Colon polyps Neg Hx   . Esophageal cancer Neg Hx    . Stomach cancer Neg Hx   . Rectal cancer Neg Hx     Social History   Socioeconomic History  . Marital status: Married    Spouse name: Not on file  . Number of children: 2  . Years of education: Not on file  . Highest education level: Not on file  Occupational History  . Occupation: unemployed.   Tobacco Use  . Smoking status: Former Smoker    Packs/day: 0.30    Years: 10.00    Pack years: 3.00    Types: Cigarettes    Quit date: 09/14/2012    Years since quitting: 8.3  . Smokeless tobacco: Never Used  Vaping Use  . Vaping Use: Never used  Substance and Sexual Activity  . Alcohol use: Yes    Alcohol/week: 2.0 - 3.0 standard drinks    Types: 2 - 3 Standard drinks or equivalent per week    Comment: Occasional use  . Drug use: No  . Sexual activity: Yes    Partners: Female    Birth control/protection: None, Surgical  Other Topics Concern  . Not on file  Social History Narrative   1 half caff drink daily. No regular exercise. Wife is a Marine scientist. He is unemployed right now.    Social Determinants of Health   Financial Resource Strain: Not on file  Food Insecurity: Not on file  Transportation Needs: Not on file  Physical Activity: Not on file  Stress: Not on file  Social Connections: Not on file  Intimate Partner Violence: Not on file    Outpatient Medications Prior to Visit  Medication Sig Dispense Refill  . AMBULATORY NON FORMULARY MEDICATION Medication Name: *New start CPAP for OSA.  SEt to 12 cm water pressure. Large size Fisher[employer]Paykel Full Face Mask Simplus mask and heated humidification. Please fax download after one week.  Fax to [employer]. 1 Units 0  . amphetamine-dextroamphetamine (ADDERALL) 10 MG tablet Take 1 tablet (10 mg total) by mouth 2 (two) times daily. 60 tablet 0  . fluticasone (FLONASE) 50 MCG/ACT nasal spray Place 2 sprays into both nostrils daily. 9.9 mL 2  . lisinopril (ZESTRIL) 10 MG tablet Take 1 tablet (10 mg total) by mouth daily. 90 tablet 0   . metaxalone (SKELAXIN) 400 MG tablet TAKE 1 to 1/2 TABLETS (200-400 MG TOTAL) BY MOUTH 2 (TWO) TIMES DAILY AS NEEDED For Back Pain. 30 tablet 1  . pravastatin (PRAVACHOL) 40 MG tablet TAKE 1 TABLET BY MOUTH EVERY DAY 90 tablet 3  . sertraline (ZOLOFT) 100 MG tablet Take 100 mg by mouth 2 (two) times daily.     No facility-administered medications prior to visit.    Allergies  Allergen Reactions  . Flexeril [Cyclobenzaprine] Other (See Comments)    Excess sedation  . Adhesive [Tape] Rash    Review of Systems     Objective:    Physical Exam Constitutional:      Appearance: He is well-developed.  HENT:     Head: Normocephalic and atraumatic.     Right Ear: External ear normal.     Left Ear: External ear normal.     Nose: Nose normal.  Eyes:     Conjunctiva/sclera: Conjunctivae normal.     Pupils: Pupils are equal, round, and reactive to light.  Neck:     Thyroid: No thyromegaly.  Cardiovascular:     Rate and Rhythm: Normal rate.     Heart sounds: Normal heart sounds.  Pulmonary:     Effort: Pulmonary effort is normal.     Breath sounds: Normal breath sounds.  Musculoskeletal:     Cervical back: Neck supple.  Lymphadenopathy:     Cervical: No cervical adenopathy.  Skin:    General: Skin is warm and dry.  Neurological:     Mental Status: He is alert and oriented to person, place, and time.     BP 111/65   Pulse 77   Temp 99 F (37.2 C) (Oral)   Ht 6' (1.829 m)   Wt 267 lb (121.1 kg)   SpO2 99%   BMI 36.21 kg/m  Wt Readings from Last 3 Encounters:  01/17/21 267 lb (121.1 kg)  12/27/20 269 lb (122 kg)  11/18/20 269 lb (122 kg)    There are no preventive care reminders to display for this patient.  There are no preventive care reminders to display for this patient.   Lab Results  Component Value Date   TSH 1.73 02/21/2018   Lab Results  Component Value Date   WBC 5.8 09/07/2020   HGB 14.9 09/07/2020   HCT 42.7 09/07/2020   MCV 91.8 09/07/2020  PLT 148 09/07/2020   Lab Results  Component Value Date   NA 133 (L) 09/07/2020   K 4.4 09/07/2020   CO2 27 09/07/2020   GLUCOSE 107 (H) 09/07/2020   BUN 12 09/07/2020   CREATININE 0.97 09/07/2020   BILITOT 0.6 09/07/2020   ALKPHOS 57 02/15/2017   AST 26 09/07/2020   ALT 38 09/07/2020   PROT 6.9 09/07/2020   ALBUMIN 4.5 02/15/2017   CALCIUM 9.3 09/07/2020   Lab Results  Component Value Date   CHOL 217 (H) 09/07/2020   Lab Results  Component Value Date   HDL 38 (L) 09/07/2020   Lab Results  Component Value Date   LDLCALC 133 (H) 09/07/2020   Lab Results  Component Value Date   TRIG 323 (H) 09/07/2020   Lab Results  Component Value Date   CHOLHDL 5.7 (H) 09/07/2020   Lab Results  Component Value Date   HGBA1C 5.6 02/21/2018       Assessment & Plan:   Problem List Items Addressed This Visit   None   Visit Diagnoses    Acute non-recurrent sinusitis of other sinus    -  Primary   Relevant Medications   amoxicillin-clavulanate (AUGMENTIN) 875-125 MG tablet   Post-nasal drip         Acute sinusitis I think his initial symptoms may have been more consistent with a viral illness and it has now gotten worse in the last couple days so we will go ahead and treat with Augmentin.  If not better by the end of the week please give [name] call back.  If he develops new symptoms then please let us know.  Still struggling with fatigue I am more than happy to do some additional blood work to work this up further he did have a normal CBC and CMP back in the fall.  Sitter switching the Zyrtec to [employer] which is a little less sedating.  Meds ordered this encounter  Medications  . amoxicillin-clavulanate (AUGMENTIN) 875-125 MG tablet    Sig: Take 1 tablet by mouth 2 (two) times daily.    Dispense:  14 tablet    Refill:  0     Beatrice Lecher, MD

## 2021-01-17 NOTE — Patient Instructions (Signed)
Ok to change the the zyrtec to [employer]

## 2021-01-17 NOTE — Progress Notes (Signed)
He reports that he has had some facial pressure,bodyache and sore throat x 2 days.  Coughing up dark green mucus. Taking vitamin d and c.

## 2021-03-01 ENCOUNTER — Encounter: Payer: Self-pay | Admitting: Family Medicine

## 2021-03-01 ENCOUNTER — Ambulatory Visit (INDEPENDENT_AMBULATORY_CARE_PROVIDER_SITE_OTHER): Payer: BC Managed Care – PPO | Admitting: Family Medicine

## 2021-03-01 ENCOUNTER — Other Ambulatory Visit: Payer: Self-pay

## 2021-03-01 VITALS — BP 124/80 | HR 73 | Ht 72.0 in | Wt 261.0 lb

## 2021-03-01 DIAGNOSIS — Z23 Encounter for immunization: Secondary | ICD-10-CM | POA: Diagnosis not present

## 2021-03-01 DIAGNOSIS — J069 Acute upper respiratory infection, unspecified: Secondary | ICD-10-CM

## 2021-03-01 DIAGNOSIS — I10 Essential (primary) hypertension: Secondary | ICD-10-CM | POA: Diagnosis not present

## 2021-03-01 DIAGNOSIS — R7989 Other specified abnormal findings of blood chemistry: Secondary | ICD-10-CM

## 2021-03-01 DIAGNOSIS — F330 Major depressive disorder, recurrent, mild: Secondary | ICD-10-CM

## 2021-03-01 DIAGNOSIS — F909 Attention-deficit hyperactivity disorder, unspecified type: Secondary | ICD-10-CM | POA: Insufficient documentation

## 2021-03-01 DIAGNOSIS — F902 Attention-deficit hyperactivity disorder, combined type: Secondary | ICD-10-CM

## 2021-03-01 MED ORDER — AMPHETAMINE-DEXTROAMPHET ER 20 MG PO CP24: 20.0000 mg | ORAL_CAPSULE | ORAL | 0 refills | Status: DC

## 2021-03-01 MED ORDER — SERTRALINE HCL 100 MG PO TABS: 100.0000 mg | ORAL_TABLET | Freq: Two times a day (BID) | ORAL | 1 refills | Status: DC

## 2021-03-01 NOTE — Progress Notes (Signed)
Established Patient Office Visit  Subjective:  Patient ID: Stephen Orozco, male    DOB: Nov 07, 1970  Age: 50 y.o. MRN: [id number]  CC:  Chief Complaint  Patient presents with  . ADD    HPI Stephen Orozco presents for   ADHD -we took over prescribing his medication recently.  We sent a prescription for Adderall 10 mg to be taken twice a day.  He says most the time he will actually take 2 tabs at 1 time or either just skip the middle of the day dose because of its inconvenience.  He was previously on 20 mg extended release and was happy with that regimen.  He is also here to follow-up on his mood and see if we can refill the sertraline 100 mg.  Would actually been writing that medication for him a couple years ago and then he was seeing psychiatry.  He would like [name] to take back over that prescription he is currently on 200 mg daily and tolerates that well he does need 90 days/refill sent to pharmacy.  Everyone had in his house has had some upper respiratory symptoms.  He himself has felt some mild symptoms but feels like overall he is doing okay.  No one has tested positive for COVID in the home.   Past Medical History:  Diagnosis Date  . ADHD   . Anxiety   . Arthritis   . Depression   . Hyperlipidemia   . Hypertension   . Sleep apnea    cpap    Past Surgical History:  Procedure Laterality Date  . APPENDECTOMY    . medial malleolar fixation Right    Ankle  . MENISCUS REPAIR Left 2010    Family History  Problem Relation Age of Onset  . Alcohol abuse Father   . Diabetes Father   . Hyperlipidemia Father   . Hypertension Father   . Gout Father   . Bipolar disorder Father   . Cancer Other   . Heart attack Other   . Depression Other   . Autism spectrum disorder Son   . ADD / ADHD Son   . Colon cancer Neg Hx   . Colon polyps Neg Hx   . Esophageal cancer Neg Hx   . Stomach cancer Neg Hx   . Rectal cancer Neg Hx     Social History   Socioeconomic History  .  Marital status: Married    Spouse name: Not on file  . Number of children: 2  . Years of education: Not on file  . Highest education level: Not on file  Occupational History  . Occupation: Pharmacist, hospital  Tobacco Use  . Smoking status: Former Smoker    Packs/day: 0.30    Years: 10.00    Pack years: 3.00    Types: Cigarettes    Quit date: 09/14/2012    Years since quitting: 8.4  . Smokeless tobacco: Never Used  Vaping Use  . Vaping Use: Never used  Substance and Sexual Activity  . Alcohol use: Yes    Alcohol/week: 2.0 - 3.0 standard drinks    Types: 2 - 3 Standard drinks or equivalent per week    Comment: Occasional use  . Drug use: No  . Sexual activity: Yes    Partners: Female    Birth control/protection: None, Surgical  Other Topics Concern  . Not on file  Social History Narrative   1 half caff drink daily. No regular exercise. Wife is a Patent examiner.  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 AMBULATORY NON FORMULARY MEDICATION Medication Name: *New start CPAP for OSA.  SEt to 12 cm water pressure. Large size [employer]kel Full Face Mask Simplus mask and heated humidification. Please fax download after one week.  Fax to [employer]. 1 Units 0  . fluticasone (FLONASE) 50 MCG/ACT nasal spray Place 2 sprays into both nostrils daily. 9.9 mL 2  . lisinopril (ZESTRIL) 10 MG tablet Take 1 tablet (10 mg total) by mouth daily. 90 tablet 0  . metaxalone (SKELAXIN) 400 MG tablet TAKE 1 to 1/2 TABLETS (200-400 MG TOTAL) BY MOUTH 2 (TWO) TIMES DAILY AS NEEDED For Back Pain. 30 tablet 1  . pravastatin (PRAVACHOL)  40 MG tablet TAKE 1 TABLET BY MOUTH EVERY DAY 90 tablet 3  . amphetamine-dextroamphetamine (ADDERALL) 10 MG tablet Take 1 tablet (10 mg total) by mouth 2 (two) times daily. 60 tablet 0  . sertraline (ZOLOFT) 100 MG tablet Take 100 mg by mouth 2 (two) times daily.    Marland Kitchen amoxicillin-clavulanate (AUGMENTIN) 875-125 MG tablet Take 1 tablet by mouth 2 (two) times daily. 14 tablet 0   No facility-administered medications prior to visit.    Allergies  Allergen Reactions  . Flexeril [Cyclobenzaprine] Other (See Comments)    Excess sedation  . Adhesive [Tape] Rash    ROS Review of Systems    Objective:    Physical Exam  BP 124/80   Pulse 73   Ht 6' (1.829 m)   Wt 261 lb (118.4 kg)   SpO2 100%   BMI 35.40 kg/m  Wt Readings from Last 3 Encounters:  03/01/21 261 lb (118.4 kg)  01/17/21 267 lb (121.1 kg)  12/27/20 269 lb (122 kg)     There are no preventive care reminders to display for this patient.  There are no preventive care reminders to display for this patient.  Lab Results  Component Value Date   TSH 1.73 02/21/2018   Lab Results  Component Value Date   WBC 5.8 09/07/2020   HGB 14.9 09/07/2020   HCT 42.7 09/07/2020   MCV 91.8 09/07/2020   PLT 148 09/07/2020   Lab Results  Component Value Date   NA 133 (L) 09/07/2020   K 4.4 09/07/2020   CO2 27 09/07/2020   GLUCOSE 107 (H) 09/07/2020   BUN 12 09/07/2020   CREATININE 0.97 09/07/2020   BILITOT 0.6 09/07/2020   ALKPHOS 57 02/15/2017   AST 26 09/07/2020   ALT 38 09/07/2020   PROT 6.9 09/07/2020   ALBUMIN 4.5 02/15/2017   CALCIUM 9.3 09/07/2020   Lab Results  Component Value Date   CHOL 217 (H) 09/07/2020   Lab Results  Component Value Date   HDL 38 (L) 09/07/2020   Lab Results  Component Value Date   LDLCALC 133 (H) 09/07/2020   Lab Results  Component Value Date   TRIG 323 (H) 09/07/2020   Lab Results  Component Value Date   CHOLHDL 5.7 (H) 09/07/2020   Lab Results  Component Value Date    HGBA1C 5.6 02/21/2018      Assessment &  Plan:   Problem List Items Addressed This Visit      Cardiovascular and Mediastinum   Essential hypertension, benign - Primary    Well controlled. Continue current regimen. Follow up in  6 mo       Relevant Orders   Basic Metabolic Panel (BMET)   PSA     Other   MDD (major depressive disorder)    Stable on sertraline 200 mg daily.  Refills sent to pharmacy.  Follow-up in 6 months.  Okay to follow-up sooner if having any concerns.      Relevant Medications   sertraline (ZOLOFT) 100 MG tablet   Low testosterone   Relevant Orders   Basic Metabolic Panel (BMET)   PSA   ADHD    Switch back to extended release which is what he was previously receiving check the PDMP and prescribing as appropriate.  Adderall 20 mg XR sent to pharmacy for 90-day capsules if his insurance will not cover 90-day then we can switch it to 3-30-day prescriptions.       Other Visit Diagnoses    Need for Zostavax administration       Relevant Orders   Varicella-zoster vaccine IM (Shingrix) (Completed)   Viral upper respiratory tract infection          URI-recommend symptomatic treatment.  Call if not better by the end of the week.  Meds ordered this encounter  Medications  . amphetamine-dextroamphetamine (ADDERALL XR) 20 MG 24 hr capsule    Sig: Take 1 capsule (20 mg total) by mouth every morning.    Dispense:  90 capsule    Refill:  0  . sertraline (ZOLOFT) 100 MG tablet    Sig: Take 1 tablet (100 mg total) by mouth 2 (two) times daily.    Dispense:  180 tablet    Refill:  1    Follow-up: Return in about 6 months (around 09/01/2021) for ADHD and mood medication .    Beatrice Lecher, MD

## 2021-03-01 NOTE — Assessment & Plan Note (Signed)
Stable on sertraline 200 mg daily.  Refills sent to pharmacy.  Follow-up in 6 months.  Okay to follow-up sooner if having any concerns.

## 2021-03-01 NOTE — Assessment & Plan Note (Signed)
Well controlled. Continue current regimen. Follow up in  6 mo  

## 2021-03-01 NOTE — Assessment & Plan Note (Signed)
Switch back to extended release which is what he was previously receiving check the PDMP and prescribing as appropriate.  Adderall 20 mg XR sent to pharmacy for 90-day capsules if his insurance will not cover 90-day then we can switch it to 3-30-day prescriptions.

## 2021-03-02 ENCOUNTER — Encounter: Payer: Self-pay | Admitting: Family Medicine

## 2021-03-02 LAB — BASIC METABOLIC PANEL
BUN: 12 mg/dL (ref 7–25)
CO2: 27 mmol/L (ref 20–32)
Calcium: 9.6 mg/dL (ref 8.6–10.3)
Chloride: 99 mmol/L (ref 98–110)
Creat: 0.86 mg/dL (ref 0.70–1.33)
Glucose, Bld: 96 mg/dL (ref 65–139)
Potassium: 4.6 mmol/L (ref 3.5–5.3)
Sodium: 136 mmol/L (ref 135–146)

## 2021-03-02 LAB — PSA: PSA: 0.21 ng/mL (ref <=4.00)

## 2021-03-02 NOTE — Progress Notes (Signed)
All labs are normal. 

## 2021-04-29 ENCOUNTER — Other Ambulatory Visit: Payer: Self-pay | Admitting: Physician Assistant

## 2021-04-29 DIAGNOSIS — J3089 Other allergic rhinitis: Secondary | ICD-10-CM

## 2021-04-29 DIAGNOSIS — R0989 Other specified symptoms and signs involving the circulatory and respiratory systems: Secondary | ICD-10-CM

## 2021-04-29 DIAGNOSIS — R6889 Other general symptoms and signs: Secondary | ICD-10-CM

## 2021-07-09 ENCOUNTER — Other Ambulatory Visit: Payer: Self-pay | Admitting: Family Medicine

## 2021-07-10 MED ORDER — AMPHETAMINE-DEXTROAMPHET ER 20 MG PO CP24: 20.0000 mg | ORAL_CAPSULE | ORAL | 0 refills | Status: DC

## 2021-07-28 ENCOUNTER — Other Ambulatory Visit: Payer: Self-pay | Admitting: Physician Assistant

## 2021-07-28 ENCOUNTER — Other Ambulatory Visit: Payer: Self-pay | Admitting: Family Medicine

## 2021-07-28 DIAGNOSIS — J3089 Other allergic rhinitis: Secondary | ICD-10-CM

## 2021-07-28 DIAGNOSIS — I10 Essential (primary) hypertension: Secondary | ICD-10-CM

## 2021-07-28 DIAGNOSIS — R0989 Other specified symptoms and signs involving the circulatory and respiratory systems: Secondary | ICD-10-CM

## 2021-09-01 ENCOUNTER — Encounter: Payer: Self-pay | Admitting: Family Medicine

## 2021-09-01 ENCOUNTER — Other Ambulatory Visit: Payer: Self-pay

## 2021-09-01 ENCOUNTER — Ambulatory Visit: Payer: BC Managed Care – PPO | Admitting: Family Medicine

## 2021-09-01 VITALS — BP 133/74 | HR 94 | Ht 72.0 in | Wt 264.0 lb

## 2021-09-01 DIAGNOSIS — Z23 Encounter for immunization: Secondary | ICD-10-CM

## 2021-09-01 DIAGNOSIS — F902 Attention-deficit hyperactivity disorder, combined type: Secondary | ICD-10-CM | POA: Diagnosis not present

## 2021-09-01 DIAGNOSIS — I10 Essential (primary) hypertension: Secondary | ICD-10-CM | POA: Diagnosis not present

## 2021-09-01 DIAGNOSIS — E785 Hyperlipidemia, unspecified: Secondary | ICD-10-CM

## 2021-09-01 DIAGNOSIS — F330 Major depressive disorder, recurrent, mild: Secondary | ICD-10-CM

## 2021-09-01 DIAGNOSIS — N529 Male erectile dysfunction, unspecified: Secondary | ICD-10-CM

## 2021-09-01 MED ORDER — TADALAFIL 20 MG PO TABS: 20.0000 mg | ORAL_TABLET | Freq: Every day | ORAL | 5 refills | Status: DC | PRN

## 2021-09-01 MED ORDER — AMPHETAMINE-DEXTROAMPHET ER 20 MG PO CP24: 20.0000 mg | ORAL_CAPSULE | ORAL | 0 refills | Status: DC

## 2021-09-01 NOTE — Assessment & Plan Note (Signed)
Well controlled. Continue current regimen. Follow up in  6 mo.  Due for refill for December.

## 2021-09-01 NOTE — Assessment & Plan Note (Signed)
Well controlled. Continue current regimen. Follow up in  6 mo. Due for labs.  

## 2021-09-01 NOTE — Assessment & Plan Note (Addendum)
Happy with current regimen.  PHQ-9 score 9.  And GAD-7 score of 4.  No thoughts of wanting to harm himself.  Continue current regimen.  We will make sure he has refills.

## 2021-09-01 NOTE — Assessment & Plan Note (Signed)
ED questionnaire score of 10.  He is on an SSRI which certainly could contribute.  For now we will do a trial of Cialis.  Prescription sent to pharmacy.  Did warn about potential side effects.  Call if any problems concerns or ineffective.

## 2021-09-01 NOTE — Progress Notes (Signed)
Established Patient Office Visit  Subjective:  Patient ID: Stephen Orozco, male    DOB: [birth year]  Age: 50 y.o. MRN: [id number]  CC:  Chief Complaint  Patient presents with   ADHD    HPI [name] presents for   ADHD - Reports symptoms are well controlled on current regime. Denies any problems with insomnia, chest pain, palpitations, or SOB.     Hypertension- Pt denies chest pain, SOB, dizziness, or heart palpitations.  Taking meds as directed w/o problems.  Denies medication side effects.    F/U MDD -overall he feels like he is doing well.  He has new job and he is enjoying it.  He also complains of some right shoulder pain.  He had an injury years ago before he even moved into the local area. He had imaging and it was evaluated at that time.  He says ever since then he has had some pain on and off it normally last just 2 to 3 days he is able to apply an over-the-counter topical rub and do some stretches and he usually gets relief.  But recently after he had COVID.  The pain started and persisted for over a month.  He says it finally started to improve about a week or so ago.  That is the first time it had lasted that long.  He also wanted to discuss his concerns around erectile dysfunction which has been going on for quite some time.  He feels like his mood is in a good place right now and feels like his sex drive is normal.  He just has difficulty with maintaining erection.  He is tried some over-the-counter products but nothing prescription.  Past Medical History:  Diagnosis Date   ADHD    Anxiety    Arthritis    Depression    Hyperlipidemia    Hypertension    Sleep apnea    cpap    Past Surgical History:  Procedure Laterality Date   APPENDECTOMY     medial malleolar fixation Right    Ankle   MENISCUS REPAIR Left 2010    Family History  Problem Relation Age of Onset   Alcohol abuse Father    Diabetes Father    Hyperlipidemia Father    Hypertension  Father    Gout Father    Bipolar disorder Father    Cancer Other    Heart attack Other    Depression Other    Autism spectrum disorder Son    ADD / ADHD Son    Colon cancer Neg Hx    Colon polyps Neg Hx    Esophageal cancer Neg Hx    Stomach cancer Neg Hx    Rectal cancer Neg Hx     Social History   Socioeconomic History   Marital status: Married    Spouse name: Not on file   Number of children: 2   Years of education: Not on file   Highest education level: Not on file  Occupational History   Occupation: Teacher  Tobacco Use   Smoking status: Former    Packs/day: 0.30    Years: 10.00    Pack years: 3.00    Types: Cigarettes    Quit date: 09/14/2012    Years since quitting: 8.9   Smokeless tobacco: Never  Vaping Use   Vaping Use: Never used  Substance and Sexual Activity   Alcohol use: Yes    Alcohol/week: 2.0 - 3.0 standard drinks    Types: 2 -  3 Standard drinks or equivalent per week    Comment: Occasional use   Drug use: No   Sexual activity: Yes    Partners: Female    Birth control/protection: None, Surgical  Other Topics Concern   Not on file  Social History Narrative   1 half caff drink daily. No regular exercise. Wife is a Patent examiner.  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  SEt to 12 cm water pressure. Large size [employer]kel Full Face Mask Simplus mask and heated humidification. Please fax download after one week.  Fax to [facility]. 1 Units 0   fluticasone (FLONASE) 50 MCG/ACT nasal spray  SPRAY 2 SPRAYS INTO EACH NOSTRIL EVERY DAY 48 mL 0   lisinopril (ZESTRIL) 10 MG tablet TAKE 1 TABLET BY MOUTH EVERY DAY 90 tablet 0   metaxalone (SKELAXIN) 400 MG tablet TAKE 1 to 1/2 TABLETS (200-400 MG TOTAL) BY MOUTH 2 (TWO) TIMES DAILY AS NEEDED For Back Pain. 30 tablet 1   pravastatin (PRAVACHOL) 40 MG tablet TAKE 1 TABLET BY MOUTH EVERY DAY 90 tablet 3   sertraline (ZOLOFT) 100 MG tablet Take 1 tablet (100 mg total) by mouth 2 (two) times daily. 180 tablet 1   triamcinolone cream (KENALOG) 0.1 % Apply topically 2 (two) times daily.  2   amphetamine-dextroamphetamine (ADDERALL XR) 20 MG 24 hr capsule Take 1 capsule (20 mg total) by mouth every morning. 90 capsule 0   No facility-administered medications prior to visit.    Allergies  Allergen Reactions   Flexeril [Cyclobenzaprine] Other (See Comments)    Excess sedation   Adhesive [Tape] Rash    ROS Review of Systems    Objective:    Physical Exam Constitutional:      Appearance: Normal appearance. He is well-developed.  HENT:     Head: Normocephalic and atraumatic.  Cardiovascular:     Rate and Rhythm: Normal rate and regular rhythm.     Heart sounds: Normal heart sounds.  Pulmonary:     Effort: Pulmonary effort is normal.     Breath sounds: Normal breath sounds.  Skin:    General: Skin is warm and dry.  Neurological:     Mental Status: He is alert and oriented to person, place, and time. Mental status is at baseline.  Psychiatric:        Behavior: Behavior normal.    BP 133/74   Pulse 94   Ht 6' (1.829 m)   Wt 264 lb (119.7 kg)   SpO2 99%   BMI 35.80 kg/m  Wt Readings from Last 3 Encounters:  09/01/21 264 lb (119.7 kg)  03/01/21 261 lb (118.4 kg)  01/17/21 267 lb (121.1 kg)     Health Maintenance Due  Topic Date Due   Pneumococcal Vaccine 23-14 Years old (2 - PCV) 06/03/2013   COVID-19 Vaccine (4 - Booster for Pfizer series) 09/26/2020    There are no preventive care reminders to display for this  patient.  Lab Results  Component Value Date   TSH 1.73 02/21/2018  Lab Results  Component Value Date   WBC 5.8 09/07/2020   HGB 14.9 09/07/2020   HCT 42.7 09/07/2020   MCV 91.8 09/07/2020   PLT 148 09/07/2020   Lab Results  Component Value Date   NA 136 03/01/2021   K 4.6 03/01/2021   CO2 27 03/01/2021   GLUCOSE 96 03/01/2021   BUN 12 03/01/2021   CREATININE 0.86 03/01/2021   BILITOT 0.6 09/07/2020   ALKPHOS 57 02/15/2017   AST 26 09/07/2020   ALT 38 09/07/2020   PROT 6.9 09/07/2020   ALBUMIN 4.5 02/15/2017   CALCIUM 9.6 03/01/2021   Lab Results  Component Value Date   CHOL 217 (H) 09/07/2020   Lab Results  Component Value Date   HDL 38 (L) 09/07/2020   Lab Results  Component Value Date   LDLCALC 133 (H) 09/07/2020   Lab Results  Component Value Date   TRIG 323 (H) 09/07/2020   Lab Results  Component Value Date   CHOLHDL 5.7 (H) 09/07/2020   Lab Results  Component Value Date   HGBA1C 5.6 02/21/2018      Assessment & Plan:   Problem List Items Addressed This Visit       Cardiovascular and Mediastinum   Essential hypertension, benign - Primary    Well controlled. Continue current regimen. Follow up in  6 mo. Due for labs.        Relevant Medications   tadalafil (CIALIS) 20 MG tablet   Other Relevant Orders   Lipid panel   COMPLETE METABOLIC PANEL WITH GFR     Other   MDD (major depressive disorder)    Happy with current regimen.  PHQ-9 score 9.  And GAD-7 score of 4.  No thoughts of wanting to harm himself.  Continue current regimen.  We will make sure he has refills.      Hyperlipidemia   Relevant Medications   tadalafil (CIALIS) 20 MG tablet   Other Relevant Orders   Lipid panel   COMPLETE METABOLIC PANEL WITH GFR   Erectile dysfunction    ED questionnaire score of 10.  He is on an SSRI which certainly could contribute.  For now we will do a trial of Cialis.  Prescription sent to pharmacy.  Did warn about potential side effects.   Call if any problems concerns or ineffective.      Relevant Medications   tadalafil (CIALIS) 20 MG tablet   ADHD    Well controlled. Continue current regimen. Follow up in  6 mo.  Due for refill for December.       Other Visit Diagnoses     Need for immunization against influenza       Relevant Orders   Flu Vaccine QUAD 44mo+IM (Fluarix, Fluzone & Alfiuria Quad PF) (Completed)   Need for Zostavax administration       Relevant Orders   Varicella-zoster vaccine IM (Shingrix) (Completed)       Meds ordered this encounter  Medications   tadalafil (CIALIS) 20 MG tablet    Sig: Take 1 tablet (20 mg total) by mouth daily as needed for erectile dysfunction.    Dispense:  10 tablet    Refill:  5   amphetamine-dextroamphetamine (ADDERALL XR) 20 MG 24 hr capsule    Sig: Take 1 capsule (20 mg total) by mouth every morning.    Dispense:  90 capsule    Refill:  0     Follow-up: Return in about 6 months (around 03/01/2022) for ADHD  and medications .    Beatrice Lecher, MD

## 2021-09-16 ENCOUNTER — Other Ambulatory Visit: Payer: Self-pay | Admitting: Family Medicine

## 2021-09-16 DIAGNOSIS — E785 Hyperlipidemia, unspecified: Secondary | ICD-10-CM

## 2021-09-28 ENCOUNTER — Other Ambulatory Visit: Payer: Self-pay | Admitting: Family Medicine

## 2021-10-18 ENCOUNTER — Other Ambulatory Visit: Payer: Self-pay | Admitting: Family Medicine

## 2021-10-18 DIAGNOSIS — I10 Essential (primary) hypertension: Secondary | ICD-10-CM

## 2021-10-30 ENCOUNTER — Encounter: Payer: Self-pay | Admitting: Family Medicine

## 2021-10-30 ENCOUNTER — Other Ambulatory Visit: Payer: Self-pay | Admitting: Family Medicine

## 2021-10-30 DIAGNOSIS — I10 Essential (primary) hypertension: Secondary | ICD-10-CM

## 2021-10-30 DIAGNOSIS — E785 Hyperlipidemia, unspecified: Secondary | ICD-10-CM

## 2021-10-30 DIAGNOSIS — F330 Major depressive disorder, recurrent, mild: Secondary | ICD-10-CM

## 2021-10-31 ENCOUNTER — Encounter: Payer: Self-pay | Admitting: Family Medicine

## 2021-10-31 MED ORDER — SERTRALINE HCL 100 MG PO TABS: 100.0000 mg | ORAL_TABLET | Freq: Two times a day (BID) | ORAL | 1 refills | Status: DC

## 2021-10-31 MED ORDER — PRAVASTATIN SODIUM 40 MG PO TABS: 40.0000 mg | ORAL_TABLET | Freq: Every day | ORAL | 3 refills | Status: DC

## 2021-10-31 MED ORDER — AMPHETAMINE-DEXTROAMPHET ER 20 MG PO CP24: 20.0000 mg | ORAL_CAPSULE | ORAL | 0 refills | Status: DC

## 2021-10-31 MED ORDER — LISINOPRIL 10 MG PO TABS: 10.0000 mg | ORAL_TABLET | Freq: Every day | ORAL | 1 refills | Status: DC

## 2021-10-31 NOTE — Telephone Encounter (Signed)
Meds ordered this encounter  Medications   amphetamine-dextroamphetamine (ADDERALL XR) 20 MG 24 hr capsule    Sig: Take 1 capsule (20 mg total) by mouth every morning.    Dispense:  90 capsule    Refill:  0   lisinopril (ZESTRIL) 10 MG tablet    Sig: Take 1 tablet (10 mg total) by mouth daily.    Dispense:  90 tablet    Refill:  1   pravastatin (PRAVACHOL) 40 MG tablet    Sig: Take 1 tablet (40 mg total) by mouth daily.    Dispense:  90 tablet    Refill:  3   sertraline (ZOLOFT) 100 MG tablet    Sig: Take 1 tablet (100 mg total) by mouth 2 (two) times daily.    Dispense:  180 tablet    Refill:  1

## 2022-03-01 ENCOUNTER — Encounter: Payer: Self-pay | Admitting: Family Medicine

## 2022-03-01 ENCOUNTER — Ambulatory Visit: Payer: BC Managed Care – PPO | Admitting: Family Medicine

## 2022-03-01 VITALS — BP 139/68 | HR 92 | Resp 18 | Ht 72.0 in | Wt 273.0 lb

## 2022-03-01 DIAGNOSIS — E785 Hyperlipidemia, unspecified: Secondary | ICD-10-CM | POA: Diagnosis not present

## 2022-03-01 DIAGNOSIS — R0602 Shortness of breath: Secondary | ICD-10-CM | POA: Diagnosis not present

## 2022-03-01 DIAGNOSIS — I10 Essential (primary) hypertension: Secondary | ICD-10-CM | POA: Diagnosis not present

## 2022-03-01 DIAGNOSIS — F330 Major depressive disorder, recurrent, mild: Secondary | ICD-10-CM

## 2022-03-01 DIAGNOSIS — G4733 Obstructive sleep apnea (adult) (pediatric): Secondary | ICD-10-CM

## 2022-03-01 DIAGNOSIS — F902 Attention-deficit hyperactivity disorder, combined type: Secondary | ICD-10-CM | POA: Diagnosis not present

## 2022-03-01 DIAGNOSIS — R5383 Other fatigue: Secondary | ICD-10-CM

## 2022-03-01 MED ORDER — AMPHETAMINE-DEXTROAMPHET ER 20 MG PO CP24: 20.0000 mg | ORAL_CAPSULE | ORAL | 0 refills | Status: DC

## 2022-03-01 MED ORDER — LISINOPRIL 10 MG PO TABS: 10.0000 mg | ORAL_TABLET | Freq: Every day | ORAL | 1 refills | Status: DC

## 2022-03-01 MED ORDER — PRAVASTATIN SODIUM 40 MG PO TABS: 40.0000 mg | ORAL_TABLET | Freq: Every day | ORAL | 3 refills | Status: DC

## 2022-03-01 MED ORDER — SERTRALINE HCL 100 MG PO TABS: 100.0000 mg | ORAL_TABLET | Freq: Two times a day (BID) | ORAL | 1 refills | Status: DC

## 2022-03-01 NOTE — Assessment & Plan Note (Signed)
Happy with current regimen.  Refill sent to pharmacy for 90-day supply.

## 2022-03-01 NOTE — Assessment & Plan Note (Signed)
Very consistent with wearing his CPAP every night.

## 2022-03-01 NOTE — Assessment & Plan Note (Signed)
Well controlled. Continue current regimen. Follow up in  6 mo  

## 2022-03-01 NOTE — Progress Notes (Addendum)
Established Patient Office Visit  Subjective   Patient ID: Stephen Orozco, male    DOB: 01/16/1971  Age: 51 y.o. MRN: [id number]  Chief Complaint  Patient presents with   ADHD   Low Energy     1 year     HPI  Hypertension- Pt denies chest pain, SOB, dizziness, or heart palpitations.  Taking meds as directed w/o problems.  Denies medication side effects.    ADD - Reports symptoms are well controlled on current regime. Denies any problems with insomnia, chest pain, palpitations, or SOB.    F/U MDD -he feels like overall he is doing well on the sertraline 200 mg daily.  He says sometimes things feel little bit flat but it definitely helps with some of the mood and irritability.  He also just complains of fatigue.  He is just noticed that he is getting more tired with certain activities like working on the yard he actually has to stop and rest.  He says occasionally he will feel little winded or short of breath but not persistently so.  No chest pain.  He feels like he sleeps and rest well.  If anything he might sleep a little too much he can fall asleep easily.  He does have sleep apnea but says he is wearing his CPAP very consistently.  He also reports that he really eats very healthy.  He just has not been able to start exercising because he just says he feels so tired that he really has not had the motivation to go.    ROS    Objective:     BP 139/68   Pulse 92   Resp 18   Ht 6' (1.829 m)   Wt 273 lb (123.8 kg)   SpO2 98%   BMI 37.03 kg/m    Physical Exam Constitutional:      Appearance: He is well-developed.  HENT:     Head: Normocephalic and atraumatic.  Cardiovascular:     Rate and Rhythm: Normal rate and regular rhythm.     Heart sounds: Normal heart sounds.  Pulmonary:     Effort: Pulmonary effort is normal.     Breath sounds: Normal breath sounds.  Skin:    General: Skin is warm and dry.  Neurological:     Mental Status: He is alert and oriented to  person, place, and time.  Psychiatric:        Behavior: Behavior normal.    No results found for any visits on 03/01/22.    The 10-year ASCVD risk score (Arnett DK, et al., 2019) is: 7.6%    Assessment & Plan:   Problem List Items Addressed This Visit       Cardiovascular and Mediastinum   Essential hypertension, benign    Well controlled. Continue current regimen. Follow up in  6 mo        Relevant Medications   lisinopril (ZESTRIL) 10 MG tablet   pravastatin (PRAVACHOL) 40 MG tablet     Respiratory   OSA (obstructive sleep apnea)    Very consistent with wearing his CPAP every night.         Other   MDD (major depressive disorder)   Relevant Medications   sertraline (ZOLOFT) 100 MG tablet   Hyperlipidemia   Relevant Medications   lisinopril (ZESTRIL) 10 MG tablet   pravastatin (PRAVACHOL) 40 MG tablet   ADHD - Primary    Happy with current regimen.  Refill sent to pharmacy for 90-day supply.  Relevant Medications   amphetamine-dextroamphetamine (ADDERALL XR) 20 MG 24 hr capsule   Other Visit Diagnoses     Other fatigue       Relevant Orders   Lipid Panel w/reflex Direct LDL   COMPLETE METABOLIC PANEL WITH GFR   CBC   PSA   B12   Fe+TIBC+Fer   SOB (shortness of breath)       Relevant Orders   Lipid Panel w/reflex Direct LDL   COMPLETE METABOLIC PANEL WITH GFR   CBC   PSA   B12   Fe+TIBC+Fer       Fatigue -check for anemia and thyroid disorder.  Check for electrolyte disturbance and liver or renal changes.  I like to get an EKG today.  And schedule for spirometry.  Is a former smoker.  Shortness of breath-even though he is not having any specific chest pain I did go ahead and get an EKG today as he does notice the fatigue and shortness of breath more with activity.  EKG shows rate of 76 bpm, normal sinus rhythm with no acute ST-T wave changes.  He does have some poor R wave progression in the lateral leads.  Return in about 4 weeks  (around 03/29/2022) for spirometry .    Beatrice Lecher, MD

## 2022-03-01 NOTE — Addendum Note (Signed)
Addended by: Beatrice Lecher D on: 03/01/2022 01:06 PM   Modules accepted: Orders

## 2022-03-05 ENCOUNTER — Ambulatory Visit (INDEPENDENT_AMBULATORY_CARE_PROVIDER_SITE_OTHER): Payer: BC Managed Care – PPO | Admitting: Physician Assistant

## 2022-03-05 ENCOUNTER — Encounter: Payer: Self-pay | Admitting: Physician Assistant

## 2022-03-05 VITALS — BP 110/85 | HR 73 | Ht 72.0 in | Wt 268.0 lb

## 2022-03-05 DIAGNOSIS — R509 Fever, unspecified: Secondary | ICD-10-CM | POA: Diagnosis not present

## 2022-03-05 DIAGNOSIS — J029 Acute pharyngitis, unspecified: Secondary | ICD-10-CM

## 2022-03-05 DIAGNOSIS — Z20818 Contact with and (suspected) exposure to other bacterial communicable diseases: Secondary | ICD-10-CM

## 2022-03-05 DIAGNOSIS — R590 Localized enlarged lymph nodes: Secondary | ICD-10-CM

## 2022-03-05 LAB — POCT RAPID STREP A (OFFICE): Rapid Strep A Screen: NEGATIVE

## 2022-03-05 MED ORDER — AMOXICILLIN-POT CLAVULANATE 875-125 MG PO TABS: 1.0000 | ORAL_TABLET | Freq: Two times a day (BID) | ORAL | 0 refills | Status: DC

## 2022-03-05 NOTE — Progress Notes (Signed)
Acute Office Visit  Subjective:     Patient ID: Stephen Orozco, male    DOB: 1971/03/22, 51 y.o.   MRN: [id number]  Chief Complaint  Patient presents with   Sore Throat    HPI Patient is in today for sore throat. He started feeling bad last week and has gotten worse. His son was diagnosed with strep throat. Pt had fever of 101 and body aches and chills. No cough, SOB or congestion. He feels "run down". Taking tylenol and ibuprofen and helps some.   .. Active Ambulatory Problems    Diagnosis Date Noted   Anxiety 02/26/2012   Essential hypertension, benign 02/26/2012   Obesity, Class II, BMI 35-39.9, with comorbidity 04/22/2012   Hyperlipidemia 11/20/2013   MDD (major depressive disorder) 02/13/2018   OSA (obstructive sleep apnea) 03/17/2018   History of DVT (deep vein thrombosis) 03/17/2018   Low testosterone 10/02/2018   Environmental and seasonal allergies 12/27/2020   Throat clearing 12/27/2020   ADHD 03/01/2021   Erectile dysfunction 09/01/2021   Resolved Ambulatory Problems    Diagnosis Date Noted   Other and unspecified hyperlipidemia 04/22/2012   Tobacco abuse 11/20/2013   Past Medical History:  Diagnosis Date   Arthritis    Depression    Hypertension    Sleep apnea      ROS See HPI.      Objective:    BP 110/85   Pulse 73   Ht 6' (1.829 m)   Wt 268 lb (121.6 kg)   SpO2 99%   BMI 36.35 kg/m  BP Readings from Last 3 Encounters:  03/05/22 110/85  03/01/22 139/68  09/01/21 133/74   Wt Readings from Last 3 Encounters:  03/05/22 268 lb (121.6 kg)  03/01/22 273 lb (123.8 kg)  09/01/21 264 lb (119.7 kg)      Physical Exam Vitals reviewed.  Constitutional:      Appearance: He is well-developed. He is obese.  HENT:     Head: Normocephalic.     Right Ear: Tympanic membrane and ear canal normal. No drainage, swelling or tenderness. No middle ear effusion. Tympanic membrane is not erythematous.     Left Ear: Tympanic membrane and ear canal  normal. No drainage, swelling or tenderness.  No middle ear effusion. Tympanic membrane is not erythematous.     Nose: No congestion or rhinorrhea.     Mouth/Throat:     Mouth: Mucous membranes are moist.     Pharynx: Uvula midline. Posterior oropharyngeal erythema present. No oropharyngeal exudate.     Tonsils: No tonsillar exudate or tonsillar abscesses. 1+ on the right. 1+ on the left.  Eyes:     Conjunctiva/sclera: Conjunctivae normal.     Pupils: Pupils are equal, round, and reactive to light.  Cardiovascular:     Rate and Rhythm: Normal rate and regular rhythm.  Pulmonary:     Effort: Pulmonary effort is normal.     Breath sounds: Normal breath sounds.  Musculoskeletal:     Cervical back: Normal range of motion and neck supple.  Lymphadenopathy:     Cervical: Cervical adenopathy present.  Neurological:     General: No focal deficit present.     Mental Status: He is alert.  Psychiatric:        Mood and Affect: Mood normal.         Assessment & Plan:  Marland KitchenMarland KitchenManu was seen today for sore throat.  Diagnoses and all orders for this visit:  Sore throat -  amoxicillin-clavulanate (AUGMENTIN) 875-125 MG tablet; Take 1 tablet by mouth 2 (two) times daily. -     POCT rapid strep A  Exposure to strep throat -     amoxicillin-clavulanate (AUGMENTIN) 875-125 MG tablet; Take 1 tablet by mouth 2 (two) times daily. -     POCT rapid strep A  Cervical adenopathy -     amoxicillin-clavulanate (AUGMENTIN) 875-125 MG tablet; Take 1 tablet by mouth 2 (two) times daily. -     POCT rapid strep A  Fever, unspecified fever cause -     amoxicillin-clavulanate (AUGMENTIN) 875-125 MG tablet; Take 1 tablet by mouth 2 (two) times daily. -     POCT rapid strep A   Centor Criteria is pretty high with lymphadenopathy, exposure to strep, fever, tonsil swelling Strep is negative Treated empirically with augmentin Symptomatic care given and ok to take ibuprofen/tylenol Ok to go back to work  in 24 hours after starting abx Replace toothbrush etc.  Follow up as needed and if symptoms persist or worsen.   Iran Planas, PA-C

## 2022-03-05 NOTE — Patient Instructions (Signed)
Sore Throat A sore throat is pain, burning, irritation, or scratchiness in the throat. When you have a sore throat, you may feel pain or tenderness in your throat when you swallow or talk. Many things can cause a sore throat, including: An infection. Seasonal allergies. Dryness in the air. Irritants, such as smoke or pollution. Radiation treatment for cancer. Gastroesophageal reflux disease (GERD). A tumor. A sore throat is often the first sign of another sickness. It may happen with other symptoms, such as coughing, sneezing, fever, and swollen neck glands. Most sore throats go away without medical treatment. Follow these instructions at home:     Medicines Take over-the-counter and prescription medicines only as told by your health care provider. Children often get sore throats. Do not give your child aspirin because of the association with Reye's syndrome. Use throat sprays to soothe your throat as told by your health care provider. Managing pain To help with pain, try: Sipping warm liquids, such as broth, herbal tea, or warm water. Eating or drinking cold or frozen liquids, such as frozen ice pops. Gargling with a mixture of salt and water 3-4 times a day or as needed. To make salt water, completely dissolve -1 tsp (3-6 g) of salt in 1 cup (237 mL) of warm water. Sucking on hard candy or throat lozenges. Putting a cool-mist humidifier in your bedroom at night to moisten the air. Sitting in the bathroom with the door closed for 5-10 minutes while you run hot water in the shower. General instructions Do not use any products that contain nicotine or tobacco. These products include cigarettes, chewing tobacco, and vaping devices, such as e-cigarettes. If you need help quitting, ask your health care provider. Rest as needed. Drink enough fluid to keep your urine pale yellow. Wash your hands often with soap and water for at least 20 seconds. If soap and water are not available, use hand  sanitizer. Contact a health care provider if: You have a fever for more than 2-3 days. You have symptoms that last for more than 2-3 days. Your throat does not get better within 7 days. You have a fever and your symptoms suddenly get worse. Get help right away if: You have difficulty breathing. You cannot swallow fluids, soft foods, or your saliva. You have increased swelling in your throat or neck. You have persistent nausea and vomiting. These symptoms may represent a serious problem that is an emergency. Do not wait to see if the symptoms will go away. Get medical help right away. Call your local emergency services (911 in the [place] Do not drive yourself to the hospital. Summary A sore throat is pain, burning, irritation, or scratchiness in the throat. Many things can cause a sore throat. Take over-the-counter medicines only as told by your health care provider. Rest as needed. Drink enough fluid to keep your urine pale yellow. Contact a health care provider if your throat does not get better within 7 days. This information is not intended to replace advice given to you by your health care provider. Make sure you discuss any questions you have with your health care provider. Document Revised: 12/28/2020 Document Reviewed: 12/28/2020 Elsevier Patient Education  [employer].

## 2022-03-07 ENCOUNTER — Encounter: Payer: Self-pay | Admitting: Family Medicine

## 2022-03-07 DIAGNOSIS — Z6836 Body mass index (BMI) 36.0-36.9, adult: Secondary | ICD-10-CM

## 2022-03-08 NOTE — Telephone Encounter (Signed)
Okay to place referral.  Orders were already placed.  EKG is okay

## 2022-03-29 ENCOUNTER — Ambulatory Visit (INDEPENDENT_AMBULATORY_CARE_PROVIDER_SITE_OTHER): Payer: BC Managed Care – PPO | Admitting: Family Medicine

## 2022-03-29 VITALS — BP 120/66 | HR 83 | Ht 72.0 in | Wt 266.0 lb

## 2022-03-29 DIAGNOSIS — R0602 Shortness of breath: Secondary | ICD-10-CM | POA: Diagnosis not present

## 2022-03-29 DIAGNOSIS — R5383 Other fatigue: Secondary | ICD-10-CM | POA: Diagnosis not present

## 2022-03-29 MED ORDER — ALBUTEROL SULFATE HFA 108 (90 BASE) MCG/ACT IN AERS
2.0000 | INHALATION_SPRAY | Freq: Once | RESPIRATORY_TRACT | Status: AC
  Administered 2022-03-29: 2 via RESPIRATORY_TRACT

## 2022-03-29 NOTE — Progress Notes (Signed)
   Established Patient Office Visit  Subjective   Patient ID: Stephen Orozco, male    DOB: October 06, 1971  Age: 51 y.o. MRN: [id number]  Chief Complaint  Patient presents with   Shortness of Breath    HPI  Here today for spirometry for shortness of breath and fatigue.  He had been noticing that he just started to feel more tired especially with certain activities.  He would feel little winded.  But no severe or symptoms.  He does wear his CPAP consistently and has obstructive sleep apnea..    ROS    Objective:     BP 120/66   Pulse 83   Ht 6' (1.829 m)   Wt 266 lb (120.7 kg)   SpO2 97%   BMI 36.08 kg/m    Physical Exam Vitals reviewed.  Constitutional:      Appearance: He is well-developed.  HENT:     Head: Normocephalic and atraumatic.  Eyes:     Conjunctiva/sclera: Conjunctivae normal.  Cardiovascular:     Rate and Rhythm: Normal rate.  Pulmonary:     Effort: Pulmonary effort is normal.  Skin:    General: Skin is dry.     Coloration: Skin is not pale.  Neurological:     Mental Status: He is alert and oriented to person, place, and time.  Psychiatric:        Behavior: Behavior normal.      No results found for any visits on 03/29/22.    The 10-year ASCVD risk score (Arnett DK, et al., 2019) is: 5.9%    Assessment & Plan:   Problem List Items Addressed This Visit   None Visit Diagnoses     SOB (shortness of breath)    -  Primary   Relevant Medications   albuterol (VENTOLIN HFA) 108 (90 Base) MCG/ACT inhaler 2 puff (Completed)   Other Relevant Orders   PR EVAL OF BRONCHOSPASM   Other fatigue          Shortness of breath-spirometry was normal today. Reviewed results today.   FVC of 86%, FEV1 of 91% with a ratio of 81% no significant change after albuterol. Gave reassurance.  Work on gradually increasing exercise and activity level.    Fatigue - will get additional labs in next couple of weeks.  Printed lab slip to go.   No follow-ups on file.     Beatrice Lecher, MD

## 2022-04-30 ENCOUNTER — Encounter: Payer: Self-pay | Admitting: Family Medicine

## 2022-05-08 ENCOUNTER — Ambulatory Visit: Payer: BC Managed Care – PPO | Admitting: Family Medicine

## 2022-05-08 ENCOUNTER — Encounter: Payer: Self-pay | Admitting: Family Medicine

## 2022-05-08 VITALS — BP 107/58 | HR 69 | Ht 72.0 in | Wt 270.0 lb

## 2022-05-08 DIAGNOSIS — I10 Essential (primary) hypertension: Secondary | ICD-10-CM

## 2022-05-08 DIAGNOSIS — G43809 Other migraine, not intractable, without status migrainosus: Secondary | ICD-10-CM

## 2022-05-08 DIAGNOSIS — G43909 Migraine, unspecified, not intractable, without status migrainosus: Secondary | ICD-10-CM | POA: Insufficient documentation

## 2022-05-08 MED ORDER — RIZATRIPTAN BENZOATE 10 MG PO TBDP: 10.0000 mg | ORAL_TABLET | ORAL | 0 refills | Status: DC | PRN

## 2022-05-08 NOTE — Progress Notes (Signed)
Established Patient Office Visit  Subjective   Patient ID: Stephen Orozco, male    DOB: [birth year]  Age: 51 y.o. MRN: [id number]  Chief Complaint  Patient presents with   Headache   Hypertension    HPI  Hypertension- Pt denies chest pain, SOB, dizziness, or heart palpitations.  Taking meds as directed w/o problems.  Denies medication side effects.      Discussed Headaches - started about 5 days ago. Started when he was at the grocery store midday.  He did not feel particularly stressed or worried he had not been taking any decongestants or NSAIDs it started in the back of his head but it was intense to the point that he felt nauseated and had to go home and rest.  In fact he was unable to cook dinner for his kids that night.  The headache persisted for about 4 days and then yesterday started easing off.  He did note that over the weekend his blood pressures were running in the 150s but yesterday seem to finally come down and then today his blood pressures been better as well.  He did take ibuprofen initially for the headache but switch to Tylenol realizing that that could also increase his blood pressure.  He has had some severe headaches over his adult hood.  He has a sister with migraines and in fact took one of her Maxalt at 1 point for the headaches that he had years ago and it actually worked well.    ROS    Objective:     BP (!) 107/58   Pulse 69   Ht 6' (1.829 m)   Wt 270 lb (122.5 kg)   SpO2 99%   BMI 36.62 kg/m    Physical Exam Constitutional:      Appearance: He is well-developed.  HENT:     Head: Normocephalic and atraumatic.     Right Ear: Tympanic membrane, ear canal and external ear normal.     Left Ear: Tympanic membrane, ear canal and external ear normal.     Nose: Nose normal.  Eyes:     Conjunctiva/sclera: Conjunctivae normal.     Pupils: Pupils are equal, round, and reactive to light.  Neck:     Thyroid: No thyromegaly.  Cardiovascular:     Rate  and Rhythm: Normal rate.     Heart sounds: Normal heart sounds.  Pulmonary:     Effort: Pulmonary effort is normal.     Breath sounds: Normal breath sounds.  Musculoskeletal:     Cervical back: Neck supple.  Lymphadenopathy:     Cervical: No cervical adenopathy.  Skin:    General: Skin is warm and dry.  Neurological:     Mental Status: He is alert and oriented to person, place, and time.      No results found for any visits on 05/08/22.    The 10-year ASCVD risk score (Arnett DK, et al., 2019) is: 4.8%    Assessment & Plan:   Problem List Items Addressed This Visit       Cardiovascular and Mediastinum   Migraine    It sounds like the headaches were severe enough and associated with nausea that they are probably consistent with migraines that he has not had them frequently over his adult life.  He does have a family history.  We discussed a trial of Maxalt in case it happens again.  Unclear if the pain itself was affecting his blood pressure or vice versa.  But  it seems to have settled back down if headaches recur then please let me know.  We could work-up further if needed.  No other neurologic symptoms.      Relevant Medications   rizatriptan (MAXALT-MLT) 10 MG disintegrating tablet   Essential hypertension, benign - Primary    Blood pressure was high for a few days but it is difficult to say if it was coming from the pain or if the elevated blood pressure was causing the headaches.  But the blood pressure has been better and back to normal over the last 2 days so not to make any changes today.        No follow-ups on file.    Beatrice Lecher, MD

## 2022-05-08 NOTE — Assessment & Plan Note (Signed)
It sounds like the headaches were severe enough and associated with nausea that they are probably consistent with migraines that he has not had them frequently over his adult life.  He does have a family history.  We discussed a trial of Maxalt in case it happens again.  Unclear if the pain itself was affecting his blood pressure or vice versa.  But it seems to have settled back down if headaches recur then please let me know.  We could work-up further if needed.  No other neurologic symptoms.

## 2022-05-08 NOTE — Assessment & Plan Note (Signed)
Blood pressure was high for a few days but it is difficult to say if it was coming from the pain or if the elevated blood pressure was causing the headaches.  But the blood pressure has been better and back to normal over the last 2 days so not to make any changes today.

## 2022-05-09 LAB — COMPLETE METABOLIC PANEL WITH GFR
AG Ratio: 1.8 (calc) (ref 1.0–2.5)
ALT: 30 U/L (ref 9–46)
AST: 21 U/L (ref 10–35)
Albumin: 4.5 g/dL (ref 3.6–5.1)
Alkaline phosphatase (APISO): 57 U/L (ref 35–144)
BUN: 16 mg/dL (ref 7–25)
CO2: 27 mmol/L (ref 20–32)
Calcium: 9.4 mg/dL (ref 8.6–10.3)
Chloride: 100 mmol/L (ref 98–110)
Creat: 0.92 mg/dL (ref 0.70–1.30)
Globulin: 2.5 g/dL (calc) (ref 1.9–3.7)
Glucose, Bld: 103 mg/dL — ABNORMAL HIGH (ref 65–99)
Potassium: 4.6 mmol/L (ref 3.5–5.3)
Sodium: 136 mmol/L (ref 135–146)
Total Bilirubin: 0.6 mg/dL (ref 0.2–1.2)
Total Protein: 7 g/dL (ref 6.1–8.1)
eGFR: 101 mL/min/{1.73_m2} (ref >=60)

## 2022-05-09 LAB — CBC
HCT: 42.5 % (ref 38.5–50.0)
Hemoglobin: 14.8 g/dL (ref 13.2–17.1)
MCH: 31.5 pg (ref 27.0–33.0)
MCHC: 34.8 g/dL (ref 32.0–36.0)
MCV: 90.4 fL (ref 80.0–100.0)
MPV: 10 fL (ref 7.5–12.5)
Platelets: 136 10*3/uL — ABNORMAL LOW (ref 140–400)
RBC: 4.7 10*6/uL (ref 4.20–5.80)
RDW: 13.8 % (ref 11.0–15.0)
WBC: 5.1 10*3/uL (ref 3.8–10.8)

## 2022-05-09 NOTE — Progress Notes (Signed)
Hi Stephen Orozco, your metabolic panel overall looks good.  Your platelets are a bit low which is a little bit unusual.  I would like to recheck that again in about a month.  It is not in a worrisome range but I want to see if it is a trend or if it is back to normal within a few weeks.

## 2022-05-10 ENCOUNTER — Encounter: Payer: Self-pay | Admitting: Family Medicine

## 2022-05-10 ENCOUNTER — Other Ambulatory Visit: Payer: Self-pay

## 2022-05-10 DIAGNOSIS — D696 Thrombocytopenia, unspecified: Secondary | ICD-10-CM

## 2022-05-11 NOTE — Telephone Encounter (Signed)
He may want to call his insurance company and find out if he has to use an in network lab provider such as [facility].  If that is the case then just let us know we can always order it for that location.  Otherwise I am not sure why his copayment was so high but again I would encourage him to reach out to his insurance company first to find out.  Also recommend repeat platelets in 3 to 4 weeks

## 2022-05-25 ENCOUNTER — Other Ambulatory Visit: Payer: Self-pay | Admitting: Family Medicine

## 2022-05-25 DIAGNOSIS — I10 Essential (primary) hypertension: Secondary | ICD-10-CM

## 2022-06-20 ENCOUNTER — Encounter: Payer: Self-pay | Admitting: Family Medicine

## 2022-06-22 LAB — CBC WITH DIFFERENTIAL/PLATELET
Absolute Monocytes: 336 cells/uL (ref 200–950)
Basophils Absolute: 18 cells/uL (ref 0–200)
Basophils Relative: 0.4 %
Eosinophils Absolute: 120 cells/uL (ref 15–500)
Eosinophils Relative: 2.6 %
HCT: 43.6 % (ref 38.5–50.0)
Hemoglobin: 15 g/dL (ref 13.2–17.1)
Lymphs Abs: 1753 cells/uL (ref 850–3900)
MCH: 31.3 pg (ref 27.0–33.0)
MCHC: 34.4 g/dL (ref 32.0–36.0)
MCV: 91 fL (ref 80.0–100.0)
MPV: 9.6 fL (ref 7.5–12.5)
Monocytes Relative: 7.3 %
Neutro Abs: 2374 cells/uL (ref 1500–7800)
Neutrophils Relative %: 51.6 %
Platelets: 126 10*3/uL — ABNORMAL LOW (ref 140–400)
RBC: 4.79 10*6/uL (ref 4.20–5.80)
RDW: 13.9 % (ref 11.0–15.0)
Total Lymphocyte: 38.1 %
WBC: 4.6 10*3/uL (ref 3.8–10.8)

## 2022-06-26 ENCOUNTER — Other Ambulatory Visit: Payer: Self-pay | Admitting: Family Medicine

## 2022-06-26 ENCOUNTER — Other Ambulatory Visit: Payer: Self-pay | Admitting: *Deleted

## 2022-06-26 DIAGNOSIS — D696 Thrombocytopenia, unspecified: Secondary | ICD-10-CM

## 2022-06-26 NOTE — Progress Notes (Signed)
Hi Stephen Orozco, it looks like your platelets are still low.  I was hoping they would rebound and go back into the normal range but they are actually just a little bit lower this is still in a safe range but I do think it needs additional work-up since its have been persistently low for at least a month.  The neck step would be to do a test called a peripheral smear where they actually take some of your blood and put it on a slide and actually have the pathologist look at those shape and size of the cells to see if that gives [name] a clue as to what might be happening.  None of your medications that should be causing this.  There are some other reasons that this can happen it can be an issue from the liver or even the spleen such as certain autoimmune diseases.  Your liver enzymes look normal when we checked it so that is actually pretty reassuring.  You had any unusual rashes?  Have you noticed any swollen lymph nodes?  Sometimes the peripheral smear can [place] and if this looks more viral or more like a deficiency issue.  Going to stick you again I like to go ahead and get your cholesterol and PSA as well.  I know its not related but we can go ahead and make sure that those look appropriate as well.

## 2022-06-28 ENCOUNTER — Other Ambulatory Visit: Payer: Self-pay | Admitting: Family Medicine

## 2022-06-28 DIAGNOSIS — F902 Attention-deficit hyperactivity disorder, combined type: Secondary | ICD-10-CM

## 2022-06-28 NOTE — Progress Notes (Signed)
Additional labs ordered.  He can come at his convenience.

## 2022-06-29 MED ORDER — AMPHETAMINE-DEXTROAMPHET ER 20 MG PO CP24: 20.0000 mg | ORAL_CAPSULE | ORAL | 0 refills | Status: DC

## 2022-06-29 NOTE — Telephone Encounter (Signed)
Last fill 03/01/2022 last visit 03/01/2022.

## 2022-07-03 LAB — LIPID PANEL W/REFLEX DIRECT LDL
Cholesterol: 232 mg/dL — ABNORMAL HIGH (ref <200)
HDL: 39 mg/dL — ABNORMAL LOW (ref >=40)
LDL Cholesterol (Calc): 152 mg/dL (calc) — ABNORMAL HIGH
Non-HDL Cholesterol (Calc): 193 mg/dL (calc) — ABNORMAL HIGH (ref <130)
Total CHOL/HDL Ratio: 5.9 (calc) — ABNORMAL HIGH (ref <5.0)
Triglycerides: 252 mg/dL — ABNORMAL HIGH (ref <150)

## 2022-07-03 LAB — SEDIMENTATION RATE: Sed Rate: 6 mm/h (ref 0–20)

## 2022-07-03 LAB — PATHOLOGIST SMEAR REVIEW

## 2022-07-03 LAB — PSA: PSA: 0.24 ng/mL (ref <=4.00)

## 2022-07-04 ENCOUNTER — Encounter: Payer: Self-pay | Admitting: Family Medicine

## 2022-07-04 DIAGNOSIS — D696 Thrombocytopenia, unspecified: Secondary | ICD-10-CM

## 2022-07-04 NOTE — Progress Notes (Signed)
Hi Stephen Orozco, LDL cholesterol is definitely up compared to last year.  Just encourage you to work on healthy diet and regular exercise.  State test is normal.  Inflammatory markers normal.  On the pathology review they did see a low platelet count they did not see any clumps.  Sometimes they will stick together and so when the machine measures that it actually measures a lower number because are sticking together, but this is not the case.  And like to go ahead and refer you to a hematologist which is a blood specialist since I do not have a great cause right now for your platelets to be low and so that that way we can either monitor or help differentiate what is the cause.  If you are okay with this referral then please let me know.  If you have a preference for provider or location, then please let me know.  The 10-year ASCVD risk score (Arnett DK, et al., 2019) is: 5.1%   Values used to calculate the score:     Age: 51 years     Sex: Male     Is Non-Hispanic African American: No     Diabetic: No     Tobacco smoker: No     Systolic Blood Pressure: 604 mmHg     Is BP treated: Yes     HDL Cholesterol: 39 mg/dL     Total Cholesterol: 232 mg/dL

## 2022-07-31 ENCOUNTER — Other Ambulatory Visit: Payer: Self-pay | Admitting: Family

## 2022-07-31 DIAGNOSIS — D696 Thrombocytopenia, unspecified: Secondary | ICD-10-CM

## 2022-08-01 ENCOUNTER — Inpatient Hospital Stay: Payer: BC Managed Care – PPO | Attending: Hematology & Oncology

## 2022-08-01 ENCOUNTER — Inpatient Hospital Stay: Payer: BC Managed Care – PPO | Admitting: Family

## 2022-08-01 ENCOUNTER — Encounter: Payer: Self-pay | Admitting: Family

## 2022-08-01 VITALS — BP 136/60 | HR 76 | Temp 98.8°F | Ht 72.0 in | Wt 278.8 lb

## 2022-08-01 DIAGNOSIS — D696 Thrombocytopenia, unspecified: Secondary | ICD-10-CM | POA: Insufficient documentation

## 2022-08-01 DIAGNOSIS — G4730 Sleep apnea, unspecified: Secondary | ICD-10-CM | POA: Diagnosis not present

## 2022-08-01 DIAGNOSIS — R5383 Other fatigue: Secondary | ICD-10-CM | POA: Insufficient documentation

## 2022-08-01 LAB — CMP (CANCER CENTER ONLY)
ALT: 28 U/L (ref 0–44)
AST: 19 U/L (ref 15–41)
Albumin: 4.6 g/dL (ref 3.5–5.0)
Alkaline Phosphatase: 59 U/L (ref 38–126)
Anion gap: 8 (ref 5–15)
BUN: 16 mg/dL (ref 6–20)
CO2: 28 mmol/L (ref 22–32)
Calcium: 9.8 mg/dL (ref 8.9–10.3)
Chloride: 100 mmol/L (ref 98–111)
Creatinine: 0.92 mg/dL (ref 0.61–1.24)
GFR, Estimated: >60 mL/min (ref >60)
Glucose, Bld: 101 mg/dL — ABNORMAL HIGH (ref 70–99)
Potassium: 4.5 mmol/L (ref 3.5–5.1)
Sodium: 136 mmol/L (ref 135–145)
Total Bilirubin: 0.5 mg/dL (ref 0.3–1.2)
Total Protein: 7.2 g/dL (ref 6.5–8.1)

## 2022-08-01 LAB — CBC WITH DIFFERENTIAL (CANCER CENTER ONLY)
Abs Immature Granulocytes: 0.04 10*3/uL (ref 0.00–0.07)
Basophils Absolute: 0 10*3/uL (ref 0.0–0.1)
Basophils Relative: 1 %
Eosinophils Absolute: 0.2 10*3/uL (ref 0.0–0.5)
Eosinophils Relative: 3 %
HCT: 42.8 % (ref 39.0–52.0)
Hemoglobin: 14.6 g/dL (ref 13.0–17.0)
Immature Granulocytes: 1 %
Lymphocytes Relative: 37 %
Lymphs Abs: 2.2 10*3/uL (ref 0.7–4.0)
MCH: 31.1 pg (ref 26.0–34.0)
MCHC: 34.1 g/dL (ref 30.0–36.0)
MCV: 91.1 fL (ref 80.0–100.0)
Monocytes Absolute: 0.5 10*3/uL (ref 0.1–1.0)
Monocytes Relative: 8 %
Neutro Abs: 3.2 10*3/uL (ref 1.7–7.7)
Neutrophils Relative %: 50 %
Platelet Count: 148 10*3/uL — ABNORMAL LOW (ref 150–400)
RBC: 4.7 MIL/uL (ref 4.22–5.81)
RDW: 13.4 % (ref 11.5–15.5)
WBC Count: 6.1 10*3/uL (ref 4.0–10.5)
nRBC: 0 % (ref 0.0–0.2)

## 2022-08-01 LAB — SAVE SMEAR(SSMR), FOR PROVIDER SLIDE REVIEW

## 2022-08-01 LAB — LACTATE DEHYDROGENASE: LDH: 138 U/L (ref 98–192)

## 2022-08-01 NOTE — Progress Notes (Signed)
Hematology/Oncology Consultation   Name: Stephen Orozco      MRN: [id number]    Location: Room/bed info not found  Date: 08/01/2022 Time:11:13 AM   REFERRING PHYSICIAN: Beatrice Lecher, MD  REASON FOR CONSULT:  Low platelet count   DIAGNOSIS: Mild thrombocytopenia   HISTORY OF PRESENT ILLNESS: Stephen Orozco is a very pleasant 51 yo caucasian gentleman with recent mild thrombocytopenia noted on routine lab work with PCP. Over the last 2 months his platelet count was noted to be 136 and then recheck 126. Today's count is improved at 148.  He has not noted any abnormal blood loss. No excessive bruising, no petechiae.  He is symptomatic with persistent fatigue.  He has sleep apnea and is wearing his CPAP nightly as prescribed.  He has low testosterone and states that supplementation did not help him.  He had his colonoscopy in February 2022. He had 4 benign polyps removed and was noted to have both internal and external hemorrhoids.  His spleen is in. No known liver issues.  No history of diabetes or thyroid disease.  No personal history of cancer. His  father had leiomyosarcoma, maternal uncle had skin cancer and all 4 grandparents had unknown primaries.  No fever, chills, n/v, cough, rash, dizziness, SOB, chest pain, palpitations, abdominal pain or changes in bowel or bladder habits.  No swelling, tenderness, numbness or tingling in his extremities at this time. He does have numbness and tingling in his hands and sometimes feet when laying down.  No falls or syncope reported.  No smoking, ETOH or recreational drug use.  Appetite and hydration are good. Weight is stable at 278 lbs.  He enjoys walking with his wife and dog for exercise when he is not too tired.  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   ROS: All other 10 point review of systems is negative.   PAST MEDICAL HISTORY:   Past Medical History:  Diagnosis Date   ADHD    Anxiety    Arthritis    Depression     Hyperlipidemia    Hypertension    Sleep apnea    cpap    ALLERGIES: Allergies  Allergen Reactions   Flexeril [Cyclobenzaprine] Other (See Comments)    Excess sedation   Adhesive [Tape] Rash      MEDICATIONS:  Current Outpatient Medications on File Prior to Visit  Medication Sig Dispense Refill   AMBULATORY NON FORMULARY MEDICATION Medication Name: *New start CPAP for OSA.  SEt to 12 cm water pressure. Large size [employer] Full Face Mask Simplus mask and heated humidification. Please fax download after one week.  Fax to [facility]. 1 Units 0   amphetamine-dextroamphetamine (ADDERALL XR) 20 MG 24 hr capsule Take 1 capsule (20 mg total) by mouth every morning. 90 capsule 0   fluticasone (FLONASE) 50 MCG/ACT nasal spray SPRAY 2 SPRAYS INTO EACH NOSTRIL EVERY DAY 48 mL 0   lisinopril (ZESTRIL) 10 MG tablet TAKE 1 TABLET BY MOUTH EVERY DAY 90 tablet 1   metaxalone (SKELAXIN) 400 MG tablet TAKE 1 to 1/2 TABLETS (200-400 MG TOTAL) BY MOUTH 2 (TWO) TIMES DAILY AS NEEDED For Back Pain. 30 tablet 1   pravastatin (PRAVACHOL) 40 MG tablet Take 1 tablet (40 mg total) by mouth daily. 90 tablet 3   rizatriptan (MAXALT-MLT) 10 MG disintegrating tablet Take 1 tablet (10 mg total) by mouth as needed for migraine. May repeat in 2 hours if needed 5 tablet 0   sertraline (ZOLOFT)  100 MG tablet Take 1 tablet (100 mg total) by mouth 2 (two) times daily. 180 tablet 1   tadalafil (CIALIS) 20 MG tablet Take 1 tablet (20 mg total) by mouth daily as needed for erectile dysfunction. 10 tablet 5   No current facility-administered medications on file prior to visit.     PAST SURGICAL HISTORY Past Surgical History:  Procedure Laterality Date   APPENDECTOMY     medial malleolar fixation Right    Ankle   MENISCUS REPAIR Left 2010    FAMILY HISTORY: Family History  Problem Relation Age of Onset   Alcohol abuse Father    Diabetes Father    Hyperlipidemia Father    Hypertension Father    Gout Father     Bipolar disorder Father    Cancer Other    Heart attack Other    Depression Other    Autism spectrum disorder Son    ADD / ADHD Son    Colon cancer Neg Hx    Colon polyps Neg Hx    Esophageal cancer Neg Hx    Stomach cancer Neg Hx    Rectal cancer Neg Hx     SOCIAL HISTORY:  reports that he quit smoking about 9 years ago. His smoking use included cigarettes. He has a 3.00 pack-year smoking history. He has never used smokeless tobacco. He reports current alcohol use of about 2.0 - 3.0 standard drinks of alcohol per week. He reports that he does not use drugs.  PERFORMANCE STATUS: The patient's performance status is 0 - Asymptomatic  PHYSICAL EXAM: Most Recent Vital Signs: Blood pressure 136/60, pulse 76, temperature 98.8 F (37.1 C), temperature source Oral, height 6' (1.829 m), weight 278 lb 12.8 oz (126.5 kg), SpO2 97 %. BP 136/60 (BP Location: Right Arm, Patient Position: Sitting)   Pulse 76   Temp 98.8 F (37.1 C) (Oral)   Ht 6' (1.829 m)   Wt 278 lb 12.8 oz (126.5 kg)   SpO2 97%   BMI 37.81 kg/m   General Appearance:    Alert, cooperative, no distress, appears stated age  Head:    Normocephalic, without obvious abnormality, atraumatic  Eyes:    PERRL, conjunctiva/corneas clear, EOM's intact, fundi    benign, both eyes             Throat:   Lips, mucosa, and tongue normal; teeth and gums normal  Neck:   Supple, symmetrical, trachea midline, no adenopathy;       thyroid:  No enlargement/tenderness/nodules; no carotid   bruit or JVD  Back:     Symmetric, no curvature, ROM normal, no CVA tenderness  Lungs:     Clear to auscultation bilaterally, respirations unlabored  Chest wall:    No tenderness or deformity  Heart:    Regular rate and rhythm, S1 and S2 normal, no murmur, rub   or gallop  Abdomen:     Soft, non-tender, bowel sounds active all four quadrants,    no masses, no organomegaly        Extremities:   Extremities normal, atraumatic, no cyanosis or edema   Pulses:   2+ and symmetric all extremities  Skin:   Skin color, texture, turgor normal, no rashes or lesions  Lymph nodes:   Cervical, supraclavicular, and axillary nodes normal  Neurologic:   CNII-XII intact. Normal strength, sensation and reflexes      throughout    LABORATORY DATA:  Results for orders placed or performed in visit on 08/01/22 (from  the past 48 hour(s))  CBC with Differential ([facility]cer Center Only)     Status: Abnormal   Collection Time: 08/01/22 10:35 AM  Result Value Ref Range   WBC Count 6.1 4.0 - 10.5 K/uL   RBC 4.70 4.22 - 5.81 MIL/uL   Hemoglobin 14.6 13.0 - 17.0 g/dL   HCT 42.8 39.0 - 52.0 %   MCV 91.1 80.0 - 100.0 fL   MCH 31.1 26.0 - 34.0 pg   MCHC 34.1 30.0 - 36.0 g/dL   RDW 13.4 11.5 - 15.5 %   Platelet Count 148 (L) 150 - 400 K/uL   nRBC 0.0 0.0 - 0.2 %   Neutrophils Relative % 50 %   Neutro Abs 3.2 1.7 - 7.7 K/uL   Lymphocytes Relative 37 %   Lymphs Abs 2.2 0.7 - 4.0 K/uL   Monocytes Relative 8 %   Monocytes Absolute 0.5 0.1 - 1.0 K/uL   Eosinophils Relative 3 %   Eosinophils Absolute 0.2 0.0 - 0.5 K/uL   Basophils Relative 1 %   Basophils Absolute 0.0 0.0 - 0.1 K/uL   Immature Granulocytes 1 %   Abs Immature Granulocytes 0.04 0.00 - 0.07 K/uL    Comment: Performed at [facility] at [facility], [address] [place], [place] [address]  CMP ([facility] only)     Status: Abnormal   Collection Time: 08/01/22 10:35 AM  Result Value Ref Range   Sodium 136 135 - 145 mmol/L   Potassium 4.5 3.5 - 5.1 mmol/L   Chloride 100 98 - 111 mmol/L   CO2 28 22 - 32 mmol/L   Glucose, Bld 101 (H) 70 - 99 mg/dL    Comment: Glucose reference range applies only to samples taken after fasting for at least 8 hours.   BUN 16 6 - 20 mg/dL   Creatinine 0.92 0.61 - 1.24 mg/dL   Calcium 9.8 8.9 - 10.3 mg/dL   Total Protein 7.2 6.5 - 8.1 g/dL   Albumin 4.6 3.5 - 5.0 g/dL   AST 19 15 - 41 U/L   ALT 28 0 - 44 U/L   Alkaline Phosphatase  59 38 - 126 U/L   Total Bilirubin 0.5 0.3 - 1.2 mg/dL   GFR, Estimated >60 >60 mL/min    Comment: (NOTE) Calculated using the CKD-EPI Creatinine Equation (2021)    Anion gap 8 5 - 15    Comment: Performed at [facility] at [facility], [address] [place], [place] [address]  Save Smear for Provider Slide Review     Status: None   Collection Time: 08/01/22 10:35 AM  Result Value Ref Range   Smear Review SMEAR STAINED AND AVAILABLE FOR REVIEW     Comment: Performed at [facility] at [facility], [address] [place], [place] [address]      RADIOGRAPHY: No results found.     PATHOLOGY: None  ASSESSMENT/PLAN: Mr. Crescenzo is a very pleasant 51 yo caucasian gentleman with recent mild thrombocytopenia noted on routine lab work with PCP.  Labs and blood smear reviewed with Dr. Marin Olp. Smear showed no abnormality or evidence of malignancy. Platelets large and well developed. Counts are stable to improved. No intervention needed.  At this time we will release him back to his PCP and follow-up as needed.   All questions were answered. The patient knows to call the clinic with any problems, questions or concerns.  We can certainly see the patient much sooner if necessary.  The patient was discussed with Dr. Marin Olp and he is in agreement with the aforementioned.   Lottie Dawson, NP

## 2022-08-09 ENCOUNTER — Telehealth: Payer: Self-pay | Admitting: *Deleted

## 2022-08-09 NOTE — Telephone Encounter (Signed)
Per 08/09/22 los - Follow-up as needed.

## 2022-11-12 ENCOUNTER — Other Ambulatory Visit: Payer: Self-pay | Admitting: Family Medicine

## 2022-11-12 DIAGNOSIS — F902 Attention-deficit hyperactivity disorder, combined type: Secondary | ICD-10-CM

## 2022-11-13 MED ORDER — AMPHETAMINE-DEXTROAMPHET ER 20 MG PO CP24: 20.0000 mg | ORAL_CAPSULE | ORAL | 0 refills | Status: DC

## 2022-11-13 NOTE — Telephone Encounter (Signed)
Left pt voicemail for pt to call back and schedule an appt.

## 2022-11-13 NOTE — Telephone Encounter (Signed)
Med sent. Needs to schedule f/u in Feb or early March

## 2022-11-13 NOTE — Telephone Encounter (Signed)
Per provider - please contact the patient to schedule a follow up visit in March for controlled rx. Thanks in advance.

## 2022-11-13 NOTE — Telephone Encounter (Signed)
Last OV: 05/08/22 Next OV: none Last RF: 06/29/22

## 2023-01-01 ENCOUNTER — Encounter: Payer: Self-pay | Admitting: Family Medicine

## 2023-01-01 ENCOUNTER — Ambulatory Visit: Payer: BC Managed Care – PPO | Admitting: Family Medicine

## 2023-01-01 VITALS — BP 118/59 | HR 73 | Ht 72.0 in | Wt 279.0 lb

## 2023-01-01 DIAGNOSIS — D696 Thrombocytopenia, unspecified: Secondary | ICD-10-CM

## 2023-01-01 DIAGNOSIS — L723 Sebaceous cyst: Secondary | ICD-10-CM

## 2023-01-01 DIAGNOSIS — F330 Major depressive disorder, recurrent, mild: Secondary | ICD-10-CM | POA: Diagnosis not present

## 2023-01-01 DIAGNOSIS — F902 Attention-deficit hyperactivity disorder, combined type: Secondary | ICD-10-CM

## 2023-01-01 DIAGNOSIS — G4733 Obstructive sleep apnea (adult) (pediatric): Secondary | ICD-10-CM

## 2023-01-01 DIAGNOSIS — I10 Essential (primary) hypertension: Secondary | ICD-10-CM | POA: Diagnosis not present

## 2023-01-01 DIAGNOSIS — Z23 Encounter for immunization: Secondary | ICD-10-CM | POA: Diagnosis not present

## 2023-01-01 MED ORDER — AMPHETAMINE-DEXTROAMPHET ER 20 MG PO CP24: 20.0000 mg | ORAL_CAPSULE | ORAL | 0 refills | Status: DC

## 2023-01-01 MED ORDER — LISINOPRIL 10 MG PO TABS: 10.0000 mg | ORAL_TABLET | Freq: Every day | ORAL | 3 refills | Status: DC

## 2023-01-01 MED ORDER — AMBULATORY NON FORMULARY MEDICATION: 0 refills | Status: AC

## 2023-01-01 NOTE — Assessment & Plan Note (Signed)
Doing really well on current regimen.  Happy with the effect of the medication.  Continue current regimen.     01/01/2023   10:20 AM 05/08/2022    8:46 AM 09/01/2021    3:40 PM  PHQ9 SCORE ONLY  PHQ-9 Total Score 6 8 9

## 2023-01-01 NOTE — Assessment & Plan Note (Signed)
Well controlled. Continue current regimen. Follow up in  6 mo  

## 2023-01-01 NOTE — Progress Notes (Signed)
Established Patient Office Visit  Subjective   Patient ID: Stephen Orozco, male    DOB: 1971/02/23  Age: 52 y.o. MRN: [id number]  Chief Complaint  Patient presents with   Hypertension    HPI  Hypertension- Pt denies chest pain, SOB, dizziness, or heart palpitations.  Taking meds as directed w/o problems.  Denies medication side effects.    ADD - Reports symptoms are well controlled on current regime. Denies any problems with insomnia, chest pain, palpitations, or SOB.    F/U MDD - currently on sertraline.  Reports he is doing well and feeling happy with his current regimen.  He did want to discuss his CPAP he really like to get a new machine and equipment.  For a while now he has been purchasing some over-the-counter supplies.  He also like to try the nasal pillows.  He reports he mostly breathes through his nose at night.  He also has a sebaceous cyst on his scalp on the top of his head on the left side that he would like to have removed as well    ROS    Objective:     BP (!) 118/59   Pulse 73   Ht 6' (1.829 m)   Wt 279 lb (126.6 kg)   SpO2 97%   BMI 37.84 kg/m    Physical Exam Constitutional:      Appearance: He is well-developed.  HENT:     Head: Normocephalic and atraumatic.  Cardiovascular:     Rate and Rhythm: Normal rate and regular rhythm.     Heart sounds: Normal heart sounds.  Pulmonary:     Effort: Pulmonary effort is normal.     Breath sounds: Normal breath sounds.  Skin:    General: Skin is warm and dry.  Neurological:     Mental Status: He is alert and oriented to person, place, and time.  Psychiatric:        Behavior: Behavior normal.     No results found for any visits on 01/01/23.    The 10-year ASCVD risk score (Arnett DK, et al., 2019) is: 6.6%    Assessment & Plan:   Problem List Items Addressed This Visit       Cardiovascular and Mediastinum   Essential hypertension, benign - Primary    Well controlled. Continue current  regimen. Follow up in  58mo       Relevant Medications   lisinopril (ZESTRIL) 10 MG tablet   Other Relevant Orders   BASIC METABOLIC PANEL WITH GFR     Respiratory   OSA (obstructive sleep apnea)    Distant with wearing his CPAP.  Will try to send request for new machine and supplies.      Relevant Medications   AMBULATORY NON FORMULARY MEDICATION     Other   MDD (major depressive disorder)    Doing really well on current regimen.  Happy with the effect of the medication.  Continue current regimen.     01/01/2023   10:20 AM 05/08/2022    8:46 AM 09/01/2021    3:40 PM  PHQ9 SCORE ONLY  PHQ-9 Total Score 6 8 9         Low platelet count ([place])    Hematology felt like everything was stable and recommended just following periodically.  No concerns.      Relevant Orders   CBC with Differential/Platelet   ADHD    Well controlled. Continue current regimen. Follow up in  75m o  Relevant Medications   amphetamine-dextroamphetamine (ADDERALL XR) 20 MG 24 hr capsule (Start on 02/02/2023)   Other Visit Diagnoses     Need for immunization against influenza       Relevant Orders   Flu Vaccine QUAD 20mo+IM (Fluarix, Fluzone & Alfiuria Quad PF) (Completed)   Sebaceous cyst       Relevant Orders   Ambulatory referral to Dermatology       Seb cyst-will refer to dermatology for excision.  Return in about 6 months (around 07/04/2023) for Hypertension.    Beatrice Lecher, MD

## 2023-01-01 NOTE — Assessment & Plan Note (Signed)
Distant with wearing his CPAP.  Will try to send request for new machine and supplies.

## 2023-01-01 NOTE — Assessment & Plan Note (Signed)
Hematology felt like everything was stable and recommended just following periodically.  No concerns.

## 2023-01-02 ENCOUNTER — Encounter: Payer: Self-pay | Admitting: Family Medicine

## 2023-01-02 DIAGNOSIS — H1031 Unspecified acute conjunctivitis, right eye: Secondary | ICD-10-CM

## 2023-01-02 LAB — BASIC METABOLIC PANEL WITH GFR
BUN: 15 mg/dL (ref 7–25)
CO2: 29 mmol/L (ref 20–32)
Calcium: 9.6 mg/dL (ref 8.6–10.3)
Chloride: 101 mmol/L (ref 98–110)
Creat: 0.9 mg/dL (ref 0.70–1.30)
Glucose, Bld: 110 mg/dL — ABNORMAL HIGH (ref 65–99)
Potassium: 4.4 mmol/L (ref 3.5–5.3)
Sodium: 138 mmol/L (ref 135–146)
eGFR: 103 mL/min/{1.73_m2} (ref >=60)

## 2023-01-02 LAB — CBC WITH DIFFERENTIAL/PLATELET
Absolute Monocytes: 450 cells/uL (ref 200–950)
Basophils Absolute: 42 cells/uL (ref 0–200)
Basophils Relative: 0.7 %
Eosinophils Absolute: 150 cells/uL (ref 15–500)
Eosinophils Relative: 2.5 %
HCT: 43.2 % (ref 38.5–50.0)
Hemoglobin: 15 g/dL (ref 13.2–17.1)
Lymphs Abs: 1560 cells/uL (ref 850–3900)
MCH: 31.3 pg (ref 27.0–33.0)
MCHC: 34.7 g/dL (ref 32.0–36.0)
MCV: 90.2 fL (ref 80.0–100.0)
MPV: 10 fL (ref 7.5–12.5)
Monocytes Relative: 7.5 %
Neutro Abs: 3798 cells/uL (ref 1500–7800)
Neutrophils Relative %: 63.3 %
Platelets: 133 10*3/uL — ABNORMAL LOW (ref 140–400)
RBC: 4.79 10*6/uL (ref 4.20–5.80)
RDW: 13.8 % (ref 11.0–15.0)
Total Lymphocyte: 26 %
WBC: 6 10*3/uL (ref 3.8–10.8)

## 2023-01-02 MED ORDER — ERYTHROMYCIN 5 MG/GM OP OINT: 1.0000 | TOPICAL_OINTMENT | Freq: Three times a day (TID) | OPHTHALMIC | 0 refills | Status: DC

## 2023-01-02 NOTE — Progress Notes (Signed)
Hi Cornelious, platelets are stable and metabolic panel is within normal limits.

## 2023-03-19 ENCOUNTER — Encounter: Payer: Self-pay | Admitting: Family Medicine

## 2023-03-19 ENCOUNTER — Other Ambulatory Visit: Payer: Self-pay | Admitting: Family Medicine

## 2023-03-19 DIAGNOSIS — E785 Hyperlipidemia, unspecified: Secondary | ICD-10-CM

## 2023-03-19 DIAGNOSIS — F330 Major depressive disorder, recurrent, mild: Secondary | ICD-10-CM

## 2023-04-02 ENCOUNTER — Encounter: Payer: Self-pay | Admitting: Family Medicine

## 2023-04-05 ENCOUNTER — Encounter: Payer: Self-pay | Admitting: Emergency Medicine

## 2023-04-05 ENCOUNTER — Ambulatory Visit
Admission: EM | Admit: 2023-04-05 | Discharge: 2023-04-05 | Disposition: A | Discharge status: Home / Self Care | Payer: BC Managed Care – PPO | Attending: Family Medicine | Admitting: Family Medicine

## 2023-04-05 DIAGNOSIS — N3000 Acute cystitis without hematuria: Secondary | ICD-10-CM | POA: Diagnosis not present

## 2023-04-05 DIAGNOSIS — N451 Epididymitis: Secondary | ICD-10-CM | POA: Diagnosis not present

## 2023-04-05 LAB — POCT URINALYSIS DIP (MANUAL ENTRY)
Bilirubin, UA: NEGATIVE
Blood, UA: NEGATIVE
Glucose, UA: NEGATIVE mg/dL
Ketones, POC UA: NEGATIVE mg/dL
Nitrite, UA: NEGATIVE
Protein Ur, POC: NEGATIVE mg/dL
Spec Grav, UA: 1.02 (ref 1.010–1.025)
Urobilinogen, UA: 1 E.U./dL
pH, UA: 7 (ref 5.0–8.0)

## 2023-04-05 MED ORDER — DOXYCYCLINE HYCLATE 100 MG PO CAPS: 100.0000 mg | ORAL_CAPSULE | Freq: Two times a day (BID) | ORAL | 0 refills | Status: AC

## 2023-04-05 NOTE — ED Provider Notes (Signed)
[facility]    CSN: [id number] Arrival date & time: 04/05/23  4696      History   Chief Complaint Chief Complaint  Patient presents with   Testicle Pain    right    HPI Stephen Orozco is a 52 y.o. Orozco.   HPI Stephen 52 year old Orozco presents with right testicular pain for 1 week.  Reports right testicle is tender and denies swelling or extreme pain of right testicle.  Reports taking OTC AZO yesterday.  Reports drinking 32 ounces of water 4 times daily.  Denies history of nephrolithiasis.  PMH significant for morbid obesity, HTN, and sleep apnea.  Past Medical History:  Diagnosis Date   ADHD    Anxiety    Arthritis    Depression    Hyperlipidemia    Hypertension    Sleep apnea    cpap    Patient Active Problem List   Diagnosis Date Noted   Low platelet count (HCC) 01/01/2023   Migraine 05/08/2022   Erectile dysfunction 09/01/2021   ADHD 03/01/2021   Environmental and seasonal allergies 12/27/2020   Throat clearing 12/27/2020   Low testosterone 10/02/2018   OSA (obstructive sleep apnea) 03/17/2018   History of DVT (deep vein thrombosis) 03/17/2018   MDD (major depressive disorder) 02/13/2018   Hyperlipidemia 11/20/2013   Obesity, Class II, BMI 35-39.9, with comorbidity 04/22/2012   Anxiety 02/26/2012   Essential hypertension, benign 02/26/2012    Past Surgical History:  Procedure Laterality Date   APPENDECTOMY     medial malleolar fixation Right    Ankle   MENISCUS REPAIR Left 2010       Home Medications    Prior to Admission medications   Medication Sig Start Date End Date Taking? Authorizing Provider  doxycycline (VIBRAMYCIN) 100 MG capsule Take 1 capsule (100 mg total) by mouth 2 (two) times daily for 10 days. 04/05/23 04/15/23 Yes Trevor Iha, FNP  AMBULATORY NON FORMULARY MEDICATION Medication Name: CPAP machine and supplies for OSA.  Set to 12 cm water pressure. Nasal pillows and heated humidification. Please fax download after  one week.  Fax to 01/01/23   Agapito Games, MD  amphetamine-dextroamphetamine (ADDERALL XR) 20 MG 24 hr capsule Take 1 capsule (20 mg total) by mouth every morning. 02/02/23   Agapito Games, MD  lisinopril (ZESTRIL) 10 MG tablet Take 1 tablet (10 mg total) by mouth daily. 01/01/23   Agapito Games, MD  pravastatin (PRAVACHOL) 40 MG tablet Take 1 tablet by mouth once daily 03/19/23   Agapito Games, MD  sertraline (ZOLOFT) 100 MG tablet Take 1 tablet by mouth twice daily 03/19/23   Agapito Games, MD  tadalafil (CIALIS) 20 MG tablet Take 1 tablet (20 mg total) by mouth daily as needed for erectile dysfunction. 09/01/21   Agapito Games, MD    Family History Family History  Problem Relation Age of Onset   Alcohol abuse Father    Diabetes Father    Hyperlipidemia Father    Hypertension Father    Gout Father    Bipolar disorder Father    Autism spectrum disorder Son    ADD / ADHD Son    Cancer Other    Heart attack Other    Depression Other    Colon cancer Neg Hx    Colon polyps Neg Hx    Esophageal cancer Neg Hx    Stomach cancer Neg Hx    Rectal cancer Neg Hx     Social  History Social History   Tobacco Use   Smoking status: Former    Packs/day: 0.30    Years: 10.00    Additional pack years: 0.00    Total pack years: 3.00    Types: Cigarettes    Quit date: 09/14/2012    Years since quitting: 10.5   Smokeless tobacco: Never  Vaping Use   Vaping Use: Never used  Substance Use Topics   Alcohol use: Yes    Alcohol/week: 2.0 - 3.0 standard drinks of alcohol    Types: 2 - 3 Standard drinks or equivalent per week    Comment: Occasional use   Drug use: No     Allergies   Flexeril [cyclobenzaprine] and Adhesive [tape]   Review of Systems Review of Systems  Genitourinary:  Positive for testicular pain.  All other systems reviewed and are negative.    Physical Exam Triage Vital Signs ED Triage Vitals  Enc Vitals Group     BP  04/05/23 0923 (!) 135/93     Pulse Rate 04/05/23 0923 71     Resp 04/05/23 0923 16     Temp 04/05/23 0923 98.5 F (36.9 C)     Temp Source 04/05/23 0923 Oral     SpO2 04/05/23 0923 98 %     Weight 04/05/23 0927 260 lb (117.9 kg)     Height 04/05/23 0927 6' (1.829 m)     Head Circumference --      Peak Flow --      Pain Score 04/05/23 0926 3     Pain Loc --      Pain Edu? --      Excl. in GC? --    No data found.  Updated Vital Signs BP (!) 135/93 (BP Location: Left Arm)   Pulse 71   Temp 98.5 F (36.9 C) (Oral)   Resp 16   Ht 6' (1.829 m)   Wt 260 lb (117.9 kg)   SpO2 98%   BMI 35.26 kg/m    Physical Exam Vitals and nursing note reviewed.  Constitutional:      Appearance: Normal appearance. He is obese.  HENT:     Head: Normocephalic and atraumatic.     Mouth/Throat:     Mouth: Mucous membranes are moist.     Pharynx: Oropharynx is clear.  Eyes:     Extraocular Movements: Extraocular movements intact.     Conjunctiva/sclera: Conjunctivae normal.     Pupils: Pupils are equal, round, and reactive to light.  Cardiovascular:     Rate and Rhythm: Normal rate and regular rhythm.     Pulses: Normal pulses.     Heart sounds: Normal heart sounds.  Pulmonary:     Effort: Pulmonary effort is normal.     Breath sounds: Normal breath sounds. No wheezing, rhonchi or rales.  Abdominal:     Tenderness: There is no right CVA tenderness or left CVA tenderness.  Genitourinary:    Penis: Normal.      Testes: Normal.  Musculoskeletal:        General: Normal range of motion.     Cervical back: Normal range of motion and neck supple.  Skin:    General: Skin is warm and dry.  Neurological:     General: No focal deficit present.     Mental Status: He is alert and oriented to person, place, and time. Mental status is at baseline.  Psychiatric:        Mood and Affect: Mood normal.  Behavior: Behavior normal.      UC Treatments / Results  Labs (all labs ordered are  listed, but only abnormal results are displayed) Labs Reviewed  POCT URINALYSIS DIP (MANUAL ENTRY) - Abnormal; Notable for the following components:      Result Value   Leukocytes, UA Trace (*)    All other components within normal limits  URINE CULTURE    EKG   Radiology No results found.  Procedures Procedures (including critical care time)  Medications Ordered in UC Medications - No data to display  Initial Impression / Assessment and Plan / UC Course  I have reviewed the triage vital signs and the nursing notes.  Pertinent labs & imaging results that were available during my care of the patient were reviewed by me and considered in my medical decision making (see chart for details).     MDM: 1. Epididymitis, right-Rx'd Doxycycline 100 mg capsule twice daily x 10 days.  2.  Acute cystitis without hematuria-UA revealed above, urine culture ordered, Rx'd Doxycycline 100 mg capsule twice daily x 10 days. Instructed patient to take medication as directed with food to completion.  Encouraged increase daily water intake to 64 ounces per day while taking this medication.  Advised if symptoms worsen and/or unresolved please follow-up with PCP, urology, or here for further evaluation.  Advised we will follow-up with urine culture results once received.  Patient discharged home, hemodynamically stable.  Final Clinical Impressions(s) / UC Diagnoses   Final diagnoses:  Acute cystitis without hematuria  Epididymitis, right     Discharge Instructions      Instructed patient to take medication as directed with food to completion.  Encouraged increase daily water intake to 64 ounces per day while taking this medication.  Advised if symptoms worsen and/or unresolved please follow-up with PCP, urology, or here for further evaluation.  Advised we will follow-up with urine culture results once received.     ED Prescriptions     Medication Sig Dispense Auth. Provider   doxycycline  (VIBRAMYCIN) 100 MG capsule Take 1 capsule (100 mg total) by mouth 2 (two) times daily for 10 days. 20 capsule Trevor Iha, FNP      PDMP not reviewed this encounter.   Stephen Orozco, Sanden, FNP 04/05/23 1002

## 2023-04-05 NOTE — ED Triage Notes (Addendum)
Right testicular pain x 1 week  Tender - denies swelling  Denies hx of kidney stones  Lower back pain started on Monday  Pt drinks 32 oz x 4 water daily Took AZO yesterday am

## 2023-04-05 NOTE — Discharge Instructions (Addendum)
Instructed patient to take medication as directed with food to completion.  Encouraged increase daily water intake to 64 ounces per day while taking this medication.  Advised if symptoms worsen and/or unresolved please follow-up with PCP, urology, or here for further evaluation.  Advised we will follow-up with urine culture results once received.

## 2023-04-06 LAB — URINE CULTURE: Culture: NO GROWTH

## 2023-04-15 ENCOUNTER — Other Ambulatory Visit: Payer: Self-pay | Admitting: Family Medicine

## 2023-04-15 DIAGNOSIS — F902 Attention-deficit hyperactivity disorder, combined type: Secondary | ICD-10-CM

## 2023-04-16 MED ORDER — AMPHETAMINE-DEXTROAMPHET ER 20 MG PO CP24: 20.0000 mg | ORAL_CAPSULE | ORAL | 0 refills | Status: DC

## 2023-04-26 ENCOUNTER — Encounter: Payer: Self-pay | Admitting: Family Medicine

## 2023-04-26 ENCOUNTER — Telehealth: Payer: Self-pay

## 2023-04-26 NOTE — Telephone Encounter (Signed)
Initiated Prior authorization ZOX:[id number] ER 20MG  er capsules Via: Covermymeds Case/Key:[id number] Status: Pending as of k7/12/24 Reason: Notified Pt via: Mychart

## 2023-06-20 ENCOUNTER — Encounter: Payer: Self-pay | Admitting: Dermatology

## 2023-06-20 ENCOUNTER — Ambulatory Visit (INDEPENDENT_AMBULATORY_CARE_PROVIDER_SITE_OTHER): Payer: BC Managed Care – PPO | Admitting: Dermatology

## 2023-06-20 VITALS — BP 159/107

## 2023-06-20 DIAGNOSIS — L729 Follicular cyst of the skin and subcutaneous tissue, unspecified: Secondary | ICD-10-CM

## 2023-06-20 DIAGNOSIS — L720 Epidermal cyst: Secondary | ICD-10-CM | POA: Diagnosis not present

## 2023-06-20 DIAGNOSIS — Z808 Family history of malignant neoplasm of other organs or systems: Secondary | ICD-10-CM

## 2023-06-20 NOTE — Patient Instructions (Addendum)
Hello Stephen Orozco,  Thank you for visiting our clinic today. Your proactive approach to addressing your health concerns is greatly appreciated.  Here is a summary of the key points from today's consultation and the next steps in your treatment plan:  - Diagnosis: You have been diagnosed with a subcutaneous non-tender mobile cystic nodule (19mm) on the left parietal scalp, identified as a pilar cyst.  - Procedure:   - Scheduled Removal: The pilar cyst is scheduled for removal within the next four weeks.   - Anesthesia: The procedure will be performed under local anesthesia with lidocaine.   - Sutures: Sutures will be placed during the procedure, which will need to be removed in about 7-10 days.  - Surgeon: Dr. Caralyn Guile, a Mohs surgeon with specialized training in dermatological surgery, will conduct the surgery.  - Follow-Up:   - Skin Check: Schedule a full skin check for a comprehensive head-to-toe evaluation. This is recommended annually to monitor any new developments.  - Instructions:   - Surgery Scheduling: You will be directly scheduled for the surgery without a separate consult due to the straightforward nature of the procedure. Please confirm your appointment at the front desk before leaving today.  It was a pleasure to meet you, and I am glad we could effectively address your concerns. Should you have any further questions or need assistance before your scheduled procedure, please do not hesitate to reach out.  Wishing you the best of health,  Dr. [facility]   PRE-OPERATIVE INSTRUCTIONS  We recommend you read the following instructions. If you have any questions or concerns, please call the office at [phone].  Shower and wash the entire body with soap and water the day of your surgery paying special attention to cleansing at and around the planned surgery site. Avoid aspirin or aspirin containing products at least ten (10) days prior to your surgical  procedure and for at least one week (7 days) after your surgical procedure. If you take aspirin on a regular basis for heart disease or history of stroke or for any other reason, we may recommend you continue taking aspirin but please notify us if you take this on a regular basis. Aspirin can cause more bleeding to occur during surgery as well as prolonged bleeding and bruising after surgery. Avoid other nonsteroidal pain medications at least one week prior to surgery and at least one week after your surgery. These include medications such as Ibuprofen (Motrin, Advil and Nuprin), Naprosyn, Voltaren, Relafen, etc. If these medications are used for therapeutic reasons, please inform us as they can cause increased bleeding or prolonged bleeding during and bruising after surgical procedures.  Please advise [name] if you are taking any "blood thinner" medications such as Coumadin or Dipyridamole or Plavix or similar medications. These cause increased bleeding and prolonged bleeding during procedures and bruising after surgical procedures. We may have to consider discontinuing these medications briefly prior to and shortly after your surgery if safe to do so. Please inform us of all medications you are currently taking. All medications that are taken regularly should be taken the day of surgery as you always do. Nevertheless, we need to be informed of what medications you are taking prior to surgery to know whether they will affect the procedure or cause any complications. Please inform us of any medication allergies. Also inform us of whether you have allergies to Latex or rubber products or whether you have had any adverse reaction to Lidocaine or Epinephrine. Please inform  us of any prosthetic or artificial body parts such as artificial heart valve, joint replacements, etc., or similar condition that might require preoperative antibiotics. We recommend avoidance of alcohol at least two weeks prior to surgery and  continued avoidance for at least two weeks after surgery. We recommend avoidance of tobacco smoking at least two weeks prior to surgery and continued abstinence for at least two weeks after surgery. Do not plan strenuous exercise, strenuous work or strenuous lifting for approximately four weeks after your surgery. We request if you are unable to make your scheduled surgical appointment, please call us at least a week in advance or as soon as you are aware of a problem so that we can cancel or reschedule the appointment. You MAY TAKE TYLENOL (acetaminophen) for pain as it is not a blood thinner. PLEASE PLAN TO BE IN TOWN FOR TWO WEEKS FOLLOWING SURGERY. THIS IS IMPORTANT SO YOU CAN BE CHECKED FOR DRESSING CHANGES, SUTURE REMOVAL AND TO MONITOR FOR POSSIBLE COMPLICATIONS.

## 2023-06-20 NOTE — Progress Notes (Signed)
   New Patient Visit   Subjective  Stephen Orozco is a 52 y.o. male who presents for the following: He has a cyst of his left scalp that has been there a couple of years and has gotten bigger. He would like it removed. His father had a history leiomyosarcoma and does not want to leave anything that may potentially be cancer.   The following portions of the chart were reviewed this encounter and updated as appropriate: medications, allergies, medical history  Review of Systems:  No other skin or systemic complaints except as noted in HPI or Assessment and Plan.  Objective  Well appearing patient in no apparent distress; mood and affect are within normal limits.   A focused examination was performed of the following areas:  Scalp   Relevant exam findings are noted in the Assessment and Plan.    Assessment & Plan   EPIDERMAL INCLUSION CYST Exam: 19 mm non-tender mobile SQ cystic nodule of left parietal scalp.        Benign-appearing. Exam most consistent with an epidermal inclusion cyst. Discussed that a cyst is a benign growth that can grow over time and sometimes get irritated or inflamed. Recommend observation if it is not bothersome. Discussed option of surgical excision to remove it if it is growing, symptomatic, or other changes noted. Please call for new or changing lesions so they can be evaluated.   We will plan to schedule surgery appointment with Stephen. Stephen Orozco.  Family History of Leiomyosarcoma - Plan: Educate the patient on the importance of regular skin checks and monitoring for any new or changing skin lesions. Schedule an annual skin check with Stephen. Stephen Orozco.   New Patient Dermatology Evaluation - Plan: Schedule the patient for a comprehensive skin check with Stephen. Stephen Orozco at their convenience. Establish a regular follow-up schedule for skin evaluations and address any concerns as they arise.    Return for Surgery with Stephen Orozco cyst of left scalp and  TBSE with Stephen [name] .  I, Joanie Coddington, CMA, am acting as scribe for [employer], DO .   Documentation: I have reviewed the above documentation for accuracy and completeness, and I agree with the above.  Stephen Reusing, DO

## 2023-07-04 ENCOUNTER — Encounter: Payer: Self-pay | Admitting: Family Medicine

## 2023-07-04 ENCOUNTER — Ambulatory Visit: Payer: BC Managed Care – PPO | Admitting: Family Medicine

## 2023-07-04 VITALS — BP 113/63 | HR 85 | Ht 72.0 in | Wt 277.0 lb

## 2023-07-04 DIAGNOSIS — F902 Attention-deficit hyperactivity disorder, combined type: Secondary | ICD-10-CM

## 2023-07-04 DIAGNOSIS — F330 Major depressive disorder, recurrent, mild: Secondary | ICD-10-CM | POA: Diagnosis not present

## 2023-07-04 DIAGNOSIS — E785 Hyperlipidemia, unspecified: Secondary | ICD-10-CM

## 2023-07-04 DIAGNOSIS — Z23 Encounter for immunization: Secondary | ICD-10-CM

## 2023-07-04 DIAGNOSIS — Z125 Encounter for screening for malignant neoplasm of prostate: Secondary | ICD-10-CM

## 2023-07-04 DIAGNOSIS — I10 Essential (primary) hypertension: Secondary | ICD-10-CM

## 2023-07-04 MED ORDER — AMPHETAMINE-DEXTROAMPHET ER 20 MG PO CP24
20.0000 mg | ORAL_CAPSULE | ORAL | 0 refills | Status: DC
Start: 2023-07-04 — End: 2023-08-22

## 2023-07-04 MED ORDER — SERTRALINE HCL 25 MG PO TABS: ORAL_TABLET | ORAL | 0 refills | Status: DC

## 2023-07-04 MED ORDER — PRAVASTATIN SODIUM 40 MG PO TABS
40.0000 mg | ORAL_TABLET | Freq: Every day | ORAL | 3 refills | Status: DC
Start: 2023-07-04 — End: 2024-07-22

## 2023-07-04 MED ORDER — LISINOPRIL 10 MG PO TABS: 10.0000 mg | ORAL_TABLET | Freq: Every day | ORAL | 3 refills | Status: DC

## 2023-07-04 MED ORDER — DULOXETINE HCL 30 MG PO CPEP: 30.0000 mg | ORAL_CAPSULE | Freq: Every day | ORAL | 1 refills | Status: DC

## 2023-07-04 NOTE — Assessment & Plan Note (Signed)
Due to recheck lipids.

## 2023-07-04 NOTE — Assessment & Plan Note (Signed)
He feels like the sertraline is contributing to sexual dysfunction and so has tapered down to 100 mg of sertraline daily.  He says he really has not noticed a big shift and his mood is actually been okay but he is interested in maybe trying something different so we discussed tapering completely off the sertraline and then trying Cymbalta I cannot guarantee him that it will not have sexual side effects but usually we can find one that he will do well with.  So continue to work on healthy diet and regular exercise.  Given some information about 2 of our counselors here.

## 2023-07-04 NOTE — Patient Instructions (Addendum)
Went to stop the sertraline, please just follow the instructions on the bottle, then you can start the duloxetine the next day.  Also we have 2 great counselors here through [employer]  1 is Teofilo Pod and the other is Serafina Mitchell.

## 2023-07-04 NOTE — Progress Notes (Signed)
Established Patient Office Visit  Subjective   Patient ID: Stephen Orozco, male    DOB: 01/04/1971  Age: 52 y.o. MRN: [id number]  Chief Complaint  Patient presents with   Hypertension    HPI 6 mo f/u:  ADD - Reports symptoms are well controlled on current regime. Denies any problems with insomnia, chest pain, palpitations, or SOB.    Hypertension- Pt denies chest pain, SOB, dizziness, or heart palpitations.  Taking meds as directed w/o problems.  Denies medication side effects.    Did see dermatology earlier this month and will have a sebaceous cyst removed on his scalp.  Follow-up mood-he says that he started weaning his sertraline on his own because of sexual dysfunction because he was having some issues with the ED he felt like it could be part of the cause he was previously taking 2 to milligrams daily he is now taking 100 mg daily.    ROS    Objective:     BP 113/63   Pulse 85   Ht 6' (1.829 m)   Wt 277 lb (125.6 kg)   SpO2 95%   BMI 37.57 kg/m    Physical Exam Vitals and nursing note reviewed.  Constitutional:      Appearance: Normal appearance.  HENT:     Head: Normocephalic and atraumatic.  Eyes:     Conjunctiva/sclera: Conjunctivae normal.  Cardiovascular:     Rate and Rhythm: Normal rate and regular rhythm.  Pulmonary:     Effort: Pulmonary effort is normal.     Breath sounds: Normal breath sounds.  Skin:    General: Skin is warm and dry.  Neurological:     Mental Status: He is alert.  Psychiatric:        Mood and Affect: Mood normal.      No results found for any visits on 07/04/23.    The 10-year ASCVD risk score (Arnett DK, et al., 2019) is: 6.1%    Assessment & Plan:   Problem List Items Addressed This Visit       Cardiovascular and Mediastinum   Essential hypertension, benign    Well controlled. Continue current regimen. Follow up in  35mo       Relevant Medications   lisinopril (ZESTRIL) 10 MG tablet   pravastatin  (PRAVACHOL) 40 MG tablet   Other Relevant Orders   CMP14+EGFR   Lipid panel   CBC     Other   MDD (major depressive disorder)    He feels like the sertraline is contributing to sexual dysfunction and so has tapered down to 100 mg of sertraline daily.  He says he really has not noticed a big shift and his mood is actually been okay but he is interested in maybe trying something different so we discussed tapering completely off the sertraline and then trying Cymbalta I cannot guarantee him that it will not have sexual side effects but usually we can find one that he will do well with.  So continue to work on healthy diet and regular exercise.  Given some information about 2 of our counselors here.      Relevant Medications   sertraline (ZOLOFT) 25 MG tablet   DULoxetine (CYMBALTA) 30 MG capsule   Other Relevant Orders   CMP14+EGFR   Lipid panel   CBC   Hyperlipidemia    Due to recheck lipids      Relevant Medications   lisinopril (ZESTRIL) 10 MG tablet   pravastatin (PRAVACHOL) 40 MG tablet  Other Relevant Orders   CMP14+EGFR   Lipid panel   CBC   ADHD    Well controlled. Continue current regimen. Follow up in  30mo       Relevant Medications   amphetamine-dextroamphetamine (ADDERALL XR) 20 MG 24 hr capsule   Other Relevant Orders   CMP14+EGFR   Lipid panel   CBC   Other Visit Diagnoses     Encounter for immunization    -  Primary   Relevant Orders   Flu vaccine trivalent PF, 30mos and older(Flulaval,Afluria,Fluarix,Fluzone) (Completed)   Screening for malignant neoplasm of prostate       Relevant Orders   PSA       Return in about 2 months (around 09/03/2023) for New start medication.    Nani Gasser, MD

## 2023-07-04 NOTE — Assessment & Plan Note (Signed)
Well controlled. Continue current regimen. Follow up in  6 mo

## 2023-07-04 NOTE — Assessment & Plan Note (Signed)
Well controlled. Continue current regimen. Follow up in  6 mo  

## 2023-07-05 LAB — CMP14+EGFR
ALT: 41 IU/L (ref 0–44)
AST: 31 IU/L (ref 0–40)
Albumin: 4.7 g/dL (ref 3.8–4.9)
Alkaline Phosphatase: 76 IU/L (ref 44–121)
BUN/Creatinine Ratio: 14 (ref 9–20)
BUN: 13 mg/dL (ref 6–24)
Bilirubin Total: 0.6 mg/dL (ref 0.0–1.2)
CO2: 22 mmol/L (ref 20–29)
Calcium: 9.7 mg/dL (ref 8.7–10.2)
Chloride: 97 mmol/L (ref 96–106)
Creatinine, Ser: 0.91 mg/dL (ref 0.76–1.27)
Globulin, Total: 2.2 g/dL (ref 1.5–4.5)
Glucose: 101 mg/dL — ABNORMAL HIGH (ref 70–99)
Potassium: 4.4 mmol/L (ref 3.5–5.2)
Sodium: 135 mmol/L (ref 134–144)
Total Protein: 6.9 g/dL (ref 6.0–8.5)
eGFR: 101 mL/min/{1.73_m2} (ref >59)

## 2023-07-05 LAB — LIPID PANEL
Chol/HDL Ratio: 6.4 ratio — ABNORMAL HIGH (ref 0.0–5.0)
Cholesterol, Total: 216 mg/dL — ABNORMAL HIGH (ref 100–199)
HDL: 34 mg/dL — ABNORMAL LOW (ref >39)
LDL Chol Calc (NIH): 140 mg/dL — ABNORMAL HIGH (ref 0–99)
Triglycerides: 229 mg/dL — ABNORMAL HIGH (ref 0–149)
VLDL Cholesterol Cal: 42 mg/dL — ABNORMAL HIGH (ref 5–40)

## 2023-07-05 LAB — CBC
Hematocrit: 43.3 % (ref 37.5–51.0)
Hemoglobin: 15.2 g/dL (ref 13.0–17.7)
MCH: 32.4 pg (ref 26.6–33.0)
MCHC: 35.1 g/dL (ref 31.5–35.7)
MCV: 92 fL (ref 79–97)
Platelets: 142 10*3/uL — ABNORMAL LOW (ref 150–450)
RBC: 4.69 x10E6/uL (ref 4.14–5.80)
RDW: 13.5 % (ref 11.6–15.4)
WBC: 4.8 10*3/uL (ref 3.4–10.8)

## 2023-07-05 LAB — PSA: Prostate Specific Ag, Serum: 0.5 ng/mL (ref 0.0–4.0)

## 2023-07-05 NOTE — Progress Notes (Signed)
Hi Stephen Orozco, metabolic panel overall looks great.  LDL cholesterol and triglycerides and total cholesterol are elevated.  And your good cholesterol is low.  10-year cardiovascular risk was around 6% so just really encourage you work on healthy diet and regular exercise to improve those numbers.  Your blood count looks good.  Platelets are stable around 142,000 which is great.  Prostate test is normal.  The 10-year ASCVD risk score (Arnett DK, et al., 2019) is: 6.4%   Values used to calculate the score:     Age: 10 years     Sex: Male     Is Non-Hispanic African American: No     Diabetic: No     Tobacco smoker: No     Systolic Blood Pressure: 113 mmHg     Is BP treated: Yes     HDL Cholesterol: 34 mg/dL     Total Cholesterol: 216 mg/dL

## 2023-07-08 ENCOUNTER — Encounter: Payer: Self-pay | Admitting: Dermatology

## 2023-07-08 ENCOUNTER — Ambulatory Visit: Payer: BC Managed Care – PPO | Admitting: Dermatology

## 2023-07-08 DIAGNOSIS — L7211 Pilar cyst: Secondary | ICD-10-CM

## 2023-07-08 DIAGNOSIS — D485 Neoplasm of uncertain behavior of skin: Secondary | ICD-10-CM

## 2023-07-08 NOTE — Progress Notes (Signed)
   Follow-Up Visit   Subjective  Stephen Orozco is a 52 y.o. male who presents for the following: Excision of lesion on left parietal scalp  The following portions of the chart were reviewed this encounter and updated as appropriate: medications, allergies, medical history  Review of Systems:  No other skin or systemic complaints except as noted in HPI or Assessment and Plan.  Objective  Well appearing patient in no apparent distress; mood and affect are within normal limits.  A focused examination was performed of the following areas: scalp Relevant physical exam findings are noted in the Assessment and Plan.   Mid Parietal Scalp Firm subcutaneous nodule        Assessment & Plan   Neoplasm of uncertain behavior of skin Mid Parietal Scalp  Skin excision  Lesion length (cm):  2.5 Lesion width (cm):  2 Margin per side (cm):  0.1 Total excision diameter (cm):  2.7 Informed consent: discussed and consent obtained   Timeout: patient name, date of birth, surgical site, and procedure verified   Procedure prep:  Patient was prepped and draped in usual sterile fashion Prep type:  Chlorhexidine Anesthesia: the lesion was anesthetized in a standard fashion   Anesthetic:  1% lidocaine w/ epinephrine 1-100,000 local infiltration Instrument used: #15 blade   Hemostasis achieved with: suture and electrodesiccation   Hemostasis achieved with comment:  5.0 fast absorbing, 3.0 vicryl Outcome: patient tolerated procedure well with no complications   Post-procedure details: sterile dressing applied and wound care instructions given   Dressing type: petrolatum and pressure dressing    Skin repair Complexity:  Complex Final length (cm):  3.3 Informed consent: discussed and consent obtained   Timeout: patient name, date of birth, surgical site, and procedure verified   Procedure prep:  Patient was prepped and draped in usual sterile fashion Prep type:  Chlorhexidine Anesthesia:  the lesion was anesthetized in a standard fashion   Reason for type of repair: reduce tension to allow closure, reduce the risk of dehiscence, infection, and necrosis, reduce subcutaneous dead space and avoid a hematoma, preserve normal anatomy, preserve normal anatomical and functional relationships, allow side-to-side closure without requiring a flap or graft and compensate for the inelasticity of skin in this area   Undermining: area extensively undermined   Subcutaneous layers (deep stitches):  Suture size:  3-0 Suture type: Vicryl (polyglactin 910)   Stitches:  Buried vertical mattress Fine/surface layer approximation (top stitches):  Suture size:  5-0 Suture type: fast-absorbing plain gut   Stitches comment:  Running locked Hemostasis achieved with: suture and pressure Outcome: patient tolerated procedure well with no complications   Post-procedure details: sterile dressing applied and wound care instructions given   Dressing type: petrolatum and pressure dressing    Specimen 1 - Surgical pathology Differential Diagnosis: R/O cyst vs other  Check Margins: No    Return if symptoms worsen or fail to improve. I, Tillie Fantasia, CMA, am acting as scribe for Gwenith Daily, MD.   Documentation: I have reviewed the above documentation for accuracy and completeness, and I agree with the above.  Gwenith Daily, MD

## 2023-07-10 LAB — SURGICAL PATHOLOGY

## 2023-07-16 ENCOUNTER — Ambulatory Visit: Payer: BC Managed Care – PPO | Admitting: Dermatology

## 2023-08-14 ENCOUNTER — Encounter: Payer: Self-pay | Admitting: Family Medicine

## 2023-08-14 ENCOUNTER — Other Ambulatory Visit: Payer: Self-pay | Admitting: Family Medicine

## 2023-08-14 DIAGNOSIS — F902 Attention-deficit hyperactivity disorder, combined type: Secondary | ICD-10-CM

## 2023-08-22 ENCOUNTER — Other Ambulatory Visit: Payer: Self-pay | Admitting: Family Medicine

## 2023-08-22 ENCOUNTER — Encounter: Payer: Self-pay | Admitting: Family Medicine

## 2023-08-22 DIAGNOSIS — F902 Attention-deficit hyperactivity disorder, combined type: Secondary | ICD-10-CM

## 2023-08-22 MED ORDER — AMPHETAMINE-DEXTROAMPHET ER 20 MG PO CP24
20.0000 mg | ORAL_CAPSULE | ORAL | 0 refills | Status: DC
Start: 2023-08-22 — End: 2023-09-03

## 2023-08-22 MED ORDER — AMPHETAMINE-DEXTROAMPHET ER 20 MG PO CP24
20.0000 mg | ORAL_CAPSULE | ORAL | 0 refills | Status: DC
Start: 2023-09-20 — End: 2023-12-13

## 2023-09-03 ENCOUNTER — Encounter: Payer: Self-pay | Admitting: Family Medicine

## 2023-09-03 ENCOUNTER — Ambulatory Visit: Payer: BC Managed Care – PPO | Admitting: Family Medicine

## 2023-09-03 VITALS — BP 123/69 | HR 80 | Ht 72.0 in | Wt 283.0 lb

## 2023-09-03 DIAGNOSIS — R635 Abnormal weight gain: Secondary | ICD-10-CM

## 2023-09-03 DIAGNOSIS — F902 Attention-deficit hyperactivity disorder, combined type: Secondary | ICD-10-CM

## 2023-09-03 DIAGNOSIS — F330 Major depressive disorder, recurrent, mild: Secondary | ICD-10-CM

## 2023-09-03 DIAGNOSIS — Z6838 Body mass index (BMI) 38.0-38.9, adult: Secondary | ICD-10-CM | POA: Diagnosis not present

## 2023-09-03 MED ORDER — AMPHETAMINE-DEXTROAMPHET ER 20 MG PO CP24
20.0000 mg | ORAL_CAPSULE | ORAL | 0 refills | Status: DC
Start: 2023-11-19 — End: 2023-12-13

## 2023-09-03 MED ORDER — DULOXETINE HCL 30 MG PO CPEP: 30.0000 mg | ORAL_CAPSULE | Freq: Every day | ORAL | 1 refills | Status: DC

## 2023-09-03 MED ORDER — AMPHETAMINE-DEXTROAMPHET ER 25 MG PO CP24: 25.0000 mg | ORAL_CAPSULE | ORAL | 0 refills | Status: DC

## 2023-09-03 MED ORDER — AMPHETAMINE-DEXTROAMPHET ER 20 MG PO CP24
20.0000 mg | ORAL_CAPSULE | ORAL | 0 refills | Status: DC
Start: 2023-10-20 — End: 2023-12-13

## 2023-09-03 NOTE — Assessment & Plan Note (Signed)
Called pharmacy and they do have a 25 mg extended release Adderall in stock so we will get that sent over for this month.  I did go ahead and send in the regular dose for December and January but if we need to change those based on backorder issues we certainly can he can just reach out to [name].

## 2023-09-03 NOTE — Assessment & Plan Note (Signed)
Doing well so far on the Cymbalta so we will continue with current regimen for now 90-day supply sent to pharmacy follow-up again in 3 to 4 months.

## 2023-09-03 NOTE — Progress Notes (Signed)
Established Patient Office Visit  Subjective   Patient ID: Stephen Orozco, male    DOB: 02-Feb-1971  Age: 52 y.o. MRN: [id number]  Chief Complaint  Patient presents with   Medical Management of Chronic Issues    HPI  Follow-up mood.  He felt like the sertraline was contributing to some sexual dysfunction and so tapered down on the dose.  He did actually did not notice a big change in mood on the lower dose and so was interested in maybe trying something different.  We decided to switch to duloxetine.  Also recommended therapy/counseling.  So far he has been tolerating the Cymbalta well without any specific concerns he would like to continue with the medication for now.  ADD - Reports symptoms are well controlled on current regime. Denies any problems with insomnia, chest pain, palpitations, or SOB.  The pharmacy does not have 20 mg in stock so he is actually been off his medication for couple of months at this point.  We did call the pharmacy today and they do have 25 mg in stock so we will send that over for this month.  Also been struggling with weight gain he has been using a smart phone app which says he needs to eat around 21,000 cal a day he says he almost always eats less than that.  He does not have a specific exercise routine but he tries to stay active but he gets tired very easily.    ROS    Objective:     BP 123/69   Pulse 80   Ht 6' (1.829 m)   Wt 283 lb (128.4 kg)   SpO2 96%   BMI 38.38 kg/m    Physical Exam Vitals reviewed.  Constitutional:      Appearance: Normal appearance.  HENT:     Head: Normocephalic.  Pulmonary:     Effort: Pulmonary effort is normal.  Neurological:     Mental Status: He is alert and oriented to person, place, and time.  Psychiatric:        Mood and Affect: Mood normal.        Behavior: Behavior normal.      No results found for any visits on 09/03/23.    The 10-year ASCVD risk score (Arnett DK, et al., 2019) is:  7.4%    Assessment & Plan:   Problem List Items Addressed This Visit       Other   MDD (major depressive disorder)    Doing well so far on the Cymbalta so we will continue with current regimen for now 90-day supply sent to pharmacy follow-up again in 3 to 4 months.      Relevant Medications   DULoxetine (CYMBALTA) 30 MG capsule   BMI 38.0-38.9,adult    Abnormal weight gain-he really feels like he eats very healthy and has not made any changes recently but continues to to gain weight.  He is actually up about 5 pounds since I last saw him.  He tries to stay active but does not have a lot of energy for exercise.  Will check a thyroid level today just to make sure there is no sign of hypothyroidism.  Could consider a more formal weight loss program like healthy weight and wellness that might be able to better customize a strategy for him and help him be successful.      ADHD - Primary    Called pharmacy and they do have a 25 mg extended release Adderall in  stock so we will get that sent over for this month.  I did go ahead and send in the regular dose for December and January but if we need to change those based on backorder issues we certainly can he can just reach out to [name].      Relevant Medications   amphetamine-dextroamphetamine (ADDERALL XR) 20 MG 24 hr capsule (Start on 10/20/2023)   amphetamine-dextroamphetamine (ADDERALL XR) 20 MG 24 hr capsule (Start on 11/19/2023)   Other Visit Diagnoses     Abnormal weight gain       Relevant Orders   TSH       Return in about 3 months (around 12/04/2023) for mood.    Nani Gasser, MD

## 2023-09-03 NOTE — Assessment & Plan Note (Signed)
Abnormal weight gain-he really feels like he eats very healthy and has not made any changes recently but continues to to gain weight.  He is actually up about 5 pounds since I last saw him.  He tries to stay active but does not have a lot of energy for exercise.  Will check a thyroid level today just to make sure there is no sign of hypothyroidism.  Could consider a more formal weight loss program like healthy weight and wellness that might be able to better customize a strategy for him and help him be successful.

## 2023-09-04 LAB — TSH: TSH: 2 u[IU]/mL (ref 0.450–4.500)

## 2023-09-04 NOTE — Progress Notes (Signed)
Your lab work is within acceptable range and there are no concerning findings.   ?

## 2023-09-05 ENCOUNTER — Telehealth: Payer: Self-pay

## 2023-09-05 NOTE — Telephone Encounter (Addendum)
Initiated Prior authorization FIE:[id number] ER 25MG  er capsules Via: Covermymeds Case/Key:[id number] Status: Pending as of 09/05/23- resubmission 09/19/23 Reason: Notified Pt via: Mychart

## 2023-09-10 ENCOUNTER — Encounter: Payer: Self-pay | Admitting: Dermatology

## 2023-09-10 ENCOUNTER — Ambulatory Visit: Payer: BC Managed Care – PPO | Admitting: Dermatology

## 2023-09-10 VITALS — BP 148/100 | HR 87

## 2023-09-10 DIAGNOSIS — L080 Pyoderma: Secondary | ICD-10-CM | POA: Diagnosis not present

## 2023-09-10 DIAGNOSIS — C4492 Squamous cell carcinoma of skin, unspecified: Secondary | ICD-10-CM

## 2023-09-10 DIAGNOSIS — L578 Other skin changes due to chronic exposure to nonionizing radiation: Secondary | ICD-10-CM | POA: Diagnosis not present

## 2023-09-10 DIAGNOSIS — L814 Other melanin hyperpigmentation: Secondary | ICD-10-CM

## 2023-09-10 DIAGNOSIS — D0461 Carcinoma in situ of skin of right upper limb, including shoulder: Secondary | ICD-10-CM

## 2023-09-10 DIAGNOSIS — D492 Neoplasm of unspecified behavior of bone, soft tissue, and skin: Secondary | ICD-10-CM

## 2023-09-10 DIAGNOSIS — Z808 Family history of malignant neoplasm of other organs or systems: Secondary | ICD-10-CM

## 2023-09-10 DIAGNOSIS — L821 Other seborrheic keratosis: Secondary | ICD-10-CM

## 2023-09-10 DIAGNOSIS — Z1283 Encounter for screening for malignant neoplasm of skin: Secondary | ICD-10-CM

## 2023-09-10 DIAGNOSIS — D229 Melanocytic nevi, unspecified: Secondary | ICD-10-CM

## 2023-09-10 DIAGNOSIS — W908XXA Exposure to other nonionizing radiation, initial encounter: Secondary | ICD-10-CM

## 2023-09-10 DIAGNOSIS — D1801 Hemangioma of skin and subcutaneous tissue: Secondary | ICD-10-CM

## 2023-09-10 DIAGNOSIS — D485 Neoplasm of uncertain behavior of skin: Secondary | ICD-10-CM

## 2023-09-10 HISTORY — DX: Squamous cell carcinoma of skin, unspecified: C44.92

## 2023-09-10 NOTE — Patient Instructions (Addendum)

## 2023-09-10 NOTE — Progress Notes (Signed)
Total Body Skin Exam (TBSE) Visit   Subjective  Stephen Orozco is a 52 y.o. male who presents for the following: Skin Cancer Screening and Full Body Skin Exam  Patient presents today for follow up visit for TBSE. Patient was last evaluated on 07/08/23 . Patient denies medication changes. Patient reports he  does not have spots, moles and lesions of concern to be evaluated. Patient reports throughout his lifetime he  has had  severe  sun exposure. Currently, patient reports if he  has excessive sun exposure, he  does apply sunscreen and/or wears protective coverings. Patient reports he  does not have hx of bx. Patient reports  family history of skin cancers. The patient has spots, moles and lesions to be evaluated, some may be new or changing and the patient has concerns that these could be cancer.  The following portions of the chart were reviewed this encounter and updated as appropriate: medications, allergies, medical history  Review of Systems:  No other skin or systemic complaints except as noted in HPI or Assessment and Plan.  Objective  Well appearing patient in no apparent distress; mood and affect are within normal limits.  A full examination was performed including scalp, head, eyes, ears, nose, lips, neck, chest, axillae, abdomen, back, buttocks, bilateral upper extremities, bilateral lower extremities, hands, feet, fingers, toes, fingernails, and toenails. All findings within normal limits unless otherwise noted below.   Relevant physical exam findings are noted in the Assessment and Plan.            Assessment & Plan   LENTIGINES, SEBORRHEIC KERATOSES, HEMANGIOMAS - Benign normal skin lesions - Benign-appearing - Call for any changes  MELANOCYTIC NEVI - Tan-brown and/or pink-flesh-colored symmetric macules and papules - Benign appearing on exam today - Observation - Call clinic for new or changing moles - Recommend daily use of broad spectrum spf 30+ sunscreen to  sun-exposed areas.   ACTINIC DAMAGE - Chronic condition, secondary to cumulative UV/sun exposure - diffuse scaly erythematous macules with underlying dyspigmentation - Recommend daily broad spectrum sunscreen SPF 30+ to sun-exposed areas, reapply every 2 hours as needed.  - Staying in the shade or wearing long sleeves, sun glasses (UVA+UVB protection) and wide brim hats (4-inch brim around the entire circumference of the hat) are also recommended for sun protection.  - Call for new or changing lesions.  Neoplasm of uncertain behavior of skin (2) Right Forehead  Skin / nail biopsy Type of biopsy: tangential   Informed consent: discussed and consent obtained   Timeout: patient name, date of birth, surgical site, and procedure verified   Procedure prep:  Patient was prepped and draped in usual sterile fashion Prep type:  Isopropyl alcohol Anesthesia: the lesion was anesthetized in a standard fashion   Anesthetic:  1% lidocaine w/ epinephrine 1-100,000 buffered w/ 8.4% NaHCO3 Instrument used: DermaBlade   Hemostasis achieved with: aluminum chloride   Outcome: patient tolerated procedure well   Post-procedure details: sterile dressing applied and wound Orozco instructions given   Dressing type: petrolatum gauze and bandage    Specimen A - Surgical pathology Differential Diagnosis: BCC  Check Margins: No  Right Shoulder - Anterior  Skin / nail biopsy Type of biopsy: tangential   Informed consent: discussed and consent obtained   Timeout: patient name, date of birth, surgical site, and procedure verified   Procedure prep:  Patient was prepped and draped in usual sterile fashion Prep type:  Isopropyl alcohol Anesthesia: the lesion was anesthetized in a  standard fashion   Anesthetic:  1% lidocaine w/ epinephrine 1-100,000 buffered w/ 8.4% NaHCO3 Instrument used: DermaBlade   Hemostasis achieved with: aluminum chloride   Outcome: patient tolerated procedure well   Post-procedure  details: sterile dressing applied and wound Orozco instructions given   Dressing type: petrolatum gauze and bandage    Specimen B - Surgical pathology Differential Diagnosis: SCC vs ISK vs Eczema  Check Margins: No  SKIN CANCER SCREENING PERFORMED TODAY. No follow-ups on file.    Documentation: I have reviewed the above documentation for accuracy and completeness, and I agree with the above.  Stasia Cavalier, am acting as scribe for Langston Reusing, DO.  Langston Reusing, DO

## 2023-09-11 ENCOUNTER — Telehealth: Payer: Self-pay

## 2023-09-11 NOTE — Telephone Encounter (Signed)
Copied from CRM [id number]. Topic: Clinical - Medication Question >> Sep 11, 2023  4:00 PM Desma Mcgregor wrote: Reason for CRM: Stephen Orozco called in about the sts of the Prior Auth for amphetamine-dextroamphetamine (ADDERALL XR) 25 MG 24 hr capsule. Patient hasn't had this medication since 10/31. Please f/u asap at cb# [phone].

## 2023-09-13 LAB — SURGICAL PATHOLOGY

## 2023-09-20 ENCOUNTER — Encounter: Payer: Self-pay | Admitting: Family Medicine

## 2023-09-20 DIAGNOSIS — N529 Male erectile dysfunction, unspecified: Secondary | ICD-10-CM

## 2023-09-20 MED ORDER — TADALAFIL 20 MG PO TABS: 20.0000 mg | ORAL_TABLET | Freq: Every day | ORAL | 5 refills | Status: AC | PRN

## 2023-09-20 NOTE — Telephone Encounter (Signed)
Cialis rx not listed in active med list.

## 2023-09-20 NOTE — Telephone Encounter (Signed)
See mychart note.   Please call ppharmacy and find out what is going on with his Adderall?

## 2023-09-26 ENCOUNTER — Encounter: Payer: Self-pay | Admitting: Family Medicine

## 2023-09-26 ENCOUNTER — Telehealth: Payer: Self-pay

## 2023-09-26 NOTE — Progress Notes (Signed)
Hi Shirron,  Please call pt and notify their bx results from his shoulder was positive for a superficial skin CA that will be treated by Dr. [name] the bx on his forehead was benign.  Please make sure he has a follow up scheduled for January pr February.  (It can be a regular appointment slot)

## 2023-09-26 NOTE — Telephone Encounter (Signed)
-----   Message from Langston Reusing sent at 09/26/2023  7:16 AM EST ----- Hi Shirron,  Please call pt and notify their bx results from his shoulder was positive for a superficial skin CA that will be treated by Dr. [name] the bx on his forehead was benign.  Please make sure he has a follow up scheduled for January pr February.  (It can be a regular appointment slot)

## 2023-09-26 NOTE — Telephone Encounter (Signed)
Left message for patient to call office for results/hd 

## 2023-10-04 ENCOUNTER — Encounter: Payer: Self-pay | Admitting: Dermatology

## 2023-10-07 ENCOUNTER — Telehealth: Payer: Self-pay

## 2023-10-07 DIAGNOSIS — D099 Carcinoma in situ, unspecified: Secondary | ICD-10-CM | POA: Insufficient documentation

## 2023-10-07 HISTORY — DX: Carcinoma in situ, unspecified: D09.9

## 2023-10-07 NOTE — Telephone Encounter (Signed)
Morning-   Per Dr. [name]:    "The skin cancer on the shoulder was superfical and will be treated at his next appointment and the bx on his forehead was completely benign."   Shareece Bultman, CMA  This MyChart message has not been read. Terri Piedra, DO to Me     10/07/23  6:33 AM Note Please call patient and reassure him that the skin cancer on the shoulder was superfical and will be treated at his next appointment and the bx on his forehead was completely benign. Thanks   1. Skin, right forehead :       SUPPURATIVE GRANULOMATOUS DERMATITIS         2. Skin, right shoulder - anterior :       SQUAMOUS CELL CARCINOMA IN SITU         October 04, 2023 Ernestine Conrad to [facility] (supporting Terri Piedra, DO)      10/04/23 12:33 PM After receiving the lab results, I received a phone message to learn more.  I called back twice a week ago, but never got a call back.  Do I need to follow up about my shoulder?  My forehead healed after a few days, and the cutting from my shoulder is still closing up.  The pathology was concerning to me.  My Uncle (Mom's brother) died from skin cancer.

## 2023-10-07 NOTE — Telephone Encounter (Signed)
Please call patient and reassure him that the skin cancer on the shoulder was superfical and will be treated at his next appointment and the bx on his forehead was completely benign. Thanks  1. Skin, right forehead :       SUPPURATIVE GRANULOMATOUS DERMATITIS        2. Skin, right shoulder - anterior :       SQUAMOUS CELL CARCINOMA IN SITU

## 2023-10-22 ENCOUNTER — Other Ambulatory Visit: Payer: Self-pay | Admitting: Family Medicine

## 2023-10-22 DIAGNOSIS — F902 Attention-deficit hyperactivity disorder, combined type: Secondary | ICD-10-CM

## 2023-10-23 NOTE — Telephone Encounter (Signed)
 Looks like 3 on fil. One for Jan and Feb.

## 2023-11-12 ENCOUNTER — Telehealth: Payer: Self-pay

## 2023-11-12 NOTE — Telephone Encounter (Signed)
-----  Message from Langston Reusing sent at 09/26/2023  7:16 AM EST ----- Hi Shirron,  Please call pt and notify their bx results from his shoulder was positive for a superficial skin CA that will be treated by Dr. [name] the bx on his forehead was benign.  Please make sure he has a follow up scheduled for January pr February.  (It can be a regular appointment slot)

## 2023-11-12 NOTE — Telephone Encounter (Signed)
Called patient today regarding biopsy results. His voice mailbox is full so I could not leave a message/hd

## 2023-12-04 ENCOUNTER — Ambulatory Visit: Payer: BC Managed Care – PPO | Admitting: Family Medicine

## 2023-12-13 ENCOUNTER — Ambulatory Visit (INDEPENDENT_AMBULATORY_CARE_PROVIDER_SITE_OTHER): Payer: 59 | Admitting: Family Medicine

## 2023-12-13 ENCOUNTER — Encounter: Payer: Self-pay | Admitting: Family Medicine

## 2023-12-13 VITALS — BP 123/78 | HR 82 | Ht 72.0 in | Wt 290.0 lb

## 2023-12-13 DIAGNOSIS — F330 Major depressive disorder, recurrent, mild: Secondary | ICD-10-CM | POA: Diagnosis not present

## 2023-12-13 DIAGNOSIS — I10 Essential (primary) hypertension: Secondary | ICD-10-CM

## 2023-12-13 DIAGNOSIS — F902 Attention-deficit hyperactivity disorder, combined type: Secondary | ICD-10-CM | POA: Diagnosis not present

## 2023-12-13 DIAGNOSIS — Z6839 Body mass index (BMI) 39.0-39.9, adult: Secondary | ICD-10-CM | POA: Diagnosis not present

## 2023-12-13 MED ORDER — AMPHETAMINE-DEXTROAMPHET ER 20 MG PO CP24: 20.0000 mg | ORAL_CAPSULE | ORAL | 0 refills | Status: DC

## 2023-12-13 NOTE — Patient Instructions (Signed)
 My Fitness Pal or Lose It apps to track calories

## 2023-12-13 NOTE — Assessment & Plan Note (Signed)
 Doing well on current regimen.    Flowsheet Row Office Visit from 12/13/2023 in [facility] at [facility]  PHQ-9 Total Score 6

## 2023-12-13 NOTE — Assessment & Plan Note (Signed)
 Happy with current dosing regimen.

## 2023-12-13 NOTE — Assessment & Plan Note (Signed)
 Well controlled. Continue current regimen. Follow up in  6 mo

## 2023-12-13 NOTE — Progress Notes (Signed)
   Established Patient Office Visit  Subjective  Patient ID: Stephen Orozco, male    DOB: May 27, 1971  Age: 53 y.o. MRN: [id number]  Chief Complaint  Patient presents with   mood    HPI    ROS    Objective:     BP 123/78   Pulse 82   Ht 6' (1.829 m)   Wt 290 lb (131.5 kg)   SpO2 98%   BMI 39.33 kg/m    Physical Exam Vitals and nursing note reviewed.  Constitutional:      Appearance: Normal appearance.  HENT:     Head: Normocephalic and atraumatic.  Eyes:     Conjunctiva/sclera: Conjunctivae normal.  Cardiovascular:     Rate and Rhythm: Normal rate and regular rhythm.  Pulmonary:     Effort: Pulmonary effort is normal.     Breath sounds: Normal breath sounds.  Skin:    General: Skin is warm and dry.  Neurological:     Mental Status: He is alert.  Psychiatric:        Mood and Affect: Mood normal.      No results found for any visits on 12/13/23.    The 10-year ASCVD risk score (Arnett DK, et al., 2019) is: 8%    Assessment & Plan:   Problem List Items Addressed This Visit       Cardiovascular and Mediastinum   Essential hypertension, benign   Well controlled. Continue current regimen. Follow up in  70mo       Relevant Orders   CMP14+EGFR     Other   MDD (major depressive disorder)   Doing well on current regimen.    Flowsheet Row Office Visit from 12/13/2023 in [facility] at [facility]  PHQ-9 Total Score 6            ADHD   Happy with current dosing regimen.       Relevant Medications   amphetamine-dextroamphetamine (ADDERALL XR) 20 MG 24 hr capsule (Start on 01/07/2025)   amphetamine-dextroamphetamine (ADDERALL XR) 20 MG 24 hr capsule (Start on 02/08/2024)   amphetamine-dextroamphetamine (ADDERALL XR) 20 MG 24 hr capsule (Start on 03/09/2024)   Other Visit Diagnoses       BMI 39.0-39.9,adult    -  Primary       Return in about 6 months (around 06/11/2024) for ADD, MOod .     Nani Gasser, MD

## 2023-12-14 LAB — CMP14+EGFR
ALT: 36 IU/L (ref 0–44)
AST: 23 IU/L (ref 0–40)
Albumin: 4.6 g/dL (ref 3.8–4.9)
Alkaline Phosphatase: 77 IU/L (ref 44–121)
BUN/Creatinine Ratio: 14 (ref 9–20)
BUN: 11 mg/dL (ref 6–24)
Bilirubin Total: 0.4 mg/dL (ref 0.0–1.2)
CO2: 26 mmol/L (ref 20–29)
Calcium: 9.6 mg/dL (ref 8.7–10.2)
Chloride: 100 mmol/L (ref 96–106)
Creatinine, Ser: 0.77 mg/dL (ref 0.76–1.27)
Globulin, Total: 2.3 g/dL (ref 1.5–4.5)
Glucose: 107 mg/dL — ABNORMAL HIGH (ref 70–99)
Potassium: 4.4 mmol/L (ref 3.5–5.2)
Sodium: 136 mmol/L (ref 134–144)
Total Protein: 6.9 g/dL (ref 6.0–8.5)
eGFR: 107 mL/min/{1.73_m2} (ref >59)

## 2023-12-16 ENCOUNTER — Encounter: Payer: Self-pay | Admitting: Family Medicine

## 2023-12-16 NOTE — Progress Notes (Signed)
 Your lab work is within acceptable range and there are no concerning findings.   ?

## 2024-01-21 ENCOUNTER — Other Ambulatory Visit (HOSPITAL_COMMUNITY): Payer: Self-pay

## 2024-01-21 ENCOUNTER — Telehealth: Payer: Self-pay

## 2024-01-21 NOTE — Telephone Encounter (Signed)
 Prior Authorization form/request asks a question that requires your assistance. Please see the question below and advise accordingly. The PA will not be submitted until the necessary information is received.   Unable to run test claim or submit PA for rx that begins 01/07/25

## 2024-01-22 ENCOUNTER — Other Ambulatory Visit: Payer: Self-pay

## 2024-01-22 DIAGNOSIS — F902 Attention-deficit hyperactivity disorder, combined type: Secondary | ICD-10-CM

## 2024-01-22 NOTE — Progress Notes (Signed)
 Message sent to provider. Thank you [place], Verdell Carmine, [employer] to Me     01/22/24  1:44 PM The issue is the most current rx is dated 01/12/25 Me to Althea Charon, [facility]     01/22/24 10:17 AM Hi,  The picture is not clear and does the provider or the pt need to answer the question? Pls advise thank you Roselyn Reef, CMA

## 2024-01-22 NOTE — Progress Notes (Signed)
 I called the pharmacy and they state patient picked up prescription 2 days ago.

## 2024-01-22 NOTE — Progress Notes (Signed)
 Please call pharmacy and have them fill the one from March.

## 2024-02-12 ENCOUNTER — Ambulatory Visit: Payer: Self-pay | Admitting: Family Medicine

## 2024-02-12 NOTE — Telephone Encounter (Signed)
 Chief Complaint: Fatigue for past few days  Symptoms: "Gets winded easily," "a little SOB," weight gain Frequency: Ongoing issue, worsening Pertinent Negatives: Patient denies difficulty walking, signs of dehydration  Disposition:  [x] Appointment (In office)  Additional Notes: Spoke with pt's wife, Bridgette Campus. Pt has said he feels really tired over the past few days. Pt wife states he has suffered from depression before but this seems different. Pt uses a CPAP machine but pt wife states he is sleeping well. Pt has not started any new medications recently. This RN scheduled pt for an appt tmrw. This RN educated pt wife on new-worsening symptoms and when to call back/seek emergent care. Pt wife verbalized understanding and agrees to plan.   Copied from CRM [id number]. Topic: Clinical - Red Word Triage >> Feb 12, 2024  2:52 PM Brittney F wrote: [facility] that prompted transfer to Nurse Triage:  Extreme fatigued; weakness  Symptoms started in the past week Reason for Disposition  [1] MODERATE weakness (i.e., interferes with work, school, normal activities) AND [2] persists > 3 days  Answer Assessment - Initial Assessment Questions Chief Complaint: Fatigue for past few days  Symptoms: "Gets winded easily," "a little SOB," weight gain  Frequency: Ongoing issue, worsening  Pertinent Negatives: Patient denies difficulty walking, signs of dehydration  Protocols used: Weakness (Generalized) and Fatigue-A-AH

## 2024-02-13 ENCOUNTER — Ambulatory Visit

## 2024-02-13 ENCOUNTER — Ambulatory Visit (INDEPENDENT_AMBULATORY_CARE_PROVIDER_SITE_OTHER): Admitting: Family Medicine

## 2024-02-13 ENCOUNTER — Encounter: Payer: Self-pay | Admitting: Family Medicine

## 2024-02-13 VITALS — BP 118/71 | HR 72 | Ht 72.0 in | Wt 281.0 lb

## 2024-02-13 DIAGNOSIS — E785 Hyperlipidemia, unspecified: Secondary | ICD-10-CM

## 2024-02-13 DIAGNOSIS — R5383 Other fatigue: Secondary | ICD-10-CM

## 2024-02-13 DIAGNOSIS — M533 Sacrococcygeal disorders, not elsewhere classified: Secondary | ICD-10-CM

## 2024-02-13 DIAGNOSIS — I10 Essential (primary) hypertension: Secondary | ICD-10-CM

## 2024-02-13 NOTE — Assessment & Plan Note (Signed)
 No other red flags in history or exam.  Checking labs today.  Reports compliance with CPAP Orders Placed This Encounter  Procedures   DG Sacrum/Coccyx    Standing Status:   Future    Number of Occurrences:   1    Expiration Date:   02/12/2025    Reason for Exam (SYMPTOM  OR DIAGNOSIS REQUIRED):   sacrum/coccyx pain    Preferred imaging location?:   [facility]   CMP14+EGFR   CBC with Differential/Platelet   TSH + free T4   B12   Iron, TIBC and Ferritin Panel   Vitamin D  (25 hydroxy)

## 2024-02-13 NOTE — Progress Notes (Signed)
 Stephen Orozco - 53 y.o. male MRN [id number]  Date of birth: [birth year]  Subjective Chief Complaint  Patient presents with   Fatigue   Tailbone Pain    HPI Stephen Orozco is a 53 y.o. male here today with complaint of fatigue.  His fatigue is chronic but notes that it has worsened some over the past couple weeks so his wife made him come in.  He does have history of sleep apnea and reports compliance with CPAP.   Continues to feel sleepy throughout the day.  No changes to medications or diet recently.  He has not had chest pain, shortness of breath, palpitations, lower extremity swelling, fever or significant weight change.  He is up-to-date with appropriate cancer screenings.  He also has pain around his tailbone area.  This is worse when sitting for prolonged periods of time.  Also worse when for standing up.  After walking this seems to get better.  No changes to bowels.  She will speak in once or down make sure not have a box  Same or skip for next week unless she would like she will Melzer next week Feel mL we can get mL today statical probably does hold off     ROS:  A comprehensive ROS was completed and negative except as noted per HPI  Allergies  Allergen Reactions   Flexeril  [Cyclobenzaprine ] Other (See Comments)    Excess sedation   Adhesive [Tape] Rash    Past Medical History:  Diagnosis Date   ADHD    Anxiety    Arthritis    Depression    Hyperlipidemia    Hypertension    Sleep apnea    cpap   Squamous cell carcinoma in situ (SCCIS) 10/07/2023   Right shoulder, anterior - ED&C needed    Squamous cell carcinoma of skin 09/10/2023   Right ant shoulder - needs EDC    Past Surgical History:  Procedure Laterality Date   APPENDECTOMY     medial malleolar fixation Right    Ankle   MENISCUS REPAIR Left 2010    Social History   Socioeconomic History   Marital status: Married    Spouse name: Not on file   Number of children: 2   Years of education: Not  on file   Highest education level: Bachelor's degree (e.g., BA, AB, BS)  Occupational History   Occupation: Teacher  Tobacco Use   Smoking status: Former    Current packs/day: 0.00    Average packs/day: 0.3 packs/day for 10.0 years (3.0 ttl pk-yrs)    Types: Cigarettes    Start date: 09/14/2002    Quit date: 09/14/2012    Years since quitting: 11.4    Passive exposure: Never   Smokeless tobacco: Never  Vaping Use   Vaping status: Never Used  Substance and Sexual Activity   Alcohol use: Yes    Alcohol/week: 2.0 - 3.0 standard drinks of alcohol    Types: 2 - 3 Standard drinks or equivalent per week    Comment: Occasional use   Drug use: No   Sexual activity: Yes    Partners: Female    Birth control/protection: None, Surgical  Other Topics Concern   Not on file  Social History Narrative   1 half caff drink daily. No regular exercise. Wife is a Education officer, museum.  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   Social Drivers of Health   Financial Resource Strain: Medium Risk (12/11/2023)   Overall Financial  Resource Strain (CARDIA)    Difficulty of Paying Living Expenses: Somewhat hard  Food Insecurity: Food Insecurity Present (12/11/2023)   Hunger Vital Sign    Worried About Running Out of Food in the Last Year: Sometimes true    Ran Out of Food in the Last Year: Never true  Transportation Needs: Unmet Transportation Needs (12/11/2023)   PRAPARE - Transportation    Lack of Transportation (Medical): No    Lack of Transportation (Non-Medical): Yes  Physical Activity: Sufficiently Active (12/11/2023)   Exercise Vital Sign    Days of Exercise per Week: 4 days    Minutes of Exercise per Session: 90 min  Stress: No Stress Concern Present (12/11/2023)   [employer] of Occupational Health - Occupational Stress Questionnaire    Feeling of Stress : Only a little  Social Connections: Moderately Integrated (12/11/2023)   Social Connection and  Isolation Panel [NHANES]    Frequency of Communication with Friends and Family: Twice a week    Frequency of Social Gatherings with Friends and Family: Once a week    Attends Religious Services: 1 to 4 times per year    Active Member of [employer] or Organizations: No    Attends Engineer, structural: Not on file    Marital Status: Married    Family History  Problem Relation Age of Onset   Alcohol abuse Father    Diabetes Father    Hyperlipidemia Father    Hypertension Father    Gout Father    Bipolar disorder Father    Autism spectrum disorder Son    ADD / ADHD Son    Cancer Other    Heart attack Other    Depression Other    Colon cancer Neg Hx    Colon polyps Neg Hx    Esophageal cancer Neg Hx    Stomach cancer Neg Hx    Rectal cancer Neg Hx     Health Maintenance  Topic Date Due   COVID-19 Vaccine (4 - 2024-25 season) 06/16/2023   INFLUENZA VACCINE  05/15/2024   DTaP/Tdap/Td (3 - Td or Tdap) 08/30/2029   Colonoscopy  11/18/2030   Hepatitis C Screening  Completed   HIV Screening  Completed   Zoster Vaccines- Shingrix  Completed   Pneumococcal Vaccine 33-57 Years old  Aged Out   HPV VACCINES  Aged Out   Meningococcal B Vaccine  Aged Out     ----------------------------------------------------------------------------------------------------------------------------------------------------------------------------------------------------------------- Physical Exam BP 118/71 (BP Location: Left Arm, Patient Position: Sitting, Cuff Size: Large)   Pulse 72   Ht 6' (1.829 m)   Wt 281 lb (127.5 kg)   SpO2 99%   BMI 38.11 kg/m   Physical Exam Constitutional:      Appearance: Normal appearance.  HENT:     Head: Normocephalic and atraumatic.  Eyes:     General: No scleral icterus. Cardiovascular:     Rate and Rhythm: Normal rate and regular rhythm.  Pulmonary:     Effort: Pulmonary effort is normal.     Breath sounds: Normal breath sounds.  Musculoskeletal:      Cervical back: Neck supple.  Neurological:     Mental Status: He is alert.  Psychiatric:        Mood and Affect: Mood normal.        Behavior: Behavior normal.     ------------------------------------------------------------------------------------------------------------------------------------------------------------------------------------------------------------------- Assessment and Plan  Coccydynia X-rays of sacrum and coccyx ordered today.   Other fatigue No other red flags in history or exam.  Checking  labs today.  Reports compliance with CPAP Orders Placed This Encounter  Procedures   DG Sacrum/Coccyx    Standing Status:   Future    Number of Occurrences:   1    Expiration Date:   02/12/2025    Reason for Exam (SYMPTOM  OR DIAGNOSIS REQUIRED):   sacrum/coccyx pain    Preferred imaging location?:   [facility]   CMP14+EGFR   CBC with Differential/Platelet   TSH + free T4   B12   Iron, TIBC and Ferritin Panel   Vitamin D  (25 hydroxy)     No orders of the defined types were placed in this encounter.   No follow-ups on file.

## 2024-02-13 NOTE — Assessment & Plan Note (Signed)
 X-rays of sacrum and coccyx ordered today.

## 2024-02-14 LAB — CBC WITH DIFFERENTIAL/PLATELET
Basophils Absolute: 0 10*3/uL (ref 0.0–0.2)
Basos: 1 %
EOS (ABSOLUTE): 0.1 10*3/uL (ref 0.0–0.4)
Eos: 2 %
Hematocrit: 43.6 % (ref 37.5–51.0)
Hemoglobin: 14.8 g/dL (ref 13.0–17.7)
Immature Grans (Abs): 0 10*3/uL (ref 0.0–0.1)
Immature Granulocytes: 0 %
Lymphocytes Absolute: 2.1 10*3/uL (ref 0.7–3.1)
Lymphs: 41 %
MCH: 31.3 pg (ref 26.6–33.0)
MCHC: 33.9 g/dL (ref 31.5–35.7)
MCV: 92 fL (ref 79–97)
Monocytes Absolute: 0.5 10*3/uL (ref 0.1–0.9)
Monocytes: 9 %
Neutrophils Absolute: 2.4 10*3/uL (ref 1.4–7.0)
Neutrophils: 47 %
Platelets: 155 10*3/uL (ref 150–450)
RBC: 4.73 x10E6/uL (ref 4.14–5.80)
RDW: 13 % (ref 11.6–15.4)
WBC: 5.2 10*3/uL (ref 3.4–10.8)

## 2024-02-14 LAB — CMP14+EGFR
ALT: 42 IU/L (ref 0–44)
AST: 26 IU/L (ref 0–40)
Albumin: 5 g/dL — ABNORMAL HIGH (ref 3.8–4.9)
Alkaline Phosphatase: 76 IU/L (ref 44–121)
BUN/Creatinine Ratio: 14 (ref 9–20)
BUN: 13 mg/dL (ref 6–24)
Bilirubin Total: 0.5 mg/dL (ref 0.0–1.2)
CO2: 22 mmol/L (ref 20–29)
Calcium: 9.2 mg/dL (ref 8.7–10.2)
Chloride: 98 mmol/L (ref 96–106)
Creatinine, Ser: 0.94 mg/dL (ref 0.76–1.27)
Globulin, Total: 1.8 g/dL (ref 1.5–4.5)
Glucose: 113 mg/dL — ABNORMAL HIGH (ref 70–99)
Potassium: 4.6 mmol/L (ref 3.5–5.2)
Sodium: 135 mmol/L (ref 134–144)
Total Protein: 6.8 g/dL (ref 6.0–8.5)
eGFR: 97 mL/min/{1.73_m2} (ref >59)

## 2024-02-14 LAB — IRON,TIBC AND FERRITIN PANEL
Ferritin: 313 ng/mL (ref 30–400)
Iron Saturation: 35 % (ref 15–55)
Iron: 111 ug/dL (ref 38–169)
Total Iron Binding Capacity: 315 ug/dL (ref 250–450)
UIBC: 204 ug/dL (ref 111–343)

## 2024-02-14 LAB — VITAMIN B12: Vitamin B-12: 725 pg/mL (ref 232–1245)

## 2024-02-14 LAB — VITAMIN D 25 HYDROXY (VIT D DEFICIENCY, FRACTURES): Vit D, 25-Hydroxy: 32.6 ng/mL (ref 30.0–100.0)

## 2024-02-14 LAB — TSH+FREE T4
Free T4: 0.77 ng/dL — ABNORMAL LOW (ref 0.82–1.77)
TSH: 2.06 u[IU]/mL (ref 0.450–4.500)

## 2024-02-19 ENCOUNTER — Encounter: Payer: Self-pay | Admitting: Family Medicine

## 2024-06-04 ENCOUNTER — Encounter: Payer: Self-pay | Admitting: Family Medicine

## 2024-06-04 DIAGNOSIS — F902 Attention-deficit hyperactivity disorder, combined type: Secondary | ICD-10-CM

## 2024-06-04 NOTE — Telephone Encounter (Signed)
 Requesting rx rf of Adderall XR 20mg   Last written 03/09/2024 Last OV 02/13/2024 Upcoming appt = none

## 2024-06-05 MED ORDER — AMPHETAMINE-DEXTROAMPHET ER 20 MG PO CP24
20.0000 mg | ORAL_CAPSULE | ORAL | 0 refills | Status: DC
Start: 2024-06-05 — End: 2024-07-16

## 2024-06-05 NOTE — Telephone Encounter (Signed)
 Pls call to schedule for end of Sept for f/u ADD.   Meds ordered this encounter  Medications   amphetamine -dextroamphetamine  (ADDERALL XR) 20 MG 24 hr capsule    Sig: Take 1 capsule (20 mg total) by mouth every morning.    Dispense:  30 capsule    Refill:  0

## 2024-06-12 ENCOUNTER — Ambulatory Visit: Payer: 59 | Admitting: Family Medicine

## 2024-06-27 ENCOUNTER — Other Ambulatory Visit: Payer: Self-pay | Admitting: Family Medicine

## 2024-06-27 DIAGNOSIS — F330 Major depressive disorder, recurrent, mild: Secondary | ICD-10-CM

## 2024-07-16 ENCOUNTER — Encounter: Payer: Self-pay | Admitting: Family Medicine

## 2024-07-16 ENCOUNTER — Ambulatory Visit (INDEPENDENT_AMBULATORY_CARE_PROVIDER_SITE_OTHER): Admitting: Family Medicine

## 2024-07-16 VITALS — BP 121/90 | HR 69 | Ht 72.0 in | Wt 283.0 lb

## 2024-07-16 DIAGNOSIS — G4733 Obstructive sleep apnea (adult) (pediatric): Secondary | ICD-10-CM

## 2024-07-16 DIAGNOSIS — R7989 Other specified abnormal findings of blood chemistry: Secondary | ICD-10-CM

## 2024-07-16 DIAGNOSIS — E785 Hyperlipidemia, unspecified: Secondary | ICD-10-CM

## 2024-07-16 DIAGNOSIS — F330 Major depressive disorder, recurrent, mild: Secondary | ICD-10-CM

## 2024-07-16 DIAGNOSIS — I10 Essential (primary) hypertension: Secondary | ICD-10-CM | POA: Diagnosis not present

## 2024-07-16 DIAGNOSIS — F902 Attention-deficit hyperactivity disorder, combined type: Secondary | ICD-10-CM | POA: Diagnosis not present

## 2024-07-16 DIAGNOSIS — Z6835 Body mass index (BMI) 35.0-35.9, adult: Secondary | ICD-10-CM

## 2024-07-16 MED ORDER — AMPHETAMINE-DEXTROAMPHET ER 20 MG PO CP24: 20.0000 mg | ORAL_CAPSULE | ORAL | 0 refills | Status: AC

## 2024-07-16 NOTE — Assessment & Plan Note (Signed)
 If you want to come off the cymbalta  we can decrease to every other day for 2 weeks and then stop.  Could be contributing to weight gain.   Flowsheet Row Office Visit from 12/13/2023 in [facility] at [facility]  PHQ-9 Total Score 6

## 2024-07-16 NOTE — Assessment & Plan Note (Signed)
  Attention-deficit hyperactivity disorder, combined type ADD medication effective, no adjustments needed.

## 2024-07-16 NOTE — Patient Instructions (Signed)
 If you want to come off the cymbalta  we can decrease to every other day for 2 weeks and then stop.

## 2024-07-16 NOTE — Assessment & Plan Note (Signed)
 BP ok but diastolic up a little. Will monitor.

## 2024-07-16 NOTE — Assessment & Plan Note (Addendum)
 Difficulty losing weight despite healthy lifestyle. Prefers natural methods. Concerns about cost and scheduling of structured programs. GLP-1 medications not preferred. - Consider tapering off Cymbalta  to assess impact on weight. - Revisit calorie tracking to ensure caloric intake aligns with weight loss goals. - Add in resistance training

## 2024-07-16 NOTE — Assessment & Plan Note (Signed)
 Hyperlipidemia Concern about cholesterol levels despite low-fat diet. Potential genetic component acknowledged. Weight loss important for reducing cardiovascular risk.

## 2024-07-16 NOTE — Progress Notes (Signed)
 Established Patient Office Visit  Subjective  Patient ID: Stephen Orozco, male    DOB: Oct 07, 1971  Age: 53 y.o. MRN: [id number]  Chief Complaint  Patient presents with   Medication Refill    HPI  Discussed the use of AI scribe software for clinical note transcription with the patient, who gave verbal consent to proceed.  History of Present Illness Stephen Orozco is a 53 year old male who presents with concerns about weight management and medication effects.  Weight management difficulty - Difficulty losing weight despite maintaining a healthy diet and regular physical activity, including hiking twice a week - Food intake recorded for one and a half months without identifying dietary issues contributing to weight gain - Minimal weight change of approximately seven pounds over the past six months - Frustration with inability to lose weight despite dietary changes, including reducing intake to manage bloating - Goal to lose fifty pounds to reach a weight previously maintained in college - Finds it challenging to achieve weight loss goal  Medication effects and polypharmacy concerns - Currently taking medication for ADD, which is effective for focus but does not aid in weight loss or increase energy levels - Desire to reduce medication burden, particularly concerned about the potential impact of medications such as Cymbalta  on weight  Gastrointestinal symptoms - Bloating managed by reducing onion intake - Low FODMAP diet found beneficial for reducing stomach bloating  Musculoskeletal pain - History of rotator cuff injury causing shoulder pain, especially when sleeping on his side - Shoulder pain limits ability to engage in resistance training  Hyperlipidemia - History of high cholesterol despite not consuming high-fat foods and having tried a plant-based diet - Family history of hyperlipidemia (mother affected) - Concerned about persistent elevated cholesterol  levels      ROS    Objective:     BP (!) 121/90 (BP Location: Left Arm, Patient Position: Sitting, Cuff Size: Normal)   Pulse 69   Ht 6' (1.829 m)   Wt 283 lb (128.4 kg)   SpO2 97%   BMI 38.38 kg/m    Physical Exam Vitals and nursing note reviewed.  Constitutional:      Appearance: Normal appearance.  HENT:     Head: Normocephalic and atraumatic.  Eyes:     Conjunctiva/sclera: Conjunctivae normal.  Cardiovascular:     Rate and Rhythm: Normal rate and regular rhythm.  Pulmonary:     Effort: Pulmonary effort is normal.     Breath sounds: Normal breath sounds.  Skin:    General: Skin is warm and dry.  Neurological:     Mental Status: He is alert.  Psychiatric:        Mood and Affect: Mood normal.      No results found for any visits on 07/16/24.    The 10-year ASCVD risk score (Arnett DK, et al., 2019) is: 7.8%    Assessment & Plan:   Problem List Items Addressed This Visit       Cardiovascular and Mediastinum   Essential hypertension, benign - Primary   BP ok but diastolic up a little. Will monitor.          Respiratory   OSA (obstructive sleep apnea)     Other   Severe obesity (BMI 35.0-35.9 with comorbidity) (HCC)   Difficulty losing weight despite healthy lifestyle. Prefers natural methods. Concerns about cost and scheduling of structured programs. GLP-1 medications not preferred. - Consider tapering off Cymbalta  to assess impact on weight. - Revisit calorie  tracking to ensure caloric intake aligns with weight loss goals. - Add in resistance training      Relevant Medications   amphetamine -dextroamphetamine  (ADDERALL XR) 20 MG 24 hr capsule   amphetamine -dextroamphetamine  (ADDERALL XR) 20 MG 24 hr capsule (Start on 08/13/2024)   amphetamine -dextroamphetamine  (ADDERALL XR) 20 MG 24 hr capsule (Start on 09/10/2024)   MDD (major depressive disorder)   If you want to come off the cymbalta  we can decrease to every other day for 2 weeks and  then stop.  Could be contributing to weight gain.   Flowsheet Row Office Visit from 12/13/2023 in [facility] Medicine at [facility]  PHQ-9 Total Score 6         Hyperlipidemia   Hyperlipidemia Concern about cholesterol levels despite low-fat diet. Potential genetic component acknowledged. Weight loss important for reducing cardiovascular risk.      ADHD    Attention-deficit hyperactivity disorder, combined type ADD medication effective, no adjustments needed.      Relevant Medications   amphetamine -dextroamphetamine  (ADDERALL XR) 20 MG 24 hr capsule   amphetamine -dextroamphetamine  (ADDERALL XR) 20 MG 24 hr capsule (Start on 08/13/2024)   amphetamine -dextroamphetamine  (ADDERALL XR) 20 MG 24 hr capsule (Start on 09/10/2024)   Other Visit Diagnoses       Low vitamin D  level           Assessment and Plan Assessment & Plan  General Health Maintenance Uncertain if physical exam completed this year, required for insurance. - Check if due for flu shot or other vaccinations. - Verify if physical exam completed this year.  Plan to schedule for this fall and we can get updated labs at that time.   Return in about 6 weeks (around 08/27/2024) for Wellness Exam and Labs .    Dorothyann Byars, MD

## 2024-07-18 ENCOUNTER — Other Ambulatory Visit: Payer: Self-pay | Admitting: Family Medicine

## 2024-07-18 DIAGNOSIS — E785 Hyperlipidemia, unspecified: Secondary | ICD-10-CM

## 2024-09-08 ENCOUNTER — Ambulatory Visit (INDEPENDENT_AMBULATORY_CARE_PROVIDER_SITE_OTHER): Admitting: Family Medicine

## 2024-09-08 ENCOUNTER — Encounter: Payer: Self-pay | Admitting: Family Medicine

## 2024-09-08 VITALS — BP 119/68 | HR 76 | Ht 72.0 in | Wt 288.1 lb

## 2024-09-08 DIAGNOSIS — Z125 Encounter for screening for malignant neoplasm of prostate: Secondary | ICD-10-CM

## 2024-09-08 DIAGNOSIS — Z23 Encounter for immunization: Secondary | ICD-10-CM

## 2024-09-08 DIAGNOSIS — Z0000 Encounter for general adult medical examination without abnormal findings: Secondary | ICD-10-CM

## 2024-09-08 NOTE — Progress Notes (Signed)
 Established Patient Office Visit  Patient ID: Stephen Orozco, male    DOB: 1971-02-27  Age: 53 y.o. MRN: [id number] PCP: Alvan Dorothyann BIRCH, MD  Chief Complaint  Patient presents with   Annual Exam    Subjective:     HPI  Discussed the use of AI scribe software for clinical note transcription with the patient, who gave verbal consent to proceed.  History of Present Illness Stephen Orozco is a 53 year old male who presents for a routine physical exam and to receive a pneumonia vaccination.  Immunization concerns - Concerned about potential side effects of the Prevnar pneumonia vaccine - Experienced significant discomfort with the shingles vaccine, particularly the second dose - No redness occurred with the shingles vaccine  Shoulder dysfunction, right - Ongoing shoulder issues limiting ability to reach into the back of the car or push up off the floor - Restriction in participation in physical activities  Sacrococcygeal discomfort - Discomfort at the tailbone, possibly due to a pilonidal cyst - Symptom alleviated by using a pillow when sitting  Sleep quality - Sleep is described as 'okay' at night  Physical activity - 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  HENT:     Head: Normocephalic and atraumatic.     Right Ear: Tympanic membrane, ear canal and external ear normal.     Left Ear: Tympanic membrane, ear canal and external ear normal.     Nose: Nose normal.     Mouth/Throat:     Pharynx: Oropharynx is clear.  Eyes:     Extraocular Movements: Extraocular movements intact.     Conjunctiva/sclera: Conjunctivae normal.     Pupils: Pupils are equal, round,  and reactive to light.  Neck:     Thyroid: No thyromegaly.  Cardiovascular:     Rate and Rhythm: Normal rate and regular rhythm.  Pulmonary:     Effort: Pulmonary effort is normal.     Breath sounds: Normal breath sounds.  Abdominal:     General: Bowel sounds are normal.     Palpations: Abdomen is soft.     Tenderness: There is no abdominal tenderness.  Musculoskeletal:        General: No swelling.     Cervical back: Neck supple.  Skin:    General: Skin is warm and dry.  Neurological:     Mental Status: He is oriented to person, place, and time.  Psychiatric:        Mood and Affect: Mood normal.        Behavior: Behavior normal.      No results found for any visits on 09/08/24.    The 10-year ASCVD risk score (Arnett DK, et al., 2019) is: 7.5%    Assessment & Plan:   Problem List Items Addressed This Visit   None Visit Diagnoses       Wellness examination    -  Primary   Relevant Orders   CMP14+EGFR   Lipid panel   CBC   PSA     Encounter for immunization       Relevant Orders  Flu vaccine trivalent PF, 6mos and older(Flulaval,Afluria,Fluarix,Fluzone) (Completed)   Pneumococcal conjugate vaccine 20-valent (Completed)     Screening for malignant neoplasm of prostate       Relevant Orders   PSA       Assessment and Plan Assessment & Plan Adult Wellness Visit Routine wellness visit with focus on health maintenance. - Ordered blood work.  Immunization: pneumococcal vaccination (Prevnar) Due for pneumococcal vaccination. Discussed mild side effects. - Administered Prevnar vaccine.  Shoulder pain, possible rotator cuff injury Chronic shoulder pain possibly due to rotator cuff injury affecting daily activities.  Possible pilonidal cyst Discussed surgical intervention, low risk but recovery uncomfortable.  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 - Refilled Adderall XR prescription.    Return in about 6 months  (around 03/08/2025) for Hypertension.    Dorothyann Byars, MD [facility] Care & [facility] Medicine at [facility]

## 2024-09-09 ENCOUNTER — Ambulatory Visit: Payer: Self-pay | Admitting: Family Medicine

## 2024-09-09 LAB — CBC
Hematocrit: 43.6 % (ref 37.5–51.0)
Hemoglobin: 14.9 g/dL (ref 13.0–17.7)
MCH: 31.7 pg (ref 26.6–33.0)
MCHC: 34.2 g/dL (ref 31.5–35.7)
MCV: 93 fL (ref 79–97)
Platelets: 178 x10E3/uL (ref 150–450)
RBC: 4.7 x10E6/uL (ref 4.14–5.80)
RDW: 13.1 % (ref 11.6–15.4)
WBC: 6.4 x10E3/uL (ref 3.4–10.8)

## 2024-09-09 LAB — CMP14+EGFR
ALT: 39 IU/L (ref 0–44)
AST: 30 IU/L (ref 0–40)
Albumin: 4.7 g/dL (ref 3.8–4.9)
Alkaline Phosphatase: 77 IU/L (ref 47–123)
BUN/Creatinine Ratio: 12 (ref 9–20)
BUN: 11 mg/dL (ref 6–24)
Bilirubin Total: 0.5 mg/dL (ref 0.0–1.2)
CO2: 24 mmol/L (ref 20–29)
Calcium: 9.4 mg/dL (ref 8.7–10.2)
Chloride: 97 mmol/L (ref 96–106)
Creatinine, Ser: 0.92 mg/dL (ref 0.76–1.27)
Globulin, Total: 2.4 g/dL (ref 1.5–4.5)
Glucose: 117 mg/dL — ABNORMAL HIGH (ref 70–99)
Potassium: 4.1 mmol/L (ref 3.5–5.2)
Sodium: 135 mmol/L (ref 134–144)
Total Protein: 7.1 g/dL (ref 6.0–8.5)
eGFR: 99 mL/min/1.73 (ref >59)

## 2024-09-09 LAB — LIPID PANEL
Chol/HDL Ratio: 6.4 ratio — ABNORMAL HIGH (ref 0.0–5.0)
Cholesterol, Total: 199 mg/dL (ref 100–199)
HDL: 31 mg/dL — ABNORMAL LOW (ref >39)
LDL Chol Calc (NIH): 125 mg/dL — ABNORMAL HIGH (ref 0–99)
Triglycerides: 243 mg/dL — ABNORMAL HIGH (ref 0–149)
VLDL Cholesterol Cal: 43 mg/dL — ABNORMAL HIGH (ref 5–40)

## 2024-09-09 LAB — PSA: Prostate Specific Ag, Serum: 0.3 ng/mL (ref 0.0–4.0)

## 2024-09-09 NOTE — Progress Notes (Signed)
 Hi Antwaun, metabolic panel overall looks good.  Your triglycerides and LDL are elevated.  Your LDL does look better this year compared to last year which is great.  So just continue to work on healthy Mediterranean diet and regular exercise.  Blood count is normal and no sign of anemia.  Prostate test is normal.  Do a risk calculator for your cardiovascular risk over the next 10 years and it is around 7.4%.  I would like for you to consider doing a cardiac calcium score it is like a mini CT of the coronary arteries to see if you may already have some plaque buildup.  We can always discuss it further at your next appointment 2 but just wanted to plant that seed and have you think about it.  The 10-year ASCVD risk score (Arnett DK, et al., 2019) is: 7.4%   Values used to calculate the score:     Age: 53 years     Clincally relevant sex: Male     Is Non-Hispanic African American: No     Diabetic: No     Tobacco smoker: No     Systolic Blood Pressure: 119 mmHg     Is BP treated: Yes     HDL Cholesterol: 31 mg/dL     Total Cholesterol: 199 mg/dL

## 2024-09-14 ENCOUNTER — Ambulatory Visit: Payer: BC Managed Care – PPO | Admitting: Dermatology

## 2024-09-15 ENCOUNTER — Ambulatory Visit: Payer: BC Managed Care – PPO | Admitting: Dermatology

## 2024-09-15 ENCOUNTER — Encounter: Payer: Self-pay | Admitting: Dermatology

## 2024-09-15 VITALS — BP 133/91 | HR 90

## 2024-09-15 DIAGNOSIS — D0461 Carcinoma in situ of skin of right upper limb, including shoulder: Secondary | ICD-10-CM | POA: Diagnosis not present

## 2024-09-15 DIAGNOSIS — D099 Carcinoma in situ, unspecified: Secondary | ICD-10-CM

## 2024-09-15 NOTE — Patient Instructions (Signed)

## 2024-09-15 NOTE — Progress Notes (Signed)
   Follow-Up Visit   Subjective  Stephen Orozco is a 53 y.o. male who presents for the following: EDC os SCC int Situ  Patient present today for follow up visit for [facility] of SCC in Situ. Patient was last evaluated on 09/10/2023. Patient denies medication changes.  Patient provided verbal consent for the use of an AI-assisted program to generate a detailed after-visit summary. The patient understands that the AI tool is used to support clinical documentation and that all information will be reviewed and verified by the healthcare provider.  The following portions of the chart were reviewed this encounter and updated as appropriate: medications, allergies, medical history  Review of Systems:  No other skin or systemic complaints except as noted in HPI or Assessment and Plan.  Objective  Well appearing patient in no apparent distress; mood and affect are within normal limits.  A focused examination was performed of the following areas: Right Shoulder  Relevant exam findings are noted in the Assessment and Plan.       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 Destruction of lesion Complexity: extensive   Destruction method: electrodesiccation and curettage   Informed consent: discussed and consent obtained   Timeout:  patient name, date of birth, surgical site, and procedure verified Procedure prep:  Patient was prepped and draped in usual sterile fashion Prep type:  Isopropyl alcohol Anesthesia: the lesion was anesthetized in a standard fashion   Anesthetic:  1% lidocaine  w/ epinephrine 1-100,000 buffered w/ 8.4% NaHCO3 Curettage performed in three different directions: Yes   Electrodesiccation performed over the curetted area: Yes   Curettage cycles:  2 Lesion length (cm):  1.5 Margin per side (cm):  0.3 Final wound size (cm):  2.1 Hemostasis achieved with:  pressure and electrodesiccation Outcome: patient tolerated procedure well  with no complications   Post-procedure details: sterile dressing applied and wound care instructions given   Dressing type: bandage and petrolatum     Return for TBSE (Next Avaliable for TBSE Erminio or Paci).  I, Jetta Ager, am acting as neurosurgeon for [employer], DO.  Documentation: I have reviewed the above documentation for accuracy and completeness, and I agree with the above.  Delon Lenis, DO

## 2024-09-15 NOTE — Progress Notes (Deleted)
   Follow-Up Visit  Patient (and/or pt guardian) consented to the use of AI-assisted tools for note generation.    Subjective  Stephen Orozco is a 53 y.o. male who presents for the following: ED&C of SCCIS on Right Shoulder  Patient was last evaluated on 09/09/24. At this visit a FBSE was performed as well as a bx on the right shoulder with a diagnosis of SCCIS Patient denies medication changes.  The following portions of the chart were reviewed this encounter and updated as appropriate: medications, allergies, medical history  Review of Systems:  No other skin or systemic complaints except as noted in HPI or Assessment and Plan.  Objective  Well appearing patient in no apparent distress; mood and affect are within normal limits.  A focused examination was performed of the following areas: Rihgt Shoulder  Relevant exam findings are noted in the Assessment and Plan.    Assessment & Plan        No follow-ups on file.  I, Lyle Cords, as acting as a neurosurgeon for [employer], DO .   Documentation: I have reviewed the above documentation for accuracy and completeness, and I agree with the above.  Delon Lenis, DO

## 2024-09-22 ENCOUNTER — Other Ambulatory Visit: Payer: Self-pay | Admitting: Family Medicine

## 2024-09-22 DIAGNOSIS — F330 Major depressive disorder, recurrent, mild: Secondary | ICD-10-CM

## 2024-09-23 ENCOUNTER — Other Ambulatory Visit: Payer: Self-pay | Admitting: Family Medicine

## 2024-09-23 DIAGNOSIS — I10 Essential (primary) hypertension: Secondary | ICD-10-CM

## 2024-10-17 ENCOUNTER — Encounter: Payer: Self-pay | Admitting: Family Medicine

## 2024-10-19 NOTE — Telephone Encounter (Signed)
 I would recommend 2 ibuprofen and 2 extra strength Tylenol.  He can take all of that at the same time and try to lay down and get some sleep and try to get some fluids then.  There is a lot of tightness or spasming in the neck as well work on some gentle range of motion and maybe even add some heat to the neck.

## 2024-10-19 NOTE — Telephone Encounter (Signed)
 Attempted call to patient. Left voice mail message with my direct # for return call. Also stated that I would forward him the message through Mychart.

## 2024-11-17 ENCOUNTER — Other Ambulatory Visit: Payer: Self-pay | Admitting: Family Medicine

## 2024-11-17 DIAGNOSIS — E785 Hyperlipidemia, unspecified: Secondary | ICD-10-CM

## 2024-12-08 ENCOUNTER — Ambulatory Visit: Admitting: Dermatology

## 2025-03-09 ENCOUNTER — Ambulatory Visit: Admitting: Family Medicine
